# Patient Record
Sex: Female | Born: 1995
Health system: Southern US, Community
[De-identification: ages and names within clinical notes are randomized; demographics above are authoritative.]

## PROBLEM LIST (undated history)

## (undated) ENCOUNTER — Inpatient Hospital Stay: Payer: Self-pay

## (undated) DIAGNOSIS — F1910 Other psychoactive substance abuse, uncomplicated: Secondary | ICD-10-CM

## (undated) DIAGNOSIS — R51 Headache: Secondary | ICD-10-CM

## (undated) DIAGNOSIS — O039 Complete or unspecified spontaneous abortion without complication: Secondary | ICD-10-CM

## (undated) DIAGNOSIS — R519 Headache, unspecified: Secondary | ICD-10-CM

## (undated) HISTORY — DX: Complete or unspecified spontaneous abortion without complication: O03.9

## (undated) HISTORY — DX: Headache: R51

## (undated) HISTORY — DX: Headache, unspecified: R51.9

## (undated) NOTE — *Deleted (*Deleted)
Physician Discharge Summary Note  Patient:  Jill Shepherd is an 62 y.o., female MRN:  [id number] DOB:  06-05-1995 Patient phone:  [phone] (home)  Patient address:   [address] [place] [address],  Total Time spent with patient: {Time; 15 min - 8 hours:17441}  Date of Admission:  12/07/2019 Date of Discharge: 12/23/2019  Reason for Admission:  Mr. Laureano is a  24y.o. female who was admitted to the [facility] unit for psychosis, agitation, suicidal ideations.  Principal Problem: Bipolar 1 disorder, depressed (HCC) Discharge Diagnoses: Principal Problem:   Bipolar 1 disorder, depressed (HCC) Active Problems:   Anxiety   Opiate abuse, continuous (HCC)   Methamphetamine abuse (HCC)   Amphetamine and psychostimulant-induced psychosis with hallucinations (HCC)   Past Psychiatric History: Past history of substance abuse and  bipolar disorder previously treated with seroquel and hydroxyzine. No previous suicide attempts. Multiple rehab treatments.   Past Medical History:  Past Medical History:  Diagnosis Date  . Drug abuse (HCC)   . Headache   . SAB (spontaneous abortion) 01/19/2014   stillborn at 20.4 wks    Past Surgical History:  Procedure Laterality Date  . INCISION AND DRAINAGE ABSCESS Left 05/07/2019   Procedure: INCISION AND DRAINAGE ABSCESS;  Surgeon: Leafy Ro, MD;  Location: [facility] ORS;  Service: General;  Laterality: Left;   Family History:  Family History  Problem Relation Age of Onset  . Hypertension Father   . Hypertension Paternal Grandfather    Family Psychiatric  History: : Mother with alcohol and substance abuse issues. Older brother with alcohol use disorder. Younger brother with polysubstance abuse with several prescription pills  Social History:  Social History   Substance and Sexual Activity  Alcohol Use No  . Alcohol/week: 0.0 standard drinks     Social History   Substance and Sexual Activity  Drug Use Yes  . Types: Other-see  comments, Amphetamines, Fentanyl, Heroin   Comment: subutex tx, snorted heroin last on 5/8    Social History   Socioeconomic History  . Marital status: Legally Separated    Spouse name: Not on file  . Number of children: 1  . Years of education: Not on file  . Highest education level: Not on file  Occupational History    Employer: [employer]  Tobacco Use  . Smoking status: Never Smoker  . Smokeless tobacco: Never Used  Vaping Use  . Vaping Use: Never used  Substance and Sexual Activity  . Alcohol use: No    Alcohol/week: 0.0 standard drinks  . Drug use: Yes    Types: Other-see comments, Amphetamines, Fentanyl, Heroin    Comment: subutex tx, snorted heroin last on 5/8  . Sexual activity: Yes    Partners: Male    Birth control/protection: None  Other Topics Concern  . Not on file  Social History Narrative  . Not on file   Social Determinants of Health   Financial Resource Strain:   . Difficulty of Paying Living Expenses: Not on file  Food Insecurity:   . Worried About Programme researcher, broadcasting/film/video in the Last Year: Not on file  . Ran Out of Food in the Last Year: Not on file  Transportation Needs:   . Lack of Transportation (Medical): Not on file  . Lack of Transportation (Non-Medical): Not on file  Physical Activity:   . Days of Exercise per Week: Not on file  . Minutes of Exercise per Session: Not on file  Stress:   .  Feeling of Stress : Not on file  Social Connections:   . Frequency of Communication with Friends and Family: Not on file  . Frequency of Social Gatherings with Friends and Family: Not on file  . Attends Religious Services: Not on file  . Active Member of Clubs or Organizations: Not on file  . Attends Banker Meetings: Not on file  . Marital Status: Not on file    Hospital Course:  19 year old female who presented to hospital under IVC for acute psychosis, agitation, and verbalization of suicidal ideation to mother. While in the ED, patient was  able to sober and consent for voluntary admission to our unit. On admission she was very labile, tearful, and unable to sleep. She was restarted on Seroquel and this was titrated to 200 mg QHS to assist with mood and insomnia. She also was noted to have restless leg syndrome in the hospital. Iron studies completed and within normal limits. She was started on Requip 0.25 mg nightly which resolved her symptoms. She was also noted to have hepatitis C on admission, and will need to follow-up with infectious disease on an outpatient basis for treatment. At time of discharge she denied suicidal ideations, homicidal ideations, visual hallucinations, and auditory hallucinations. She was accepted to [facility] for a 13-month substance abuse treatment program. Patient, patient's mother, and treatment team felt she was safe to discharge with outpatient follow-up.   Physical Findings: AIMS: Facial and Oral Movements Muscles of Facial Expression: None, normal Lips and Perioral Area: None, normal Jaw: None, normal Tongue: None, normal,Extremity Movements Upper (arms, wrists, hands, fingers): None, normal Lower (legs, knees, ankles, toes): None, normal, Trunk Movements Neck, shoulders, hips: None, normal, Overall Severity Severity of abnormal movements (highest score from questions above): None, normal Incapacitation due to abnormal movements: None, normal Patient's awareness of abnormal movements (rate only patient's report): No Awareness, Dental Status Current problems with teeth and/or dentures?: No Does patient usually wear dentures?: No  CIWA:    COWS:     Musculoskeletal: Strength & Muscle Tone: {desc; muscle tone:32375} Gait & Station: {PE GAIT ED VHQI:69629} Patient leans: {Patient Leans:21022755}  Psychiatric Specialty Exam: Physical Exam  Review of Systems  Blood pressure 98/82, pulse 70, temperature 97.8 F (36.6 C), temperature source Oral, resp. rate 18, height 5\' 1"  (1.549 m), weight 105 kg,  last menstrual period 12/01/2019, SpO2 100 %, unknown if currently breastfeeding.Body mass index is 43.74 kg/m.  General Appearance: {Appearance:22683}  Eye Contact:  {BHH EYE CONTACT:22684}  Speech:  {Speech:22685}  Volume:  {Volume (PAA):22686}  Mood:  {BHH MOOD:22306}  Affect:  {Affect (PAA):22687}  Thought Process:  {Thought Process (PAA):22688}  Orientation:  {BHH ORIENTATION (PAA):22689}  Thought Content:  {Thought Content:22690}  Suicidal Thoughts:  {ST/HT (PAA):22692}  Homicidal Thoughts:  {ST/HT (PAA):22692}  Memory:  {BHH MEMORY:22881}  Judgement:  {Judgement (PAA):22694}  Insight:  {Insight (PAA):22695}  Psychomotor Activity:  {Psychomotor (PAA):22696}  Concentration:  {Concentration:21399}  Recall:  {BHH GOOD/FAIR/POOR:22877}  Fund of Knowledge:  {BHH GOOD/FAIR/POOR:22877}  Language:  {BHH GOOD/FAIR/POOR:22877}  Akathisia:  {BHH YES OR NO:22294}  Handed:  {Handed:22697}  AIMS (if indicated):     Assets:  {Assets (PAA):22698}  ADL's:  {BHH BMW'U:13244}  Cognition:  {chl bhh cognition:304700322}  Sleep:  Number of Hours: 6.25     Have you used any form of tobacco in the last 30 days? (Cigarettes, Smokeless Tobacco, Cigars, and/or Pipes): No  Has this patient used any form of tobacco in the last 30 days? (Cigarettes,  Smokeless Tobacco, Cigars, and/or Pipes) No  Blood Alcohol level:  Lab Results  Component Value Date   ETH <10 12/06/2019   ETH <10 12/01/2019    Metabolic Disorder Labs:  No results found for: HGBA1C, MPG No results found for: PROLACTIN No results found for: CHOL, TRIG, HDL, CHOLHDL, VLDL, LDLCALC  See Psychiatric Specialty Exam and Suicide Risk Assessment completed by Attending Physician prior to discharge.  Discharge destination:  Other:  [facility]  Is patient on multiple antipsychotic therapies at discharge:  No   Has Patient had three or more failed trials of antipsychotic monotherapy by history:  No  Recommended Plan for Multiple  Antipsychotic Therapies: NA   Allergies as of 12/21/2019   No Known Allergies   Med Rec must be completed prior to using this [facility]***       Follow-up Information    [facility] Follow up.   Specialty: Addiction Medicine Contact information: [address] [place] [address] [phone]        [facility] Follow up.   Why: Pt has been reviewed, awaiting bed date. No estimated beddate (12/19/19). Contact information: [address] [place] [address] [phone]        [facility] Follow up.   Contact information: [address] [place], [place] [address] [phone]       [facility] Follow up.   Why: Application sent 12/19/2019 Contact information: [address] [place] [address] [phone]               Follow-up recommendations:  Activity:  as tolerated Diet:  regular diet  Comments: 30 day supply of medications provided at discharge to patient.  Please contact infectious disease at [phone] to schedule appointment for treatment of Hepatitis C infection.   Signed: Jesse Sans, MD 12/21/2019, 1:30 PM

---
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
## 2016-03-30 ENCOUNTER — Emergency Department
Admission: EM | Admit: 2016-03-30 | Discharge: 2016-03-30 | Disposition: A | Discharge status: Home / Self Care | Payer: Medicaid Other | Attending: Emergency Medicine | Admitting: Emergency Medicine

## 2016-03-30 ENCOUNTER — Encounter: Payer: Self-pay | Admitting: Emergency Medicine

## 2016-03-30 DIAGNOSIS — L249 Irritant contact dermatitis, unspecified cause: Secondary | ICD-10-CM

## 2016-03-30 DIAGNOSIS — L243 Irritant contact dermatitis due to cosmetics: Secondary | ICD-10-CM | POA: Insufficient documentation

## 2016-03-30 MED ORDER — TRIAMCINOLONE ACETONIDE 0.025 % EX OINT: 1.0000 "application " | TOPICAL_OINTMENT | Freq: Two times a day (BID) | CUTANEOUS | 0 refills | Status: AC

## 2016-03-30 NOTE — ED Provider Notes (Signed)
[facility] Emergency Department Provider Note  ____________________________________________  Time seen: Approximately 10:42 PM  I have reviewed the triage vital signs and the nursing notes.   HISTORY  Chief Complaint Rash   HPI Jill Shepherd is a 21 y.o. female presenting to the emergency department with a fine macular rash of the skin overlying her chest, abdomen and back. Patient states that she has been tanning in a tanning bed almost daily for the past week. Patient states that she has used a new tanning lotion. Patient denies facial swelling, shortness of breath, chest pain, abdominal pain, nausea and vomiting. She has been afebrile. No recent illness. No alleviating measures have been attempted.   Past Medical History:  Diagnosis Date  . Headache   . SAB (spontaneous abortion) 01/19/2014   stillborn at 20.4 wks    Patient Active Problem List   Diagnosis Date Noted  . Preterm premature rupture of membranes 09/25/2015  . Premature cervical dilation in third trimester 09/07/2015  . Labor and delivery, indication for care 09/07/2015  . Supervision of high risk pregnancy in third trimester 06/06/2015  . History of pregnancy loss in prior pregnancy, currently pregnant in second trimester 05/14/2015  . History of drug use 05/14/2015  . Underweight 05/14/2015    History reviewed. No pertinent surgical history.  Prior to Admission medications   Medication Sig Start Date End Date Taking? Authorizing Provider  norelgestromin-ethinyl estradiol (ORTHO EVRA) 150-35 MCG/24HR transdermal patch Place 1 patch onto the skin once a week. 11/08/15   Hildred LaserAnika Cherry, MD  triamcinolone (KENALOG) 0.025 % ointment Apply 1 application topically 2 (two) times daily. 03/30/16 04/04/16  Orvil FeilJaclyn M Akbar Sacra, PA-C    Allergies Patient has no known allergies.  Family History  Problem Relation Age of Onset  . Hypertension Father   . Hypertension Paternal Grandfather     Social  History Social History  Substance Use Topics  . Smoking status: Never Smoker  . Smokeless tobacco: Never Used  . Alcohol use No    Review of Systems  Constitutional: No fever/chills Eyes: No visual changes. No discharge ENT: No upper respiratory complaints. Cardiovascular: no chest pain. Respiratory: no cough. No SOB. Gastrointestinal: No abdominal pain.  No nausea, no vomiting.  No diarrhea.  No constipation. Musculoskeletal: Negative for musculoskeletal pain. Skin: Patient has rash. Neurological: Negative for headaches, focal weakness or numbness. ____________________________________________   PHYSICAL EXAM:  VITAL SIGNS: ED Triage Vitals  Enc Vitals Group     BP 03/30/16 2054 111/68     Pulse Rate 03/30/16 2054 65     Resp 03/30/16 2054 18     Temp 03/30/16 2054 98.7 F (37.1 C)     Temp Source 03/30/16 2054 Oral     SpO2 03/30/16 2054 100 %     Weight 03/30/16 2054 93 lb (42.2 kg)     Height 03/30/16 2054 5\' 2"  (1.575 m)     Head Circumference --      Peak Flow --      Pain Score 03/30/16 2055 0     Pain Loc --      Pain Edu? --      Excl. in GC? --     Constitutional: Alert and oriented. Well appearing and in no acute distress. Eyes: Conjunctivae are normal. PERRL. EOMI. Head: Atraumatic. ENT:      Mouth/Throat: Mucous membranes are moist. No angioedema or facial swelling. Cardiovascular: Normal rate, regular rhythm. Normal S1 and S2.  Good peripheral circulation.  Respiratory: Normal respiratory effort without tachypnea or retractions. Lungs CTAB. Good air entry to the bases with no decreased or absent breath sounds. Gastrointestinal: Bowel sounds 4 quadrants. Soft and nontender to palpation. No guarding or rigidity. No palpable masses. No distention. No CVA tenderness. Musculoskeletal: Full range of motion to all extremities. No gross deformities appreciated. Neurologic:  Normal speech and language. No gross focal neurologic deficits are appreciated.  Skin:  Patient has a fine, macular rash of the skin overlying the chest, abdomen and back. Psychiatric: Mood and affect are normal. Speech and behavior are normal. Patient exhibits appropriate insight and judgement. ___________________________________________   LABS (all labs ordered are listed, but only abnormal results are displayed)  Labs Reviewed - No data to display ____________________________________________  EKG   ____________________________________________  RADIOLOGY Geraldo Pitter, personally viewed and evaluated these images (plain radiographs) as part of my medical decision making, as well as reviewing the written report by the radiologist.  No results found.  ____________________________________________    PROCEDURES  Procedure(s) performed:    Procedures    Medications - No data to display   ____________________________________________   INITIAL IMPRESSION / ASSESSMENT AND PLAN / ED COURSE  Pertinent labs & imaging results that were available during my care of the patient were reviewed by me and considered in my medical decision making (see chart for details).  Review of the [place] CSRS was performed in accordance of the [facility] prior to dispensing any controlled drugs.     Contact dermatitis: Assessment and Plan:  Patient presents to the emergency department with a fine, macular rash of the skin overlying the chest, abdomen and back. I suspect contact dermatitis from tanning bed exposure/tanning lotion. Patient was discharged with triamcinolone cream. Vital signs are reassuring at this time. All patient questions were answered. ____________________________________________  FINAL CLINICAL IMPRESSION(S) / ED DIAGNOSES  Final diagnoses:  Irritant contact dermatitis, unspecified trigger      NEW MEDICATIONS STARTED DURING THIS VISIT:  New Prescriptions   TRIAMCINOLONE (KENALOG) 0.025 % OINTMENT    Apply 1 application topically 2 (two) times daily.         This chart was dictated using voice recognition software/Dragon. Despite best efforts to proofread, errors can occur which can change the meaning. Any change was purely unintentional.    Orvil Feil, PA-C 03/30/16 2252    Phineas Semen, MD 03/30/16 2257

## 2016-03-30 NOTE — ED Notes (Signed)
See triage note. Pt c/o itchy rash following tanning salon visit to chest, abdomen, and back. Pt thinks it may be from a new tanning oil. No resp distress.

## 2016-03-30 NOTE — ED Notes (Signed)

## 2016-03-30 NOTE — ED Triage Notes (Signed)
Pt ambulatory to triage with no difficulty. Pt reports she has noticed a worsening rash over the last few weeks since she started using a new coconut oil at the tanning bed. Pt talking in full and complete sentences with no difficulty.

## 2016-05-22 ENCOUNTER — Encounter: Payer: Self-pay | Admitting: *Deleted

## 2016-05-22 ENCOUNTER — Emergency Department
Admission: EM | Admit: 2016-05-22 | Discharge: 2016-05-22 | Disposition: A | Discharge status: Home / Self Care | Payer: Self-pay | Attending: Student in an Organized Health Care Education/Training Program | Admitting: Student in an Organized Health Care Education/Training Program

## 2016-05-22 ENCOUNTER — Emergency Department: Payer: Self-pay

## 2016-05-22 DIAGNOSIS — Z3A01 Less than 8 weeks gestation of pregnancy: Secondary | ICD-10-CM | POA: Insufficient documentation

## 2016-05-22 DIAGNOSIS — O200 Threatened abortion: Secondary | ICD-10-CM | POA: Insufficient documentation

## 2016-05-22 DIAGNOSIS — N939 Abnormal uterine and vaginal bleeding, unspecified: Secondary | ICD-10-CM

## 2016-05-22 LAB — URINALYSIS, COMPLETE (UACMP) WITH MICROSCOPIC
BACTERIA UA: NONE SEEN
Bilirubin Urine: NEGATIVE
Glucose, UA: NEGATIVE mg/dL
Ketones, ur: NEGATIVE mg/dL
Leukocytes, UA: NEGATIVE
Nitrite: NEGATIVE
PH: 5 (ref 5.0–8.0)
PROTEIN: NEGATIVE mg/dL
Specific Gravity, Urine: 1.017 (ref 1.005–1.030)

## 2016-05-22 LAB — BASIC METABOLIC PANEL
Anion gap: 8 (ref 5–15)
BUN: 15 mg/dL (ref 6–20)
CALCIUM: 9.7 mg/dL (ref 8.9–10.3)
CO2: 24 mmol/L (ref 22–32)
CREATININE: 0.69 mg/dL (ref 0.44–1.00)
Chloride: 106 mmol/L (ref 101–111)
GFR calc Af Amer: >60 mL/min (ref >60)
GLUCOSE: 130 mg/dL — AB (ref 65–99)
POTASSIUM: 4.2 mmol/L (ref 3.5–5.1)
SODIUM: 138 mmol/L (ref 135–145)

## 2016-05-22 LAB — CBC WITH DIFFERENTIAL/PLATELET
Basophils Absolute: 0 10*3/uL (ref 0–0.1)
Basophils Relative: 0 %
EOS ABS: 0.1 10*3/uL (ref 0–0.7)
EOS PCT: 1 %
HCT: 37.8 % (ref 35.0–47.0)
Hemoglobin: 13.2 g/dL (ref 12.0–16.0)
LYMPHS ABS: 1.6 10*3/uL (ref 1.0–3.6)
Lymphocytes Relative: 23 %
MCH: 29.7 pg (ref 26.0–34.0)
MCHC: 34.9 g/dL (ref 32.0–36.0)
MCV: 85 fL (ref 80.0–100.0)
MONO ABS: 0.4 10*3/uL (ref 0.2–0.9)
Monocytes Relative: 6 %
Neutro Abs: 4.7 10*3/uL (ref 1.4–6.5)
Neutrophils Relative %: 70 %
PLATELETS: 180 10*3/uL (ref 150–440)
RBC: 4.45 MIL/uL (ref 3.80–5.20)
RDW: 13.2 % (ref 11.5–14.5)
WBC: 6.7 10*3/uL (ref 3.6–11.0)

## 2016-05-22 LAB — POCT PREGNANCY, URINE: Preg Test, Ur: POSITIVE — AB

## 2016-05-22 LAB — HCG, QUANTITATIVE, PREGNANCY: HCG, BETA CHAIN, QUANT, S: 317 m[IU]/mL — AB (ref <5)

## 2016-05-22 MED ORDER — ACETAMINOPHEN 500 MG PO TABS
1000.0000 mg | ORAL_TABLET | Freq: Once | ORAL | Status: AC
  Administered 2016-05-22: 1000 mg via ORAL
  Filled 2016-05-22: qty 2

## 2016-05-22 NOTE — ED Notes (Signed)
Pt states she is 3-[redacted] weeks pregnant and began spotting a week ago, states she is "high risk" for pregnancy due to 2 pre-term deliveries in the past, awake and alert in no acute distress

## 2016-05-22 NOTE — ED Notes (Signed)
Pt discharged to home.  Family member driving.  Discharge instructions reviewed.  Verbalized understanding.  No questions or concerns at this time.  Teach back verified.  Pt in NAD.  No items left in ED.   

## 2016-05-22 NOTE — ED Provider Notes (Signed)
[facility] Emergency Department Provider Note    First MD Initiated Contact with Patient 05/22/16 1620     (approximate)  I have reviewed the triage vital signs and the nursing notes.   HISTORY  Chief Complaint Vaginal Bleeding    HPI Jill Shepherd is a 21 y.o. female G2P0201 presents with vaginal spotting for roughly 5-6 days with increase in bleeding and passing clots today. She states that she's roughly 3-[redacted] weeks pregnant by last menstrual period. Denies any severe pain.  Has not felt lightheaded or that she was about to pass out. Has not noted any products of conception. It was told she is a high risk for pregnancy because she's had 2 preterm deliveries.      Past Medical History:  Diagnosis Date  . Headache   . SAB (spontaneous abortion) 01/19/2014   stillborn at 20.4 wks   Family History  Problem Relation Age of Onset  . Hypertension Father   . Hypertension Paternal Grandfather    No past surgical history on file. Patient Active Problem List   Diagnosis Date Noted  . Preterm premature rupture of membranes 09/25/2015  . Premature cervical dilation in third trimester 09/07/2015  . Labor and delivery, indication for care 09/07/2015  . Supervision of high risk pregnancy in third trimester 06/06/2015  . History of pregnancy loss in prior pregnancy, currently pregnant in second trimester 05/14/2015  . History of drug use 05/14/2015  . Underweight 05/14/2015      Prior to Admission medications   Medication Sig Start Date End Date Taking? Authorizing Provider  norelgestromin-ethinyl estradiol (ORTHO EVRA) 150-35 MCG/24HR transdermal patch Place 1 patch onto the skin once a week. 11/08/15   Hildred Laser, MD    Allergies Patient has no known allergies.    Social History Social History  Substance Use Topics  . Smoking status: Never Smoker  . Smokeless tobacco: Never Used  . Alcohol use No    Review of Systems Patient denies  headaches, rhinorrhea, blurry vision, numbness, shortness of breath, chest pain, edema, cough, abdominal pain, nausea, vomiting, diarrhea, dysuria, fevers, rashes or hallucinations unless otherwise stated above in HPI. ____________________________________________   PHYSICAL EXAM:  VITAL SIGNS: Vitals:   05/22/16 1514  BP: 126/85  Pulse: 81  Resp: 18  Temp: 98.4 F (36.9 C)    Constitutional: Alert and oriented.tearful but in no acute distress. Eyes: Conjunctivae are normal. PERRL. EOMI. Head: Atraumatic. Nose: No congestion/rhinnorhea. Mouth/Throat: Mucous membranes are moist.  Oropharynx non-erythematous. Neck: No stridor. Painless ROM. No cervical spine tenderness to palpation Hematological/Lymphatic/Immunilogical: No cervical lymphadenopathy. Cardiovascular: Normal rate, regular rhythm. Grossly normal heart sounds.  Good peripheral circulation. Respiratory: Normal respiratory effort.  No retractions. Lungs CTAB. Gastrointestinal: Soft and nontender. No distention. No abdominal bruits. No CVA tenderness. Genitourinary: open cervical os with no POC visualized.  Small volume blood in the vaginal vault. Musculoskeletal: No lower extremity tenderness nor edema.  No joint effusions. Neurologic:  Normal speech and language. No gross focal neurologic deficits are appreciated. No gait instability. Skin:  Skin is warm, dry and intact. No rash noted. Psychiatric: Mood and affect are normal. Speech and behavior are normal.  ____________________________________________   LABS (all labs ordered are listed, but only abnormal results are displayed)  Results for orders placed or performed during the hospital encounter of 05/22/16 (from the past 24 hour(s))  CBC with Differential     Status: None   Collection Time: 05/22/16  3:15 PM  Result Value  Ref Range   WBC 6.7 3.6 - 11.0 K/uL   RBC 4.45 3.80 - 5.20 MIL/uL   Hemoglobin 13.2 12.0 - 16.0 g/dL   HCT 16.1 09.6 - 04.5 %   MCV 85.0 80.0  - 100.0 fL   MCH 29.7 26.0 - 34.0 pg   MCHC 34.9 32.0 - 36.0 g/dL   RDW 40.9 81.1 - 91.4 %   Platelets 180 150 - 440 K/uL   Neutrophils Relative % 70 %   Neutro Abs 4.7 1.4 - 6.5 K/uL   Lymphocytes Relative 23 %   Lymphs Abs 1.6 1.0 - 3.6 K/uL   Monocytes Relative 6 %   Monocytes Absolute 0.4 0.2 - 0.9 K/uL   Eosinophils Relative 1 %   Eosinophils Absolute 0.1 0 - 0.7 K/uL   Basophils Relative 0 %   Basophils Absolute 0.0 0 - 0.1 K/uL  Basic metabolic panel     Status: Abnormal   Collection Time: 05/22/16  3:15 PM  Result Value Ref Range   Sodium 138 135 - 145 mmol/L   Potassium 4.2 3.5 - 5.1 mmol/L   Chloride 106 101 - 111 mmol/L   CO2 24 22 - 32 mmol/L   Glucose, Bld 130 (H) 65 - 99 mg/dL   BUN 15 6 - 20 mg/dL   Creatinine, Ser 7.82 0.44 - 1.00 mg/dL   Calcium 9.7 8.9 - 95.6 mg/dL   GFR calc non Af Amer >60 >60 mL/min   GFR calc Af Amer >60 >60 mL/min   Anion gap 8 5 - 15  Urinalysis, Complete w Microscopic     Status: Abnormal   Collection Time: 05/22/16  3:22 PM  Result Value Ref Range   Color, Urine YELLOW (A) YELLOW   APPearance CLEAR (A) CLEAR   Specific Gravity, Urine 1.017 1.005 - 1.030   pH 5.0 5.0 - 8.0   Glucose, UA NEGATIVE NEGATIVE mg/dL   Hgb urine dipstick SMALL (A) NEGATIVE   Bilirubin Urine NEGATIVE NEGATIVE   Ketones, ur NEGATIVE NEGATIVE mg/dL   Protein, ur NEGATIVE NEGATIVE mg/dL   Nitrite NEGATIVE NEGATIVE   Leukocytes, UA NEGATIVE NEGATIVE   RBC / HPF 0-5 0 - 5 RBC/hpf   WBC, UA 0-5 0 - 5 WBC/hpf   Bacteria, UA NONE SEEN NONE SEEN   Squamous Epithelial / LPF 0-5 (A) NONE SEEN  hCG, quantitative, pregnancy     Status: Abnormal   Collection Time: 05/22/16  3:22 PM  Result Value Ref Range   hCG, Beta Chain, Quant, S 317 (H) <5 mIU/mL  Pregnancy, urine POC     Status: Abnormal   Collection Time: 05/22/16  3:25 PM  Result Value Ref Range   Preg Test, Ur POSITIVE (A) NEGATIVE    ____________________________________________  EKG ____________________________________________  RADIOLOGY  I personally reviewed all radiographic images ordered to evaluate for the above acute complaints and reviewed radiology reports and findings.  These findings were personally discussed with the patient.  Please see medical record for radiology report.  ____________________________________________   PROCEDURES  Procedure(s) performed:  Procedures    Critical Care performed: no ____________________________________________   INITIAL IMPRESSION / ASSESSMENT AND PLAN / ED COURSE  Pertinent labs & imaging results that were available during my care of the patient were reviewed by me and considered in my medical decision making (see chart for details).  DDX: ectopic, miscarriage, subchorionic hemorrhage  Gudrun Axe Tyree is a 21 y.o. who presents to the ED with vaginal bleeding and pregnancy. Patient  is AFVSS in ED. Exam as above. Given current presentation have considered the above differential.  Ultrasound ordered to evaluate for evidence of ectopic shows no identifiable intrauterine pregnancy. No evidence of ectopic. Will perform pelvic exam to evaluate for evidence of open cervical os or products of conception.  Clinical Course as of May 23 1910  Thu May 22, 2016  1908 Pelvic exam with open cervical os. There is no proximal of conception in the vault or the os suggesting a miscarriage. Discussed need for follow-up for repeat hCG and follow-up with OB/GYN.  Have discussed with the patient and available family all diagnostics and treatments performed thus far and all questions were answered to the best of my ability. The patient demonstrates understanding and agreement with plan.   [PR]    Clinical Course User Index [PR] Willy Eddy, MD     ____________________________________________   FINAL CLINICAL IMPRESSION(S) / ED DIAGNOSES  Final diagnoses:  Vaginal  bleeding  Threatened miscarriage      NEW MEDICATIONS STARTED DURING THIS VISIT:  New Prescriptions   No medications on file     Note:  This document was prepared using Dragon voice recognition software and may include unintentional dictation errors.    Willy Eddy, MD 05/22/16 [phone]

## 2016-05-22 NOTE — ED Triage Notes (Signed)
Pt reports vaginal spotting several days and now heavy vaginal bleeding.  Pt had 5 positive home pregnancy test.  Pt has not seen OB.  No urinary sx.   Pt alert

## 2016-05-22 NOTE — Discharge Instructions (Signed)
Please follow up with OB/Gyn.  Return for worsening pain or heavy bleeding where you are going through more than 4 tampons or pads per hour for more than 4 hours.

## 2016-06-24 ENCOUNTER — Ambulatory Visit (INDEPENDENT_AMBULATORY_CARE_PROVIDER_SITE_OTHER): Payer: Self-pay | Admitting: Obstetrics and Gynecology

## 2016-06-24 ENCOUNTER — Encounter: Payer: Self-pay | Admitting: Obstetrics and Gynecology

## 2016-06-24 VITALS — BP 92/51 | HR 62 | Ht 62.0 in | Wt 91.2 lb

## 2016-06-24 DIAGNOSIS — Z3202 Encounter for pregnancy test, result negative: Secondary | ICD-10-CM

## 2016-06-24 DIAGNOSIS — N926 Irregular menstruation, unspecified: Secondary | ICD-10-CM

## 2016-06-24 DIAGNOSIS — Z8759 Personal history of other complications of pregnancy, childbirth and the puerperium: Secondary | ICD-10-CM

## 2016-06-24 NOTE — Progress Notes (Signed)
    GYNECOLOGY CLINIC PROGRESS NOTE  Subjective:    Jill Shepherd is a 21 y.o. 603P0210 female who presents for evaluation of amenorrhea. She believes she could be pregnant. Pregnancy is desired. Sexual Activity: single partner, contraception: none. Current symptoms also include: pelvic cramping, breast tenderness, fatigue, nausea and positive home pregnancy test (patient reports 5 positive tests, with most recent test being 1-2 days ago) . Last period was abnormal.  Patient notes that last cycle was sometime during the 1st week of March.  Had bleeding towards the end of March into early April but patient notes she thinks she had a miscarriage around this time (was approximately 3-[redacted] weeks pregnant).  Did not have a cycle in April, or May.    No LMP recorded (lmp unknown). Sometime in 1st week of March.  Contraception: Previously on Ortho Evra patch (however has not been on these for several months)  The following portions of the patient's history were reviewed and updated as appropriate: allergies, current medications, past family history, past medical history, past social history, past surgical history and problem list.  Review of Systems Pertinent items noted in HPI and remainder of comprehensive ROS otherwise negative.      Objective:    BP (!) 92/51   Pulse 62   Ht 5\' 2"  (1.575 m)   Wt 91 lb 3 oz (41.4 kg)   LMP  (LMP Unknown)   BMI 16.68 kg/m  General: alert, no distress and no acute distress    Lab Review Urine HCG: negative    Assessment:    Absence of menstruation.     Plan:   Pregnancy Test: Negative.  Patient notes recent positive home pregnancy test.  Will get serum BHCG.  Discussed that this could be a new early pregnancy vs remaining HCG levels from previous miscarriage.  Will repeat HCG in 2 days to follow trend.  Advised on Tylenol prn and warm compress for cramping.    Hildred Laserherry, Odalys Win, MD [facility]

## 2016-06-24 NOTE — Progress Notes (Signed)
Pt states she had 5 home positive pregnancy test. Had a miscarriage about a month ago. UPT neg

## 2016-06-25 ENCOUNTER — Telehealth: Payer: Self-pay | Admitting: Obstetrics and Gynecology

## 2016-06-25 LAB — POCT URINE PREGNANCY: Preg Test, Ur: NEGATIVE

## 2016-06-25 LAB — BETA HCG QUANT (REF LAB): hCG Quant: 2 m[IU]/mL

## 2016-06-25 NOTE — Telephone Encounter (Signed)
Patient called requesting beta results from yesterday. Thanks °

## 2016-06-25 NOTE — Telephone Encounter (Signed)
Called pt informed her of negative beta results. Pt gave verbal understanding

## 2016-06-26 ENCOUNTER — Other Ambulatory Visit: Payer: Self-pay

## 2016-11-09 ENCOUNTER — Emergency Department
Admission: EM | Admit: 2016-11-09 | Discharge: 2016-11-09 | Disposition: A | Discharge status: Home / Self Care | Payer: Self-pay | Attending: Emergency Medicine | Admitting: Emergency Medicine

## 2016-11-09 ENCOUNTER — Emergency Department: Payer: Self-pay

## 2016-11-09 ENCOUNTER — Encounter: Payer: Self-pay | Admitting: *Deleted

## 2016-11-09 DIAGNOSIS — Z8759 Personal history of other complications of pregnancy, childbirth and the puerperium: Secondary | ICD-10-CM | POA: Insufficient documentation

## 2016-11-09 DIAGNOSIS — Z79899 Other long term (current) drug therapy: Secondary | ICD-10-CM | POA: Insufficient documentation

## 2016-11-09 DIAGNOSIS — R1030 Lower abdominal pain, unspecified: Secondary | ICD-10-CM | POA: Insufficient documentation

## 2016-11-09 LAB — COMPREHENSIVE METABOLIC PANEL
ALT: 15 U/L (ref 14–54)
AST: 27 U/L (ref 15–41)
Albumin: 4.9 g/dL (ref 3.5–5.0)
Alkaline Phosphatase: 64 U/L (ref 38–126)
Anion gap: 9 (ref 5–15)
BUN: 17 mg/dL (ref 6–20)
CHLORIDE: 103 mmol/L (ref 101–111)
CO2: 27 mmol/L (ref 22–32)
CREATININE: 0.82 mg/dL (ref 0.44–1.00)
Calcium: 9.7 mg/dL (ref 8.9–10.3)
GFR calc non Af Amer: >60 mL/min (ref >60)
Glucose, Bld: 70 mg/dL (ref 65–99)
Potassium: 3.6 mmol/L (ref 3.5–5.1)
SODIUM: 139 mmol/L (ref 135–145)
Total Bilirubin: 1 mg/dL (ref 0.3–1.2)
Total Protein: 8.1 g/dL (ref 6.5–8.1)

## 2016-11-09 LAB — URINALYSIS, COMPLETE (UACMP) WITH MICROSCOPIC
BILIRUBIN URINE: NEGATIVE
Bacteria, UA: NONE SEEN
GLUCOSE, UA: NEGATIVE mg/dL
HGB URINE DIPSTICK: NEGATIVE
KETONES UR: NEGATIVE mg/dL
Leukocytes, UA: NEGATIVE
Nitrite: NEGATIVE
PH: 5 (ref 5.0–8.0)
Protein, ur: NEGATIVE mg/dL
Specific Gravity, Urine: 1.026 (ref 1.005–1.030)

## 2016-11-09 LAB — CBC
HCT: 40.1 % (ref 35.0–47.0)
Hemoglobin: 13.7 g/dL (ref 12.0–16.0)
MCH: 29.3 pg (ref 26.0–34.0)
MCHC: 34.2 g/dL (ref 32.0–36.0)
MCV: 85.6 fL (ref 80.0–100.0)
PLATELETS: 196 10*3/uL (ref 150–440)
RBC: 4.69 MIL/uL (ref 3.80–5.20)
RDW: 12.8 % (ref 11.5–14.5)
WBC: 7.1 10*3/uL (ref 3.6–11.0)

## 2016-11-09 LAB — PREGNANCY, URINE: Preg Test, Ur: NEGATIVE

## 2016-11-09 LAB — LIPASE, BLOOD: LIPASE: 28 U/L (ref 11–51)

## 2016-11-09 NOTE — ED Provider Notes (Signed)
[facility] Emergency Department Provider Note  ____________________________________________   First MD Initiated Contact with Patient 11/09/16 1744     (approximate)  I have reviewed the triage vital signs and the nursing notes.   HISTORY  Chief Complaint Abdominal Pain   HPI Jill Shepherd is a 21 y.o. female with a history of a miscarriage who is presenting to the emergency department for abdominal pain over the past 1-2 days. She says that she feels "knots" in her abdomen when she is having severe episodes of the pain. She says that did not appear to be moving to different parts of her abdomen and she is concerned about hernia. She says that she has been nauseous but without any vomiting. Denies any diarrhea. Denies any vaginal discharge or bleeding. Reports pain is only minimal at this time and says the pain can come and go.does not report any radiation of the pain.   Past Medical History:  Diagnosis Date  . Headache   . SAB (spontaneous abortion) 01/19/2014   stillborn at 20.4 wks    Patient Active Problem List   Diagnosis Date Noted  . Preterm premature rupture of membranes 09/25/2015  . Premature cervical dilation in third trimester 09/07/2015  . Labor and delivery, indication for care 09/07/2015  . Supervision of high risk pregnancy in third trimester 06/06/2015  . History of pregnancy loss in prior pregnancy, currently pregnant in second trimester 05/14/2015  . History of drug use 05/14/2015  . Underweight 05/14/2015    History reviewed. No pertinent surgical history.  Prior to Admission medications   Medication Sig Start Date End Date Taking? Authorizing Provider  norelgestromin-ethinyl estradiol (ORTHO EVRA) 150-35 MCG/24HR transdermal patch Place 1 patch onto the skin once a week. Patient not taking: Reported on 06/24/2016 11/08/15   Hildred Laser, MD    Allergies Patient has no known allergies.  Family History  Problem Relation  Age of Onset  . Hypertension Father   . Hypertension Paternal Grandfather     Social History Social History  Substance Use Topics  . Smoking status: Never Smoker  . Smokeless tobacco: Never Used  . Alcohol use No    Review of Systems  Constitutional: No fever/chills Eyes: No visual changes. ENT: No sore throat. Cardiovascular: Denies chest pain. Respiratory: Denies shortness of breath. Gastrointestinal:  no vomiting.  No diarrhea.  No constipation. Genitourinary: Negative for dysuria. Musculoskeletal: Negative for back pain. Skin: Negative for rash. Neurological: Negative for headaches, focal weakness or numbness.   ____________________________________________   PHYSICAL EXAM:  VITAL SIGNS: ED Triage Vitals  Enc Vitals Group     BP 11/09/16 1603 107/72     Pulse Rate 11/09/16 1603 76     Resp 11/09/16 1603 16     Temp 11/09/16 1603 98.3 F (36.8 C)     Temp Source 11/09/16 1603 Oral     SpO2 11/09/16 1603 100 %     Weight 11/09/16 1604 97 lb (44 kg)     Height 11/09/16 1604  (1.575 m)     Head Circumference --      Peak Flow --      Pain Score 11/09/16 1603 10     Pain Loc --      Pain Edu? --      Excl. in GC? --     Constitutional: Alert and oriented. Well appearing and in no acute distress. Eyes: Conjunctivae are normal.  Head: Atraumatic. Nose: No congestion/rhinnorhea. Mouth/Throat: Mucous membranes  are moist.  Neck: No stridor.   Cardiovascular: Normal rate, regular rhythm. Grossly normal heart sounds.   Respiratory: Normal respiratory effort.  No retractions. Lungs CTAB. Gastrointestinal: Soft with minimal tenderness to palpation across the lower abdomen. No distention. No CVA tenderness. Musculoskeletal: No lower extremity tenderness nor edema.  No joint effusions. Neurologic:  Normal speech and language. No gross focal neurologic deficits are appreciated. Skin:  Skin is warm, dry and intact. No rash noted. Psychiatric: Mood and affect are  normal. Speech and behavior are normal.  ____________________________________________   LABS (all labs ordered are listed, but only abnormal results are displayed)  Labs Reviewed  URINALYSIS, COMPLETE (UACMP) WITH MICROSCOPIC - Abnormal; Notable for the following:       Result Value   Color, Urine YELLOW (*)    APPearance CLEAR (*)    Squamous Epithelial / LPF 0-5 (*)    All other components within normal limits  LIPASE, BLOOD  COMPREHENSIVE METABOLIC PANEL  CBC  PREGNANCY, URINE   ____________________________________________  EKG   ____________________________________________  RADIOLOGY  no acute findings on theultrasoundof the pelvis. ____________________________________________   PROCEDURES  Procedure(s) performed:   Procedures  Critical Care performed:   ____________________________________________   INITIAL IMPRESSION / ASSESSMENT AND PLAN / ED COURSE  Pertinent labs & imaging results that were available during my care of the patient were reviewed by me and considered in my medical decision making (see chart for details).  Differential diagnosis includes, but is not limited to, ovarian cyst, ovarian torsion, acute appendicitis, diverticulitis, urinary tract infection/pyelonephritis, endometriosis, bowel obstruction, colitis, renal colic, gastroenteritis, hernia, pregnancy related pain including ectopic pregnancy, etc.  As part of my medical decision making, I reviewed the following data within the electronic MEDICAL RECORD NUMBER Notes from prior ED visits   ----------------------------------------- 7:32 PM on 11/09/2016 -----------------------------------------  Patient without any distress at this time. Very reassuring workup. I did discuss with her pelvic exam the patient would not like one at this time. She has a low suspicion for STDs or other vaginal infections. Continues to deny any discharge. Also the patient stand up and did not observe any hernia  sacs. She says that she is able to follow up with her OB/GYN in the office. The patient be discharged at this time. She is understanding the plan one to comply. We also discussed return precautions. We especially discussed worsening pain or progression to nausea and vomiting. Also we discussed returning for pain the right lower quadrant. I reviewed her abdomen and she continues to be mild to moderately tender across the lower abdomen without any focal tenderness to palpation. Normal blood cell count. No focal tenderness to palpation. Less likely to be acute appendicitis. However, we did discuss the need for imaging if the pain were to become localized to the right lower quadrant.       ____________________________________________   FINAL CLINICAL IMPRESSION(S) / ED DIAGNOSES  Final diagnoses:  Lower abdominal pain  Lower abdominal pain      NEW MEDICATIONS STARTED DURING THIS VISIT:  New Prescriptions   No medications on file     Note:  This document was prepared using Dragon voice recognition software and may include unintentional dictation errors.     Myrna Blazer, MD 11/09/16 [phone]

## 2016-11-09 NOTE — ED Triage Notes (Signed)
Pt to ED reporting generalized abd pain and verbalized, "I feel like there are lumps moving around in there." Pt has a hx o f miscarriages and reports they have been attempting to get pregnant. Pt took a preg test yesterday that was negative but pt has been nauseous for the past week and reports pain is now at a  8/10 pain. No changes in BM or urination reported. No fevers reported. No vaginal bleeding

## 2016-11-14 ENCOUNTER — Telehealth: Payer: Self-pay | Admitting: Obstetrics and Gynecology

## 2016-11-14 NOTE — Telephone Encounter (Signed)
Pt is aware that Dr. Valentino Saxonherry is not in the office and will be on vacation next week, will need to follow up with pt to see about her coming in to see if another physician is willing to write letter. Pt states she is due for a follow up appt any ways due to her most recent hospital visit

## 2016-11-14 NOTE — Telephone Encounter (Signed)
Patient called wanting to speak with Dr. Valentino Saxonherry wanting her to write a letter stating that her dog is an emotional support dog in order for her pet dog to stay in her apartment complex. No other information was disclosed other than wanting to speak with Dr. Valentino Saxonherry. Please advise.

## 2016-11-17 NOTE — Telephone Encounter (Signed)
Left message on machine to see if pt is ok coming for an appt with Dr. Algis Downs for E.R follow up and discuss letter for pet

## 2016-11-25 NOTE — Telephone Encounter (Signed)
Left message on machine to call back. Per Dr. Valentino Saxonherry, she can not write letter, she states she would need to evaulated and we can place a referral for her to see a physiatrist if she would like

## 2016-11-28 ENCOUNTER — Encounter: Payer: Self-pay | Admitting: Obstetrics and Gynecology

## 2016-11-28 ENCOUNTER — Ambulatory Visit (INDEPENDENT_AMBULATORY_CARE_PROVIDER_SITE_OTHER): Payer: Self-pay | Admitting: Obstetrics and Gynecology

## 2016-11-28 VITALS — BP 108/54 | HR 102 | Ht 62.0 in | Wt 96.9 lb

## 2016-11-28 DIAGNOSIS — O09291 Supervision of pregnancy with other poor reproductive or obstetric history, first trimester: Secondary | ICD-10-CM

## 2016-11-28 DIAGNOSIS — Z8751 Personal history of pre-term labor: Secondary | ICD-10-CM

## 2016-11-28 DIAGNOSIS — O99321 Drug use complicating pregnancy, first trimester: Secondary | ICD-10-CM

## 2016-11-28 DIAGNOSIS — O99320 Drug use complicating pregnancy, unspecified trimester: Secondary | ICD-10-CM

## 2016-11-28 DIAGNOSIS — Z3200 Encounter for pregnancy test, result unknown: Secondary | ICD-10-CM | POA: Insufficient documentation

## 2016-11-28 DIAGNOSIS — R636 Underweight: Secondary | ICD-10-CM

## 2016-11-28 DIAGNOSIS — O09299 Supervision of pregnancy with other poor reproductive or obstetric history, unspecified trimester: Secondary | ICD-10-CM

## 2016-11-28 DIAGNOSIS — F1120 Opioid dependence, uncomplicated: Secondary | ICD-10-CM

## 2016-11-28 DIAGNOSIS — Z8759 Personal history of other complications of pregnancy, childbirth and the puerperium: Secondary | ICD-10-CM

## 2016-11-28 DIAGNOSIS — Z3201 Encounter for pregnancy test, result positive: Secondary | ICD-10-CM

## 2016-11-28 DIAGNOSIS — Z8742 Personal history of other diseases of the female genital tract: Secondary | ICD-10-CM

## 2016-11-28 LAB — POCT URINE PREGNANCY: PREG TEST UR: POSITIVE — AB

## 2016-11-28 NOTE — Progress Notes (Signed)
    GYNECOLOGY CLINIC PROGRESS NOTE  Subjective:    Jill Shepherd is a 21 y.o. [phone] female who presents for evaluation of amenorrhea. She believes she could be pregnant. Pregnancy is desired. Sexual Activity: single partner, contraception: none. Current symptoms also include: nausea (mild) and positive home pregnancy test. Last period was normal.  Patient's last menstrual period was 10/19/2016 (exact date).  The following portions of the patient's history were reviewed and updated as appropriate: allergies, current medications, past family history, past medical history, past social history, past surgical history and problem list.  Review of Systems Pertinent items noted in HPI and remainder of comprehensive ROS otherwise negative.     Objective:    BP (!) 108/54   Pulse (!) 102   Ht 5\' 2"  (1.575 m)   Wt 96 lb 14.4 oz (44 kg)   LMP 10/19/2016 (Exact Date)   BMI 17.72 kg/m  General: alert, no distress and no acute distress    Lab Review Urine HCG: positive    Assessment:   Absence of menstruation.   Encounter for pregnancy test, result positive History of cervical incompetence History of preterm premature rupture of membranes (PPROM) History of preterm delivery History of pregnancy loss in prior pregnancy, currently pregnant in first trimester  History of miscarriage, currently pregnant  Underweight Suboxone maintenance treatment complicating pregnancy, antepartum, first trimester  Plan:   - Pregnancy Test: Positive: EDC: 07/26/2016, with EGA 5.5 weeks. Briefly discussed pre-natal care options. Pregnancy, Childbirth and the Newborn book given. Encouraged well-balanced diet, plenty of rest when needed, pre-natal vitamins daily and walking for exercise. Discussed self-help for nausea, avoiding OTC medications until consulting provider or pharmacist, other than Tylenolas needed, minimal caffeine (1-2 cups daily) and avoiding alcohol. She will schedule her initial OB visit in  the next month, and will have a NOB intake in 2 weeks.  - History of cervical incompetency - patient with painless cervical dilation beginning at [redacted] weeks gestation.  Was 4 cm by [redacted] weeks gestation.  Discussed need for cervical length at 14-16 weeks, with placement of cerclage if incompetency noted.   - History of PPROM at 32 weeks, with subsequent onset of labor and delivery.  Discussed option of initiating 17-OHP for h/o preterm delivery.  Discussed risks vs benefits.  Patient notes she would like to use, as she was on this for her prior pregnancy as well due to h/o second trimester loss. Will order when gestational age appropriate.  - History of pregnancy loss with first pregnancy at [redacted] weeks gestation (preterm labor).  Patient understands she is a high risk pregnancy.  Notes desire for 17-OHP use during current pregnancy.  Also with h/o miscarriage in 1st trimester last pregnancy.  Will order ultrasound for dating/viability.  - Suboxone use, currently pregnant.  Currently taking 2 mg daily. Discussed plans for use of medication during pregnancy.  It is advisable that patient weans off medication during the pregnancy, however if she does not, discussed risks of neonatal abstinence syndrome and need for extended inpatient monitoring after birth.  -  Underweight, will need to monitor weight gain this pregnancy, supplement as needed.    A total of 15 minutes were spent face-to-face with the patient during this encounter and over half of that time involved counseling and coordination of care.   Hildred Laserherry, Dex Blakely, MD [facility]

## 2016-12-01 NOTE — Telephone Encounter (Signed)
Looks like pt came in for an appt on Nov. 2. Will close this message.

## 2016-12-12 ENCOUNTER — Ambulatory Visit: Payer: Self-pay | Admitting: Obstetrics and Gynecology

## 2016-12-12 ENCOUNTER — Encounter: Payer: Self-pay | Admitting: Obstetrics and Gynecology

## 2016-12-12 ENCOUNTER — Ambulatory Visit (INDEPENDENT_AMBULATORY_CARE_PROVIDER_SITE_OTHER): Payer: Self-pay

## 2016-12-12 DIAGNOSIS — O99321 Drug use complicating pregnancy, first trimester: Secondary | ICD-10-CM

## 2016-12-12 DIAGNOSIS — O09291 Supervision of pregnancy with other poor reproductive or obstetric history, first trimester: Secondary | ICD-10-CM

## 2016-12-12 DIAGNOSIS — N926 Irregular menstruation, unspecified: Secondary | ICD-10-CM

## 2016-12-12 DIAGNOSIS — Z3201 Encounter for pregnancy test, result positive: Secondary | ICD-10-CM

## 2016-12-12 DIAGNOSIS — Z8751 Personal history of pre-term labor: Secondary | ICD-10-CM

## 2016-12-12 DIAGNOSIS — F1120 Opioid dependence, uncomplicated: Secondary | ICD-10-CM

## 2016-12-13 LAB — RPR: RPR: NONREACTIVE

## 2016-12-13 LAB — CBC WITH DIFFERENTIAL/PLATELET
BASOS: 0 %
Basophils Absolute: 0 10*3/uL (ref 0.0–0.2)
EOS (ABSOLUTE): 0.1 10*3/uL (ref 0.0–0.4)
EOS: 3 %
HEMATOCRIT: 37 % (ref 34.0–46.6)
HEMOGLOBIN: 12.3 g/dL (ref 11.1–15.9)
IMMATURE GRANS (ABS): 0 10*3/uL (ref 0.0–0.1)
Immature Granulocytes: 0 %
LYMPHS: 37 %
Lymphocytes Absolute: 1.4 10*3/uL (ref 0.7–3.1)
MCH: 29.5 pg (ref 26.6–33.0)
MCHC: 33.2 g/dL (ref 31.5–35.7)
MCV: 89 fL (ref 79–97)
MONOCYTES: 11 %
Monocytes Absolute: 0.4 10*3/uL (ref 0.1–0.9)
NEUTROS ABS: 1.9 10*3/uL (ref 1.4–7.0)
Neutrophils: 49 %
Platelets: 195 10*3/uL (ref 150–379)
RBC: 4.17 x10E6/uL (ref 3.77–5.28)
RDW: 13.4 % (ref 12.3–15.4)
WBC: 3.9 10*3/uL (ref 3.4–10.8)

## 2016-12-13 LAB — HIV ANTIBODY (ROUTINE TESTING W REFLEX): HIV SCREEN 4TH GENERATION: NONREACTIVE

## 2016-12-13 LAB — ABO AND RH: RH TYPE: POSITIVE

## 2016-12-13 LAB — URINALYSIS, ROUTINE W REFLEX MICROSCOPIC
Bilirubin, UA: NEGATIVE
Glucose, UA: NEGATIVE
Ketones, UA: NEGATIVE
Leukocytes, UA: NEGATIVE
Nitrite, UA: NEGATIVE
Protein, UA: NEGATIVE
RBC, UA: NEGATIVE
Specific Gravity, UA: >=1.03 — AB (ref 1.005–1.030)
Urobilinogen, Ur: 1 mg/dL (ref 0.2–1.0)
pH, UA: 6 (ref 5.0–7.5)

## 2016-12-13 LAB — URINE CULTURE: ORGANISM ID, BACTERIA: NO GROWTH

## 2016-12-13 LAB — GC/CHLAMYDIA PROBE AMP
Chlamydia trachomatis, NAA: NEGATIVE
Neisseria gonorrhoeae by PCR: NEGATIVE

## 2016-12-13 LAB — RUBELLA SCREEN: Rubella Antibodies, IGG: 2.02 index (ref >0.99)

## 2016-12-13 LAB — VARICELLA ZOSTER ANTIBODY, IGG: Varicella zoster IgG: <135 index — ABNORMAL LOW (ref >165)

## 2016-12-13 LAB — ANTIBODY SCREEN: Antibody Screen: NEGATIVE

## 2016-12-13 LAB — HEPATITIS B SURFACE ANTIGEN: HEP B S AG: NEGATIVE

## 2016-12-17 LAB — MONITOR DRUG PROFILE 14(MW)
AMPHETAMINE SCREEN URINE: NEGATIVE ng/mL
BARBITURATE SCREEN URINE: NEGATIVE ng/mL
BENZODIAZEPINE SCREEN, URINE: NEGATIVE ng/mL
CANNABINOIDS UR QL SCN: NEGATIVE ng/mL
Cocaine (Metab) Scrn, Ur: NEGATIVE ng/mL
Creatinine(Crt), U: 251 mg/dL (ref 20.0–300.0)
Fentanyl, Urine: NEGATIVE pg/mL
MEPERIDINE SCREEN, URINE: NEGATIVE ng/mL
Methadone Screen, Urine: NEGATIVE ng/mL
OPIATE SCREEN URINE: NEGATIVE ng/mL
OXYCODONE+OXYMORPHONE UR QL SCN: NEGATIVE ng/mL
Ph of Urine: 6 (ref 4.5–8.9)
Phencyclidine Qn, Ur: NEGATIVE ng/mL
Propoxyphene Scrn, Ur: NEGATIVE ng/mL
SPECIFIC GRAVITY: 1.033
TRAMADOL SCREEN, URINE: NEGATIVE ng/mL

## 2016-12-17 LAB — BUPRENORPHINE CONFIRM, URINE
BUPRENORPHINE CONFIRM: 58 ng/mL
BUPRENORPHINE: POSITIVE — AB
Buprenorphine: POSITIVE — AB
NORBUPRENORPHINE CONFIRM: 208 ng/mL
Norbuprenorphine: POSITIVE — AB

## 2017-01-13 ENCOUNTER — Ambulatory Visit (INDEPENDENT_AMBULATORY_CARE_PROVIDER_SITE_OTHER): Payer: Medicaid Other | Admitting: Obstetrics and Gynecology

## 2017-01-13 ENCOUNTER — Encounter: Payer: Self-pay | Admitting: Obstetrics and Gynecology

## 2017-01-13 VITALS — BP 96/57 | HR 67 | Wt 97.1 lb

## 2017-01-13 DIAGNOSIS — Z8751 Personal history of pre-term labor: Secondary | ICD-10-CM

## 2017-01-13 DIAGNOSIS — R636 Underweight: Secondary | ICD-10-CM

## 2017-01-13 DIAGNOSIS — F1120 Opioid dependence, uncomplicated: Secondary | ICD-10-CM

## 2017-01-13 DIAGNOSIS — O99321 Drug use complicating pregnancy, first trimester: Secondary | ICD-10-CM

## 2017-01-13 DIAGNOSIS — O09291 Supervision of pregnancy with other poor reproductive or obstetric history, first trimester: Secondary | ICD-10-CM

## 2017-01-13 NOTE — Addendum Note (Signed)
Addended by: Brooke DareSICK, Charnetta Wulff L on: 01/13/2017 01:36 PM   Modules accepted: Orders

## 2017-01-13 NOTE — Progress Notes (Signed)
NOB:   HPI:      [name] is a 21 y.o. [phone] who LMP was Patient's last menstrual period was 10/24/2016.  Subjective:   She presents today for her new OB physical.  She reports daily nausea and some vomiting but is keeping liquids down and some food. She has weaned herself off of all narcotics and is currently weaning herself off of Suboxone.  She reports that she is down to 2 mg/day. Her history is significant for 2 previous preterm deliveries and she desires 3817 P with this pregnancy.    Hx: The following portions of the patient's history were reviewed and updated as appropriate:             She  has a past medical history of Headache and SAB (spontaneous abortion) (01/19/2014). She does not have any pertinent problems on file. She  has no past surgical history on file. Her family history includes Hypertension in her father and paternal grandfather. She  reports that  has never smoked. she has never used smokeless tobacco. She reports that she does not drink alcohol or use drugs. She has No Known Allergies.       Review of Systems:  Review of Systems  Constitutional: Denied constitutional symptoms, night sweats, recent illness, fatigue, fever, insomnia and weight loss.  Eyes: Denied eye symptoms, eye pain, photophobia, vision change and visual disturbance.  Ears/Nose/Throat/Neck: Denied ear, nose, throat or neck symptoms, hearing loss, nasal discharge, sinus congestion and sore throat.  Cardiovascular: Denied cardiovascular symptoms, arrhythmia, chest pain/pressure, edema, exercise intolerance, orthopnea and palpitations.  Respiratory: Denied pulmonary symptoms, asthma, pleuritic pain, productive sputum, cough, dyspnea and wheezing.  Gastrointestinal: Denied, gastro-esophageal reflux, melena, nausea and vomiting.  Genitourinary: Denied genitourinary symptoms including symptomatic vaginal discharge, pelvic relaxation issues, and urinary complaints.  Musculoskeletal: Denied  musculoskeletal symptoms, stiffness, swelling, muscle weakness and myalgia.  Dermatologic: Denied dermatology symptoms, rash and scar.  Neurologic: Denied neurology symptoms, dizziness, headache, neck pain and syncope.  Psychiatric: Denied psychiatric symptoms, anxiety and depression.  Endocrine: Denied endocrine symptoms including hot flashes and night sweats.   Meds:   Current Outpatient Medications on File Prior to Visit  Medication Sig Dispense Refill  . Buprenorphine HCl-Naloxone HCl (SUBOXONE) 8-2 MG FILM Place under the tongue.    . Prenatal Vit-Fe Fumarate-FA (PRENATAL MULTIVITAMIN) TABS tablet Take 1 tablet by mouth daily at 12 noon.     No current facility-administered medications on file prior to visit.     Objective:     Vitals:   01/13/17 1120  BP: (!) 96/57  Pulse: 67              Physical examination General NAD, Conversant  HEENT Atraumatic; Op clear with mmm.  Normo-cephalic. Pupils reactive. Anicteric sclerae  Thyroid/Neck Smooth without nodularity or enlargement. Normal ROM.  Neck Supple.  Skin No rashes, lesions or ulceration. Normal palpated skin turgor. No nodularity.  Breasts: No masses or discharge.  Symmetric.  No axillary adenopathy.  Lungs: Clear to auscultation.No rales or wheezes. Normal Respiratory effort, no retractions.  Heart: NSR.  No murmurs or rubs appreciated. No periferal edema  Abdomen: Soft.  Non-tender.  No masses.  No HSM. No hernia  Extremities: Moves all appropriately.  Normal ROM for age. No lymphadenopathy.  Neuro: Oriented to PPT.  Normal mood. Normal affect.     Pelvic:   Vulva: Normal appearance.  No lesions.  Vagina: No lesions or abnormalities noted.  Support: Normal pelvic support.  Urethra No masses tenderness or scarring.  Meatus Normal size without lesions or prolapse.  Cervix: Normal appearance.  No lesions.  Anus: Normal exam.  No lesions.  Perineum: Normal exam.  No lesions.        Bimanual   Adnexae: No masses.   Non-tender to palpation.  Uterus: Enlarged. 10wks  Non-tender.  Mobile.  AV.  Adnexae: No masses.  Non-tender to palpation.  Cul-de-sac: Negative for abnormality.  Adnexae: No masses.  Non-tender to palpation.         Pelvimetry   Diagonal: Reached.  Spines: Average.  Sacrum: Concave.  Pubic Arch: Normal.      Assessment:    [id number] Patient Active Problem List   Diagnosis Date Noted  . History of preterm delivery 11/28/2016  . Suboxone maintenance treatment complicating pregnancy, antepartum, first trimester (HCC) 11/28/2016  . History of preterm premature rupture of membranes (PPROM) 09/25/2015  . Premature cervical dilation in third trimester 09/07/2015  . History of pregnancy loss in prior pregnancy, currently pregnant in first trimester 05/14/2015  . History of drug use 05/14/2015  . Underweight 05/14/2015     1. History of pregnancy loss in prior pregnancy, currently pregnant in first trimester   2. History of preterm delivery   3. Underweight   4. Suboxone maintenance treatment complicating pregnancy, antepartum, first trimester (HCC)        Plan:            1.  Patient to meet with Jola BabinskiMarilyn today about obtaining Suboxone.  2.  We discussed nausea and vomiting of pregnancy and if her symptoms get worse she will let us know.  Would consider medication at that point.  3.  Begin 17 P at 16 weeks.  4.  GC/CT performed today. Orders No orders of the defined types were placed in this encounter.   No orders of the defined types were placed in this encounter.     F/U  No Follow-up on file.  Elonda Huskyavid J. Ranson Belluomini, M.D. 01/13/2017 12:21 PM

## 2017-01-15 ENCOUNTER — Encounter (INDEPENDENT_AMBULATORY_CARE_PROVIDER_SITE_OTHER): Payer: Self-pay

## 2017-01-15 ENCOUNTER — Telehealth: Payer: Self-pay

## 2017-01-15 NOTE — Telephone Encounter (Signed)
Message left on pts voicemail to please let us know when Medicaid is approved. She also needs to come to the office to sign the 17 P authorization form.

## 2017-01-17 LAB — GC/CHLAMYDIA PROBE AMP
CHLAMYDIA, DNA PROBE: NEGATIVE
Neisseria gonorrhoeae by PCR: NEGATIVE

## 2017-01-27 NOTE — L&D Delivery Note (Signed)
Delivery Summary for Jill Shepherd  Labor Events:   Preterm labor:   Rupture date: 07/17/2017  Rupture time: 8:15 AM  Rupture type: Artificial Bulging bag of water  Fluid Color:   Induction:   Augmentation:   Complications:   Cervical ripening:          Delivery:   Episiotomy:   Lacerations:   Repair suture:   Repair # of packets:   Blood loss (ml): 200   Information for the patient's newborn:  Jill Shepherd, Boy Jill [[id number]][[id number]]    Delivery 07/17/2017 11:54 AM by  Vaginal, Spontaneous Sex:  female Gestational Age: 3915w3d Delivery Clinician:   Living?:         APGARS  One minute Five minutes Ten minutes  Skin color:        Heart rate:        Grimace:        Muscle tone:        Breathing:        Totals: 8  9      Presentation/position:      Resuscitation:   Cord information:    Disposition of cord blood:     Blood gases sent?  Complications:   Placenta: Delivered:       appearance Newborn Measurements: Weight: 6 lb 7 oz (2920 g)  Height: 19.49"  Head circumference:    Chest circumference:    Other providers:    Additional  information: Forceps:   Vacuum:   Breech:   Observed anomalies        Delivery Note At 11:54 AM a viable and healthy female was delivered via Vaginal, Spontaneous (Presentation: vertex; cephalic).  APGAR: 8, 9; weight 6 lb 7 oz (2920 g).   Placenta status: spontaneously removed, intact.  Cord: 3-vessel with the following complications: none.  Cord pH: not obtained.  Delayed cord clamping observed.   Anesthesia: Epidural Episiotomy: None Lacerations:  None Suture Repair: None Est. Blood Loss (mL):    Mom to postpartum.  Baby to Couplet care / Skin to Skin.  Jill Shepherd 07/17/2017, 3:41 PM

## 2017-02-05 ENCOUNTER — Telehealth: Payer: Self-pay

## 2017-02-05 NOTE — Telephone Encounter (Signed)
[facility] reminded pt she needed to contact us when her Medicaid was approved so 17P could be ordered. Pt states she forgot. States cards were mailed to wrong address so she is waiting for them to arrive. She gave a number which was put on order form and [facility]ed to [facility]. Confirmation received.

## 2017-02-10 ENCOUNTER — Telehealth: Payer: Self-pay | Admitting: *Deleted

## 2017-02-10 ENCOUNTER — Ambulatory Visit (INDEPENDENT_AMBULATORY_CARE_PROVIDER_SITE_OTHER): Payer: Medicaid Other | Admitting: Obstetrics and Gynecology

## 2017-02-10 ENCOUNTER — Encounter: Payer: Self-pay | Admitting: Obstetrics and Gynecology

## 2017-02-10 VITALS — BP 98/60 | HR 109 | Wt 97.9 lb

## 2017-02-10 DIAGNOSIS — Z1379 Encounter for other screening for genetic and chromosomal anomalies: Secondary | ICD-10-CM

## 2017-02-10 DIAGNOSIS — Z8751 Personal history of pre-term labor: Secondary | ICD-10-CM

## 2017-02-10 DIAGNOSIS — Z8759 Personal history of other complications of pregnancy, childbirth and the puerperium: Secondary | ICD-10-CM

## 2017-02-10 DIAGNOSIS — Z3402 Encounter for supervision of normal first pregnancy, second trimester: Secondary | ICD-10-CM

## 2017-02-10 DIAGNOSIS — F1120 Opioid dependence, uncomplicated: Secondary | ICD-10-CM

## 2017-02-10 DIAGNOSIS — O09292 Supervision of pregnancy with other poor reproductive or obstetric history, second trimester: Secondary | ICD-10-CM

## 2017-02-10 DIAGNOSIS — O99320 Drug use complicating pregnancy, unspecified trimester: Secondary | ICD-10-CM

## 2017-02-10 LAB — POCT URINALYSIS DIPSTICK
BILIRUBIN UA: NEGATIVE
Blood, UA: NEGATIVE
GLUCOSE UA: NEGATIVE
KETONES UA: NEGATIVE
Leukocytes, UA: NEGATIVE
Nitrite, UA: NEGATIVE
PH UA: 7.5 (ref 5.0–8.0)
Protein, UA: NEGATIVE
Spec Grav, UA: 1.015 (ref 1.010–1.025)
UROBILINOGEN UA: 0.2 U/dL

## 2017-02-10 NOTE — Telephone Encounter (Signed)
Pateicnt was in office today and got her genetic testing done. Patient DO NOT want to know the gender of the baby. Patient states her sister in law Werner Lean( Michelle Gulley)  will pick up the results. Thank you

## 2017-02-10 NOTE — Progress Notes (Signed)
ROB: Doing well, no complaints.  Patient notes she has decided on performing genetic testing. Will perform MaterniT21 today. For AFP next visit. Planning on starting 17-OHP injections soon (awaiting shipment of medication).  Discussed NAS and screening of newborn at delivery if patient is still on Subutex by delivery. RTC in 4 weeks. For anatomy scan at that time. Will postpone flu vaccine until next visit.

## 2017-02-10 NOTE — Telephone Encounter (Signed)
Noted on pts pink sticky.

## 2017-02-10 NOTE — Progress Notes (Signed)
ROB- Pt states she is doing well, no complaints  

## 2017-02-15 LAB — MATERNIT21  PLUS CORE+ESS+SCA, BLOOD
Chromosome 13: NEGATIVE
Chromosome 18: NEGATIVE
Chromosome 21: NEGATIVE
Y Chromosome: DETECTED

## 2017-03-02 ENCOUNTER — Ambulatory Visit (INDEPENDENT_AMBULATORY_CARE_PROVIDER_SITE_OTHER): Payer: Medicaid Other | Admitting: Obstetrics and Gynecology

## 2017-03-02 VITALS — BP 97/57 | HR 100 | Wt 99.3 lb

## 2017-03-02 DIAGNOSIS — O09212 Supervision of pregnancy with history of pre-term labor, second trimester: Secondary | ICD-10-CM | POA: Diagnosis not present

## 2017-03-02 DIAGNOSIS — Z3402 Encounter for supervision of normal first pregnancy, second trimester: Secondary | ICD-10-CM

## 2017-03-02 MED ORDER — HYDROXYPROGESTERONE CAPROATE 250 MG/ML IM OIL
250.0000 mg | TOPICAL_OIL | Freq: Once | INTRAMUSCULAR | Status: AC
  Administered 2017-03-02: 250 mg via INTRAMUSCULAR

## 2017-03-02 NOTE — Progress Notes (Signed)
Pt presents for #  1   inj 17P. Tolerated well. No contractions or bleeding. Next inj due 1 week.

## 2017-03-10 ENCOUNTER — Ambulatory Visit (INDEPENDENT_AMBULATORY_CARE_PROVIDER_SITE_OTHER): Payer: Medicaid Other

## 2017-03-10 ENCOUNTER — Ambulatory Visit (INDEPENDENT_AMBULATORY_CARE_PROVIDER_SITE_OTHER): Payer: Medicaid Other | Admitting: Obstetrics and Gynecology

## 2017-03-10 VITALS — BP 93/61 | HR 93 | Wt 97.5 lb

## 2017-03-10 DIAGNOSIS — Z23 Encounter for immunization: Secondary | ICD-10-CM | POA: Diagnosis not present

## 2017-03-10 DIAGNOSIS — O09292 Supervision of pregnancy with other poor reproductive or obstetric history, second trimester: Secondary | ICD-10-CM

## 2017-03-10 DIAGNOSIS — Z8751 Personal history of pre-term labor: Secondary | ICD-10-CM

## 2017-03-10 DIAGNOSIS — Z3402 Encounter for supervision of normal first pregnancy, second trimester: Secondary | ICD-10-CM | POA: Diagnosis not present

## 2017-03-10 DIAGNOSIS — F1120 Opioid dependence, uncomplicated: Secondary | ICD-10-CM

## 2017-03-10 DIAGNOSIS — O99320 Drug use complicating pregnancy, unspecified trimester: Secondary | ICD-10-CM

## 2017-03-10 DIAGNOSIS — O0992 Supervision of high risk pregnancy, unspecified, second trimester: Secondary | ICD-10-CM

## 2017-03-10 LAB — POCT URINALYSIS DIPSTICK
Bilirubin, UA: NEGATIVE
Glucose, UA: NEGATIVE
Ketones, UA: NEGATIVE
Leukocytes, UA: NEGATIVE
NITRITE UA: NEGATIVE
PH UA: 6.5 (ref 5.0–8.0)
PROTEIN UA: NEGATIVE
RBC UA: NEGATIVE
Spec Grav, UA: 1.02 (ref 1.010–1.025)
UROBILINOGEN UA: 0.2 U/dL

## 2017-03-10 MED ORDER — HYDROXYPROGESTERONE CAPROATE 250 MG/ML IM OIL
250.0000 mg | TOPICAL_OIL | Freq: Once | INTRAMUSCULAR | Status: AC
  Administered 2017-03-10: 250 mg via INTRAMUSCULAR

## 2017-03-10 NOTE — Progress Notes (Signed)
Pt is doing well. Wondering if she needs to get her 17P shot today.

## 2017-03-10 NOTE — Progress Notes (Signed)
ROB: Patient doing well, no complaints.  For flu vaccine and 17-OHP injection today. Normal anatomy scan. RTC in 4 weeks.

## 2017-03-17 ENCOUNTER — Ambulatory Visit (INDEPENDENT_AMBULATORY_CARE_PROVIDER_SITE_OTHER): Payer: Medicaid Other | Admitting: Obstetrics and Gynecology

## 2017-03-17 VITALS — BP 80/56 | HR 88 | Wt 102.5 lb

## 2017-03-17 DIAGNOSIS — Z8751 Personal history of pre-term labor: Secondary | ICD-10-CM | POA: Diagnosis not present

## 2017-03-17 MED ORDER — HYDROXYPROGESTERONE CAPROATE 250 MG/ML IM OIL: 250.0000 mg | TOPICAL_OIL | Freq: Once | INTRAMUSCULAR | Status: DC

## 2017-03-17 NOTE — Progress Notes (Signed)
Pt is here for her 17 p injection, denies any complaints

## 2017-03-24 ENCOUNTER — Ambulatory Visit (INDEPENDENT_AMBULATORY_CARE_PROVIDER_SITE_OTHER): Payer: Medicaid Other | Admitting: Obstetrics and Gynecology

## 2017-03-24 VITALS — BP 101/56 | HR 77 | Wt 104.1 lb

## 2017-03-24 DIAGNOSIS — O09212 Supervision of pregnancy with history of pre-term labor, second trimester: Secondary | ICD-10-CM

## 2017-03-24 MED ORDER — HYDROXYPROGESTERONE CAPROATE 250 MG/ML IM OIL
250.0000 mg | TOPICAL_OIL | Freq: Once | INTRAMUSCULAR | Status: AC
  Administered 2017-03-24: 250 mg via INTRAMUSCULAR

## 2017-03-24 NOTE — Progress Notes (Signed)
Pt is here for 17p injection, she is doing well 

## 2017-03-25 ENCOUNTER — Encounter: Payer: Self-pay | Admitting: Obstetrics and Gynecology

## 2017-03-31 ENCOUNTER — Ambulatory Visit (INDEPENDENT_AMBULATORY_CARE_PROVIDER_SITE_OTHER): Payer: Medicaid Other | Admitting: Obstetrics and Gynecology

## 2017-03-31 ENCOUNTER — Encounter: Payer: Self-pay | Admitting: Obstetrics and Gynecology

## 2017-03-31 VITALS — BP 86/51 | HR 89 | Ht 61.0 in | Wt 103.8 lb

## 2017-03-31 DIAGNOSIS — O09213 Supervision of pregnancy with history of pre-term labor, third trimester: Secondary | ICD-10-CM | POA: Diagnosis not present

## 2017-03-31 DIAGNOSIS — O09893 Supervision of other high risk pregnancies, third trimester: Secondary | ICD-10-CM

## 2017-03-31 MED ORDER — HYDROXYPROGESTERONE CAPROATE 250 MG/ML IM OIL
250.0000 mg | TOPICAL_OIL | Freq: Once | INTRAMUSCULAR | Status: AC
  Administered 2017-03-31: 250 mg via INTRAMUSCULAR

## 2017-03-31 NOTE — Progress Notes (Signed)
Pt is here for 17P.

## 2017-03-31 NOTE — Addendum Note (Signed)
Addended by: Marchelle FolksMILLER, Aloura Matsuoka G on: 03/31/2017 03:55 PM   Modules accepted: Orders

## 2017-04-07 ENCOUNTER — Encounter: Payer: Self-pay | Admitting: Obstetrics and Gynecology

## 2017-04-07 ENCOUNTER — Ambulatory Visit (INDEPENDENT_AMBULATORY_CARE_PROVIDER_SITE_OTHER): Payer: Medicaid Other | Admitting: Obstetrics and Gynecology

## 2017-04-07 VITALS — BP 105/66 | HR 98 | Wt 106.0 lb

## 2017-04-07 DIAGNOSIS — O09212 Supervision of pregnancy with history of pre-term labor, second trimester: Secondary | ICD-10-CM

## 2017-04-07 LAB — POCT URINALYSIS DIPSTICK
BILIRUBIN UA: NEGATIVE
Blood, UA: NEGATIVE
GLUCOSE UA: NEGATIVE
Ketones, UA: NEGATIVE
Leukocytes, UA: NEGATIVE
Nitrite, UA: NEGATIVE
Odor: NEGATIVE
PH UA: 6.5 (ref 5.0–8.0)
Protein, UA: NEGATIVE
SPEC GRAV UA: 1.025 (ref 1.010–1.025)
UROBILINOGEN UA: 0.2 U/dL

## 2017-04-07 MED ORDER — HYDROXYPROGESTERONE CAPROATE 250 MG/ML IM OIL
250.0000 mg | TOPICAL_OIL | Freq: Once | INTRAMUSCULAR | Status: AC
  Administered 2017-04-07: 250 mg via INTRAMUSCULAR

## 2017-04-07 NOTE — Progress Notes (Signed)
ROB: Occasional nausea.  Patient says wrist bands work most of the time.  Using Subutex-has not switched to pill form approximately 8 mg/day.  Patient feels that this is a stable dose for her.  17 P.

## 2017-04-07 NOTE — Progress Notes (Signed)
ROB, 17P inj- no complaints.

## 2017-04-09 ENCOUNTER — Encounter: Payer: Self-pay | Admitting: Obstetrics and Gynecology

## 2017-04-13 ENCOUNTER — Other Ambulatory Visit: Payer: Self-pay

## 2017-04-13 ENCOUNTER — Encounter: Payer: Self-pay | Admitting: Obstetrics and Gynecology

## 2017-04-13 MED ORDER — RANITIDINE HCL 150 MG PO TABS: 150.0000 mg | ORAL_TABLET | Freq: Two times a day (BID) | ORAL | 4 refills | Status: DC

## 2017-04-13 MED ORDER — ONDANSETRON HCL 4 MG PO TABS: 4.0000 mg | ORAL_TABLET | Freq: Three times a day (TID) | ORAL | 2 refills | Status: DC | PRN

## 2017-04-13 NOTE — Telephone Encounter (Signed)
Please send in a prescription for Zantac 150 mg BID (#60, 4 refills) and Zofran 4 mg (#30, refill 2)

## 2017-04-14 ENCOUNTER — Ambulatory Visit (INDEPENDENT_AMBULATORY_CARE_PROVIDER_SITE_OTHER): Payer: Medicaid Other | Admitting: Obstetrics and Gynecology

## 2017-04-14 ENCOUNTER — Encounter: Payer: Self-pay | Admitting: Obstetrics and Gynecology

## 2017-04-14 VITALS — BP 89/52 | HR 77 | Ht 62.0 in | Wt 105.7 lb

## 2017-04-14 DIAGNOSIS — Z8751 Personal history of pre-term labor: Secondary | ICD-10-CM | POA: Diagnosis not present

## 2017-04-14 DIAGNOSIS — O09212 Supervision of pregnancy with history of pre-term labor, second trimester: Secondary | ICD-10-CM | POA: Diagnosis not present

## 2017-04-14 MED ORDER — HYDROXYPROGESTERONE CAPROATE 250 MG/ML IM OIL
250.0000 mg | TOPICAL_OIL | Freq: Once | INTRAMUSCULAR | Status: AC
  Administered 2017-04-14: 250 mg via INTRAMUSCULAR

## 2017-04-15 NOTE — Progress Notes (Signed)
Pt present for 17P injection. No leaking fluid. No contractions. Pt to follow up in 1 week for next 17p inection.   BP (!) 89/52   Pulse 77   Ht 5\' 2"  (1.575 m)   Wt 105 lb 11.2 oz (47.9 kg)   LMP 10/24/2016   BMI 19.33 kg/m

## 2017-04-15 NOTE — Progress Notes (Signed)
I have reviewed the record and concur with patient management and plan.  Idan Prime, MD [facility]     

## 2017-04-21 ENCOUNTER — Ambulatory Visit (INDEPENDENT_AMBULATORY_CARE_PROVIDER_SITE_OTHER): Payer: Medicaid Other | Admitting: Obstetrics and Gynecology

## 2017-04-21 ENCOUNTER — Encounter: Payer: Self-pay | Admitting: Obstetrics and Gynecology

## 2017-04-21 VITALS — BP 86/48 | HR 67 | Ht 62.0 in | Wt 105.6 lb

## 2017-04-21 DIAGNOSIS — O09292 Supervision of pregnancy with other poor reproductive or obstetric history, second trimester: Secondary | ICD-10-CM | POA: Diagnosis not present

## 2017-04-21 DIAGNOSIS — O09212 Supervision of pregnancy with history of pre-term labor, second trimester: Secondary | ICD-10-CM

## 2017-04-21 MED ORDER — HYDROXYPROGESTERONE CAPROATE 250 MG/ML IM OIL
250.0000 mg | TOPICAL_OIL | Freq: Once | INTRAMUSCULAR | Status: AC
  Administered 2017-04-21: 250 mg via INTRAMUSCULAR

## 2017-04-21 NOTE — Progress Notes (Signed)
Pt presents for 17p inj. No contractions, lof, vaginal bleeding, or pelvic pain/pressure. FH reordered 17p on 04/20/2017. F/u in 1 week for 17p inj.  BP (!) 86/48   Pulse 67   Ht 5\' 2"  (1.575 m)   Wt 105 lb 9.6 oz (47.9 kg)   LMP 10/24/2016   BMI 19.31 kg/m

## 2017-04-28 ENCOUNTER — Ambulatory Visit (INDEPENDENT_AMBULATORY_CARE_PROVIDER_SITE_OTHER): Payer: Medicaid Other | Admitting: Obstetrics and Gynecology

## 2017-04-28 VITALS — BP 98/59 | HR 85 | Wt 108.1 lb

## 2017-04-28 DIAGNOSIS — O09292 Supervision of pregnancy with other poor reproductive or obstetric history, second trimester: Secondary | ICD-10-CM

## 2017-04-28 DIAGNOSIS — O09212 Supervision of pregnancy with history of pre-term labor, second trimester: Secondary | ICD-10-CM

## 2017-04-28 DIAGNOSIS — O09892 Supervision of other high risk pregnancies, second trimester: Secondary | ICD-10-CM

## 2017-04-28 MED ORDER — HYDROXYPROGESTERONE CAPROATE 250 MG/ML IM OIL
250.0000 mg | TOPICAL_OIL | Freq: Once | INTRAMUSCULAR | Status: AC
  Administered 2017-04-28: 250 mg via INTRAMUSCULAR

## 2017-04-28 NOTE — Progress Notes (Signed)
Pt presents for 17P. No contractions or bleeding. Next inj due 1 week. 

## 2017-05-01 ENCOUNTER — Encounter: Payer: Self-pay | Admitting: Obstetrics and Gynecology

## 2017-05-01 NOTE — Progress Notes (Signed)
I have reviewed the record and concur with patient management and plan.  Braidon Chermak, MD [facility]     

## 2017-05-05 ENCOUNTER — Ambulatory Visit (INDEPENDENT_AMBULATORY_CARE_PROVIDER_SITE_OTHER): Payer: Medicaid Other | Admitting: Obstetrics and Gynecology

## 2017-05-05 ENCOUNTER — Other Ambulatory Visit: Payer: Medicaid Other

## 2017-05-05 VITALS — BP 92/51 | HR 90 | Wt 109.1 lb

## 2017-05-05 DIAGNOSIS — O09212 Supervision of pregnancy with history of pre-term labor, second trimester: Secondary | ICD-10-CM | POA: Diagnosis not present

## 2017-05-05 DIAGNOSIS — O0992 Supervision of high risk pregnancy, unspecified, second trimester: Secondary | ICD-10-CM | POA: Diagnosis not present

## 2017-05-05 DIAGNOSIS — O09892 Supervision of other high risk pregnancies, second trimester: Secondary | ICD-10-CM

## 2017-05-05 DIAGNOSIS — O09292 Supervision of pregnancy with other poor reproductive or obstetric history, second trimester: Secondary | ICD-10-CM

## 2017-05-05 DIAGNOSIS — F1120 Opioid dependence, uncomplicated: Secondary | ICD-10-CM

## 2017-05-05 DIAGNOSIS — O99320 Drug use complicating pregnancy, unspecified trimester: Secondary | ICD-10-CM

## 2017-05-05 LAB — POCT URINALYSIS DIPSTICK
BILIRUBIN UA: NEGATIVE
GLUCOSE UA: NEGATIVE
KETONES UA: NEGATIVE
LEUKOCYTES UA: NEGATIVE
Nitrite, UA: NEGATIVE
RBC UA: NEGATIVE
Urobilinogen, UA: 0.2 E.U./dL
pH, UA: 6 (ref 5.0–8.0)

## 2017-05-05 MED ORDER — TETANUS-DIPHTH-ACELL PERTUSSIS 5-2.5-18.5 LF-MCG/0.5 IM SUSP
0.5000 mL | Freq: Once | INTRAMUSCULAR | Status: AC
  Administered 2017-05-05: 0.5 mL via INTRAMUSCULAR

## 2017-05-05 MED ORDER — HYDROXYPROGESTERONE CAPROATE 250 MG/ML IM OIL
250.0000 mg | TOPICAL_OIL | Freq: Once | INTRAMUSCULAR | Status: AC
  Administered 2017-05-05: 250 mg via INTRAMUSCULAR

## 2017-05-05 NOTE — Progress Notes (Signed)
ROB: Doing well.  Denies major issues.  Does note occasional pelvic pressure which resolves after 5 minutes. For 28 week labs today.  Will bottle feed. Desires unsure method for contraception (considering Depo but also counseled on IUD). 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 Discussed patient's concerns with vaccinations due to her last child passing from SIDS at 4 months.  After discussion, patient ok to get Tdap today. For 17-P injection today.   Discussed patient's Subutex use, still taking ~ 8 mg/day (although notes she is sometimes able to cut it in half or down to a fourth, but depends on the day). Reiterated NAS screening at the hospital once infant is born. For growth scan next week for h/o subutex use in pregnancy. RTC in 2 weeks.

## 2017-05-06 LAB — CBC
Hematocrit: 31.4 % — ABNORMAL LOW (ref 34.0–46.6)
Hemoglobin: 10.7 g/dL — ABNORMAL LOW (ref 11.1–15.9)
MCH: 30.1 pg (ref 26.6–33.0)
MCHC: 34.1 g/dL (ref 31.5–35.7)
MCV: 88 fL (ref 79–97)
Platelets: 177 10*3/uL (ref 150–379)
RBC: 3.56 x10E6/uL — ABNORMAL LOW (ref 3.77–5.28)
RDW: 13.5 % (ref 12.3–15.4)
WBC: 7.2 10*3/uL (ref 3.4–10.8)

## 2017-05-06 LAB — GLUCOSE, 1 HOUR GESTATIONAL: Gestational Diabetes Screen: 88 mg/dL (ref 65–139)

## 2017-05-06 LAB — RPR: RPR Ser Ql: NONREACTIVE

## 2017-05-12 ENCOUNTER — Ambulatory Visit (INDEPENDENT_AMBULATORY_CARE_PROVIDER_SITE_OTHER): Payer: Medicaid Other

## 2017-05-12 ENCOUNTER — Ambulatory Visit (INDEPENDENT_AMBULATORY_CARE_PROVIDER_SITE_OTHER): Payer: Medicaid Other | Admitting: Obstetrics and Gynecology

## 2017-05-12 VITALS — BP 89/51 | HR 80 | Wt 110.4 lb

## 2017-05-12 DIAGNOSIS — F1120 Opioid dependence, uncomplicated: Secondary | ICD-10-CM | POA: Diagnosis not present

## 2017-05-12 DIAGNOSIS — O09292 Supervision of pregnancy with other poor reproductive or obstetric history, second trimester: Secondary | ICD-10-CM

## 2017-05-12 DIAGNOSIS — O99320 Drug use complicating pregnancy, unspecified trimester: Secondary | ICD-10-CM

## 2017-05-12 DIAGNOSIS — O0992 Supervision of high risk pregnancy, unspecified, second trimester: Secondary | ICD-10-CM

## 2017-05-12 DIAGNOSIS — Z8751 Personal history of pre-term labor: Secondary | ICD-10-CM

## 2017-05-12 MED ORDER — HYDROXYPROGESTERONE CAPROATE 250 MG/ML IM OIL
250.0000 mg | TOPICAL_OIL | Freq: Once | INTRAMUSCULAR | Status: AC
  Administered 2017-05-12: 250 mg via INTRAMUSCULAR

## 2017-05-18 NOTE — Progress Notes (Signed)
Pt is here for 17p injection.  

## 2017-05-19 ENCOUNTER — Ambulatory Visit (INDEPENDENT_AMBULATORY_CARE_PROVIDER_SITE_OTHER): Payer: Medicaid Other | Admitting: Obstetrics and Gynecology

## 2017-05-19 ENCOUNTER — Other Ambulatory Visit: Payer: Self-pay

## 2017-05-19 ENCOUNTER — Encounter: Payer: Self-pay | Admitting: Obstetrics and Gynecology

## 2017-05-19 VITALS — BP 102/65 | HR 103 | Wt 109.5 lb

## 2017-05-19 DIAGNOSIS — O09212 Supervision of pregnancy with history of pre-term labor, second trimester: Secondary | ICD-10-CM

## 2017-05-19 DIAGNOSIS — F1120 Opioid dependence, uncomplicated: Secondary | ICD-10-CM

## 2017-05-19 DIAGNOSIS — O99320 Drug use complicating pregnancy, unspecified trimester: Secondary | ICD-10-CM

## 2017-05-19 DIAGNOSIS — O0992 Supervision of high risk pregnancy, unspecified, second trimester: Secondary | ICD-10-CM

## 2017-05-19 DIAGNOSIS — O09892 Supervision of other high risk pregnancies, second trimester: Secondary | ICD-10-CM

## 2017-05-19 LAB — POCT URINALYSIS DIPSTICK
BILIRUBIN UA: NEGATIVE
GLUCOSE UA: NEGATIVE
KETONES UA: NEGATIVE
Leukocytes, UA: NEGATIVE
Nitrite, UA: NEGATIVE
Protein, UA: NEGATIVE
RBC UA: NEGATIVE
SPEC GRAV UA: 1.02 (ref 1.010–1.025)
Urobilinogen, UA: 0.2 E.U./dL
pH, UA: 7 (ref 5.0–8.0)

## 2017-05-19 MED ORDER — ONDANSETRON HCL 4 MG PO TABS: 4.0000 mg | ORAL_TABLET | Freq: Three times a day (TID) | ORAL | 2 refills | Status: DC | PRN

## 2017-05-19 MED ORDER — HYDROXYPROGESTERONE CAPROATE 250 MG/ML IM OIL
250.0000 mg | TOPICAL_OIL | Freq: Once | INTRAMUSCULAR | Status: AC
  Administered 2017-05-19: 250 mg via INTRAMUSCULAR

## 2017-05-19 NOTE — Progress Notes (Signed)
ROB: Without complaint.  Subutex 8mg /day.  Ultrasound equals 48th percentile growth.  Begin NSTs at 36 weeks.  Repeat growth scan in 4 weeks from last scan.

## 2017-05-19 NOTE — Progress Notes (Signed)
ROB pt is doing well no concerns.  

## 2017-05-26 ENCOUNTER — Ambulatory Visit (INDEPENDENT_AMBULATORY_CARE_PROVIDER_SITE_OTHER): Payer: Medicaid Other | Admitting: Obstetrics and Gynecology

## 2017-05-26 VITALS — BP 105/63 | HR 96 | Wt 109.7 lb

## 2017-05-26 DIAGNOSIS — O09293 Supervision of pregnancy with other poor reproductive or obstetric history, third trimester: Secondary | ICD-10-CM

## 2017-05-26 DIAGNOSIS — Z8751 Personal history of pre-term labor: Secondary | ICD-10-CM

## 2017-05-26 MED ORDER — HYDROXYPROGESTERONE CAPROATE 250 MG/ML IM OIL
250.0000 mg | TOPICAL_OIL | Freq: Once | INTRAMUSCULAR | Status: AC
  Administered 2017-05-26: 250 mg via INTRAMUSCULAR

## 2017-05-26 NOTE — Progress Notes (Signed)
Pt is here for her 17p injection, she is doing well

## 2017-06-02 ENCOUNTER — Ambulatory Visit (INDEPENDENT_AMBULATORY_CARE_PROVIDER_SITE_OTHER): Payer: Medicaid Other | Admitting: Obstetrics and Gynecology

## 2017-06-02 VITALS — BP 95/58 | HR 107 | Wt 112.4 lb

## 2017-06-02 DIAGNOSIS — O99320 Drug use complicating pregnancy, unspecified trimester: Secondary | ICD-10-CM | POA: Diagnosis not present

## 2017-06-02 DIAGNOSIS — O09293 Supervision of pregnancy with other poor reproductive or obstetric history, third trimester: Secondary | ICD-10-CM | POA: Diagnosis not present

## 2017-06-02 DIAGNOSIS — O09213 Supervision of pregnancy with history of pre-term labor, third trimester: Secondary | ICD-10-CM

## 2017-06-02 DIAGNOSIS — Z8751 Personal history of pre-term labor: Secondary | ICD-10-CM | POA: Diagnosis not present

## 2017-06-02 DIAGNOSIS — O0993 Supervision of high risk pregnancy, unspecified, third trimester: Secondary | ICD-10-CM | POA: Diagnosis not present

## 2017-06-02 DIAGNOSIS — F1120 Opioid dependence, uncomplicated: Secondary | ICD-10-CM

## 2017-06-02 DIAGNOSIS — O99323 Drug use complicating pregnancy, third trimester: Secondary | ICD-10-CM

## 2017-06-02 MED ORDER — HYDROXYPROGESTERONE CAPROATE 250 MG/ML IM OIL
250.0000 mg | TOPICAL_OIL | Freq: Once | INTRAMUSCULAR | Status: AC
  Administered 2017-06-02: 250 mg via INTRAMUSCULAR

## 2017-06-02 NOTE — Progress Notes (Signed)
ROB: Doing well, no complaints.  Discussion had on eat/sleep/console project in conjunction with NAS screening.  For 17-P today.  Continue weekly. For growth scan next week for h/o Subutex therapy.  RTC in 1 week for ultrasound and 2 weeks for OB visit.

## 2017-06-02 NOTE — Progress Notes (Signed)
ROB- pt is doing well 

## 2017-06-09 ENCOUNTER — Ambulatory Visit (INDEPENDENT_AMBULATORY_CARE_PROVIDER_SITE_OTHER): Payer: Medicaid Other | Admitting: Obstetrics and Gynecology

## 2017-06-09 ENCOUNTER — Encounter: Payer: Self-pay | Admitting: Obstetrics and Gynecology

## 2017-06-09 ENCOUNTER — Ambulatory Visit (INDEPENDENT_AMBULATORY_CARE_PROVIDER_SITE_OTHER): Payer: Medicaid Other

## 2017-06-09 DIAGNOSIS — Z8751 Personal history of pre-term labor: Secondary | ICD-10-CM

## 2017-06-09 DIAGNOSIS — O99320 Drug use complicating pregnancy, unspecified trimester: Secondary | ICD-10-CM

## 2017-06-09 DIAGNOSIS — F1120 Opioid dependence, uncomplicated: Secondary | ICD-10-CM

## 2017-06-09 DIAGNOSIS — O0993 Supervision of high risk pregnancy, unspecified, third trimester: Secondary | ICD-10-CM | POA: Diagnosis not present

## 2017-06-09 MED ORDER — HYDROXYPROGESTERONE CAPROATE 250 MG/ML IM OIL
250.0000 mg | TOPICAL_OIL | Freq: Once | INTRAMUSCULAR | Status: AC
  Administered 2017-06-09: 250 mg via INTRAMUSCULAR

## 2017-06-09 NOTE — Progress Notes (Signed)
Pt presents for 17P injection. NO ctx, vb, or pp. Growth scan today for h/o subutex. 17P reordered. Pt to f/u 1 week for next 17P inj and rob with DJE.     BP 100/63   Pulse 79   Ht  (1.6 m)   Wt 113 lb 1.6 oz (51.3 kg)   LMP 10/24/2016   BMI 20.03 kg/m

## 2017-06-10 ENCOUNTER — Encounter: Payer: Self-pay | Admitting: Obstetrics and Gynecology

## 2017-06-16 ENCOUNTER — Ambulatory Visit (INDEPENDENT_AMBULATORY_CARE_PROVIDER_SITE_OTHER): Payer: Medicaid Other | Admitting: Obstetrics and Gynecology

## 2017-06-16 ENCOUNTER — Encounter: Payer: Self-pay | Admitting: Obstetrics and Gynecology

## 2017-06-16 VITALS — BP 91/58 | HR 121 | Wt 112.0 lb

## 2017-06-16 DIAGNOSIS — O0993 Supervision of high risk pregnancy, unspecified, third trimester: Secondary | ICD-10-CM | POA: Diagnosis not present

## 2017-06-16 DIAGNOSIS — F1120 Opioid dependence, uncomplicated: Secondary | ICD-10-CM

## 2017-06-16 DIAGNOSIS — Z8751 Personal history of pre-term labor: Secondary | ICD-10-CM

## 2017-06-16 DIAGNOSIS — O99320 Drug use complicating pregnancy, unspecified trimester: Secondary | ICD-10-CM

## 2017-06-16 DIAGNOSIS — O09213 Supervision of pregnancy with history of pre-term labor, third trimester: Secondary | ICD-10-CM

## 2017-06-16 DIAGNOSIS — O99323 Drug use complicating pregnancy, third trimester: Secondary | ICD-10-CM | POA: Diagnosis not present

## 2017-06-16 LAB — POCT URINALYSIS DIPSTICK
Bilirubin, UA: NEGATIVE
Blood, UA: NEGATIVE
Glucose, UA: NEGATIVE
Ketones, UA: NEGATIVE
Leukocytes, UA: NEGATIVE
NITRITE UA: NEGATIVE
PH UA: 7.5 (ref 5.0–8.0)
PROTEIN UA: POSITIVE — AB
Spec Grav, UA: 1.01 (ref 1.010–1.025)
Urobilinogen, UA: 0.2 E.U./dL

## 2017-06-16 MED ORDER — HYDROXYPROGESTERONE CAPROATE 250 MG/ML IM OIL
250.0000 mg | TOPICAL_OIL | Freq: Once | INTRAMUSCULAR | Status: AC
  Administered 2017-06-16: 250 mg via INTRAMUSCULAR

## 2017-06-16 NOTE — Progress Notes (Signed)
ROB-pt stated that she is having some pressure and pain off and on. Pt also having cramping that are off and on. Both for a couple of days.

## 2017-06-16 NOTE — Progress Notes (Signed)
ROB: Patient doing well.  Reports occasional pelvic pressure and some contractions but says this is not like her previous episode with preterm labor.  Declined pelvic exam today.  Signs and symptoms of labor discussed and warnings given.  17 P today.  Ultrasound from last week reviewed showing 46 percentile fetal growth.  Begin NSTs at 36 weeks.  Patient continues on Subutex.

## 2017-06-23 ENCOUNTER — Ambulatory Visit (INDEPENDENT_AMBULATORY_CARE_PROVIDER_SITE_OTHER): Payer: Medicaid Other | Admitting: Obstetrics and Gynecology

## 2017-06-23 ENCOUNTER — Observation Stay
Admission: EM | Admit: 2017-06-23 | Discharge: 2017-06-23 | Disposition: A | Discharge status: Home / Self Care | Payer: Medicaid Other | Attending: Obstetrics and Gynecology | Admitting: Obstetrics and Gynecology

## 2017-06-23 ENCOUNTER — Other Ambulatory Visit: Payer: Self-pay

## 2017-06-23 VITALS — BP 97/59 | HR 103 | Wt 113.4 lb

## 2017-06-23 DIAGNOSIS — O99320 Drug use complicating pregnancy, unspecified trimester: Secondary | ICD-10-CM

## 2017-06-23 DIAGNOSIS — Z8751 Personal history of pre-term labor: Secondary | ICD-10-CM | POA: Diagnosis not present

## 2017-06-23 DIAGNOSIS — O26893 Other specified pregnancy related conditions, third trimester: Secondary | ICD-10-CM | POA: Diagnosis not present

## 2017-06-23 DIAGNOSIS — Z3A34 34 weeks gestation of pregnancy: Secondary | ICD-10-CM

## 2017-06-23 DIAGNOSIS — F1120 Opioid dependence, uncomplicated: Secondary | ICD-10-CM

## 2017-06-23 DIAGNOSIS — R102 Pelvic and perineal pain: Secondary | ICD-10-CM | POA: Diagnosis not present

## 2017-06-23 DIAGNOSIS — N898 Other specified noninflammatory disorders of vagina: Secondary | ICD-10-CM | POA: Insufficient documentation

## 2017-06-23 LAB — URINALYSIS, ROUTINE W REFLEX MICROSCOPIC
Bacteria, UA: NONE SEEN
Bilirubin Urine: NEGATIVE
GLUCOSE, UA: NEGATIVE mg/dL
HGB URINE DIPSTICK: NEGATIVE
KETONES UR: 5 mg/dL — AB
Leukocytes, UA: NEGATIVE
NITRITE: NEGATIVE
PH: 6 (ref 5.0–8.0)
PROTEIN: 30 mg/dL — AB
Specific Gravity, Urine: 1.033 — ABNORMAL HIGH (ref 1.005–1.030)

## 2017-06-23 MED ORDER — HYDROXYPROGESTERONE CAPROATE 250 MG/ML IM OIL
250.0000 mg | TOPICAL_OIL | Freq: Once | INTRAMUSCULAR | Status: AC
  Administered 2017-06-23: 250 mg via INTRAMUSCULAR

## 2017-06-23 NOTE — OB Triage Note (Signed)
Pt arrived to unit with c/o vaginal pressure and increased vaginal discharge. Denied vaginal bleeding, contractions or any other concerns. +FM. Monitors applied. Will continue to monitor and assess.

## 2017-06-23 NOTE — Discharge Instructions (Signed)

## 2017-06-23 NOTE — Progress Notes (Signed)
I have reviewed the record and concur with patient management and plan.  Kirsti Mcalpine, MD [facility]     

## 2017-06-23 NOTE — Progress Notes (Signed)
Pt is here for 17p injection, she is doing well 

## 2017-06-24 NOTE — Discharge Summary (Signed)
    L&D OB Triage Note  SUBJECTIVE Jill Shepherd is a 22 y.o. [phone] female at [redacted]w[redacted]d, EDD Estimated Date of Delivery: 08/04/17 who presented to triage with complaints of vaginal discharge and pelvic pressure.   OB History  Gravida Para Term Preterm AB Living  4 2 0 2 1 0  SAB TAB Ectopic Multiple Live Births  1 0 0 0 1    # Outcome Date GA Lbr Len/2nd Weight Sex Delivery Anes PTL Lv  4 Current           3 SAB 2018          2 Preterm 09/26/15 [redacted]w[redacted]d  4 lb 10 oz (2.098 kg) M  EPI Y DEC  1 Preterm 12/2013 [redacted]w[redacted]d      Y FD    Obstetric Comments  2015 - spontaneous preterm labor, fetus passed several minutes after delivery.   2017 - Infant demise at 4 months, suspected SIDS    No medications prior to admission.     OBJECTIVE  Nursing Evaluation:   BP 111/71   Pulse (!) 104   Temp 98.2 F (36.8 C) (Oral)   Resp 18   Ht  (1.6 m)   Wt 113 lb (51.3 kg)   LMP 10/24/2016   BMI 20.02 kg/m    Findings:   NTZ negative     U/A negative  NST was performed and has been reviewed by me.  NST INTERPRETATION: Category I  Mode: External Baseline Rate (A): 130 bpm Variability: Moderate Accelerations: 15 x 15 Decelerations: None     Contraction Frequency (min): UI  ASSESSMENT Impression:  1.  Pregnancy:  [id number] at [redacted]w[redacted]d , EDD Estimated Date of Delivery: 08/04/17 2.  NST:  Category I  PLAN 1. Reassurance given 2. Discharge home with standard labor precautions given to return to L&D or call the office for problems. 3. Continue routine prenatal care.

## 2017-06-30 ENCOUNTER — Ambulatory Visit (INDEPENDENT_AMBULATORY_CARE_PROVIDER_SITE_OTHER): Payer: Medicaid Other | Admitting: Obstetrics and Gynecology

## 2017-06-30 ENCOUNTER — Encounter: Payer: Self-pay | Admitting: Obstetrics and Gynecology

## 2017-06-30 VITALS — BP 101/63 | HR 105 | Wt 113.8 lb

## 2017-06-30 DIAGNOSIS — O0993 Supervision of high risk pregnancy, unspecified, third trimester: Secondary | ICD-10-CM

## 2017-06-30 DIAGNOSIS — Z8751 Personal history of pre-term labor: Secondary | ICD-10-CM

## 2017-06-30 DIAGNOSIS — O09213 Supervision of pregnancy with history of pre-term labor, third trimester: Secondary | ICD-10-CM | POA: Diagnosis not present

## 2017-06-30 LAB — POCT URINALYSIS DIPSTICK
Bilirubin, UA: NEGATIVE
Blood, UA: NEGATIVE
Glucose, UA: NEGATIVE
KETONES UA: NEGATIVE
LEUKOCYTES UA: NEGATIVE
NITRITE UA: NEGATIVE
PH UA: 8 (ref 5.0–8.0)
PROTEIN UA: POSITIVE — AB
Spec Grav, UA: 1.01 (ref 1.010–1.025)
Urobilinogen, UA: 0.2 E.U./dL

## 2017-06-30 MED ORDER — HYDROXYPROGESTERONE CAPROATE 250 MG/ML IM OIL
250.0000 mg | TOPICAL_OIL | Freq: Once | INTRAMUSCULAR | Status: AC
  Administered 2017-06-30: 250 mg via INTRAMUSCULAR

## 2017-06-30 NOTE — Progress Notes (Signed)
Jill Shepherd-pt stated that she has been having some contraction thinking they are braxton hicks. No other concerns.

## 2017-06-30 NOTE — Progress Notes (Signed)
ROB: Patient notes that she is having occasional contractions, may be CSX CorporationBraxton Hicks. Exam noted above. Discussed PTL precautions as patient also with h/o PTL. 3rd trimester labs done. 17-P given today. RTC in 1 week. To begin NSTs for subutex use. Given letter regarding NAS screen and [facility].

## 2017-07-02 LAB — GC/CHLAMYDIA PROBE AMP
CHLAMYDIA, DNA PROBE: NEGATIVE
NEISSERIA GONORRHOEAE BY PCR: NEGATIVE

## 2017-07-02 LAB — STREP GP B NAA: Strep Gp B NAA: NEGATIVE

## 2017-07-06 ENCOUNTER — Telehealth: Payer: Self-pay | Admitting: Obstetrics and Gynecology

## 2017-07-06 NOTE — Telephone Encounter (Signed)
The patient called and stated that she lost her mucus plug and that it was bloody. The patient would like to speak with a nurse as soon as possible to make sure everything is okay. Please advise.

## 2017-07-06 NOTE — Telephone Encounter (Signed)
OB 35 6/7  Pt states last nite after IC she saw several spots of blood. Largest the size of a quarter. She also had ctx for 2-3 hours after sex that would come and go. NO vb today. Pos fm. Pt states she has a a lot of d/c that is clear and thin. No change. Advised to monitor for now. If ctx, gush of fluid, or decreased FM to head to ED. Pt to f/u at visit tomorrow.

## 2017-07-07 ENCOUNTER — Ambulatory Visit (INDEPENDENT_AMBULATORY_CARE_PROVIDER_SITE_OTHER): Payer: Medicaid Other | Admitting: Obstetrics and Gynecology

## 2017-07-07 ENCOUNTER — Encounter: Payer: Self-pay | Admitting: Obstetrics and Gynecology

## 2017-07-07 ENCOUNTER — Other Ambulatory Visit: Payer: Medicaid Other

## 2017-07-07 ENCOUNTER — Observation Stay
Admission: EM | Admit: 2017-07-07 | Discharge: 2017-07-07 | Disposition: A | Discharge status: Home / Self Care | Payer: Medicaid Other | Attending: Obstetrics and Gynecology | Admitting: Obstetrics and Gynecology

## 2017-07-07 VITALS — BP 110/67 | HR 110 | Wt 113.0 lb

## 2017-07-07 DIAGNOSIS — O368330 Maternal care for abnormalities of the fetal heart rate or rhythm, third trimester, not applicable or unspecified: Secondary | ICD-10-CM | POA: Diagnosis not present

## 2017-07-07 DIAGNOSIS — Z3A36 36 weeks gestation of pregnancy: Secondary | ICD-10-CM | POA: Diagnosis not present

## 2017-07-07 DIAGNOSIS — Z8751 Personal history of pre-term labor: Secondary | ICD-10-CM

## 2017-07-07 DIAGNOSIS — O0993 Supervision of high risk pregnancy, unspecified, third trimester: Secondary | ICD-10-CM

## 2017-07-07 DIAGNOSIS — F1120 Opioid dependence, uncomplicated: Secondary | ICD-10-CM

## 2017-07-07 DIAGNOSIS — O99320 Drug use complicating pregnancy, unspecified trimester: Secondary | ICD-10-CM

## 2017-07-07 DIAGNOSIS — Z3490 Encounter for supervision of normal pregnancy, unspecified, unspecified trimester: Secondary | ICD-10-CM

## 2017-07-07 MED ORDER — HYDROXYPROGESTERONE CAPROATE 250 MG/ML IM OIL
250.0000 mg | TOPICAL_OIL | Freq: Once | INTRAMUSCULAR | Status: AC
  Administered 2017-07-07: 250 mg via INTRAMUSCULAR

## 2017-07-07 NOTE — OB Triage Note (Signed)
G4P0 pt presents to [facility] from [facility] for extended monitoring d/t non-reactive NST at office. Monitors applied and assessing.

## 2017-07-07 NOTE — Progress Notes (Signed)
ROB: NST currently not reactive.  Patient has no complaints.  Denies contractions.  Reported some spotting earlier this week after intercourse but this has since resolved.  Last 17 P shot today. Patient sent to labor and delivery for extended monitoring.  Consider BPP if NST remains nonreactive.  I have discussed this in detail with the patient. NONSTRESS TEST INTERPRETATION  INDICATIONS: Subutex use  RESULTS:  nonreactive COMMENTS: Extended monitoring here in the office reveals no decelerations but no accelerations.  Minimal long-term variability  PLAN: 1. Continue fetal kick counts as directed. 2. Continue antepartum testing as scheduled. 3.  Patient to labor delivery for extended monitoring.  Elonda Huskyavid J. Evans, M.D. 07/07/2017 4:30 PM

## 2017-07-07 NOTE — Progress Notes (Signed)
Zerita BoersKelly Yates, RN spoke to MD at 1950. MD "unavailable to put in orders" d/t "meeting from 6pm-8pm." MD okay with discharge if pt has two accelerations.

## 2017-07-07 NOTE — Progress Notes (Signed)
ROB, NST and 17P inj.  NONSTRESS TEST INTERPRETATION  INDICATIONS:- subutex use  FHR baseline:  RESULTS:Reactive COMMENTS:    PLAN: 1. Continue fetal kick counts twice a day. 2. Continue antepartum testing as scheduled-Biweekly 3.  Darol Destinerystal Madaleine Simmon, CMA

## 2017-07-07 NOTE — Discharge Instructions (Signed)
Eat Sleep Console: http://www.gallegos-johnson.biz/https://www.nichq.org/insight/mother-centered-approach-treating-neonatal-abstinence-syndrome

## 2017-07-08 NOTE — Discharge Summary (Signed)
    L&D OB Triage Note  SUBJECTIVE Jill Shepherd is a 22 y.o. [phone] female at 6772w1d, EDD Estimated Date of Delivery: 08/04/17 who presented to triage with complaints of non- reactive NST in office.   OB History  Gravida Para Term Preterm AB Living  4 2 0 2 1 0  SAB TAB Ectopic Multiple Live Births  1 0 0 0 1    # Outcome Date GA Lbr Len/2nd Weight Sex Delivery Anes PTL Lv  4 Current           3 SAB 2018          2 Preterm 09/26/15 4261w3d  4 lb 10 oz (2.098 kg) M  EPI Y DEC  1 Preterm 12/2013 452w4d      Y FD    Obstetric Comments  2015 - spontaneous preterm labor, fetus passed several minutes after delivery.   2017 - Infant demise at 4 months, suspected SIDS    No medications prior to admission.     OBJECTIVE  Nursing Evaluation:   BP 110/66 (BP Location: Right Arm)   Pulse 96   Temp 98.3 F (36.8 C) (Oral)   Resp 18   LMP 10/24/2016    Findings:     NST was performed and has been reviewed by me.  NST INTERPRETATION: Category I  Mode: External Baseline Rate (A): 150 bpm Variability: Moderate Accelerations: 15 x 15 Decelerations: None     Contraction Frequency (min): none/UI  ASSESSMENT Impression:  1.  Pregnancy:  [name] at 1672w1d , EDD Estimated Date of Delivery: 08/04/17 2.  NST:  Category I  PLAN 1. Reassurance given 2. Discharge home with standard labor precautions given to return to L&D or call the office for problems. 3. Continue routine prenatal care.

## 2017-07-14 ENCOUNTER — Ambulatory Visit (INDEPENDENT_AMBULATORY_CARE_PROVIDER_SITE_OTHER): Payer: Medicaid Other | Admitting: Obstetrics and Gynecology

## 2017-07-14 ENCOUNTER — Other Ambulatory Visit (INDEPENDENT_AMBULATORY_CARE_PROVIDER_SITE_OTHER): Payer: Medicaid Other

## 2017-07-14 ENCOUNTER — Other Ambulatory Visit: Payer: Self-pay | Admitting: Obstetrics and Gynecology

## 2017-07-14 ENCOUNTER — Other Ambulatory Visit: Payer: Medicaid Other

## 2017-07-14 VITALS — BP 96/61 | HR 101 | Wt 115.1 lb

## 2017-07-14 DIAGNOSIS — O99320 Drug use complicating pregnancy, unspecified trimester: Secondary | ICD-10-CM | POA: Diagnosis not present

## 2017-07-14 DIAGNOSIS — O0993 Supervision of high risk pregnancy, unspecified, third trimester: Secondary | ICD-10-CM | POA: Diagnosis not present

## 2017-07-14 DIAGNOSIS — F1120 Opioid dependence, uncomplicated: Secondary | ICD-10-CM

## 2017-07-14 DIAGNOSIS — Z8751 Personal history of pre-term labor: Secondary | ICD-10-CM

## 2017-07-14 DIAGNOSIS — O2613 Low weight gain in pregnancy, third trimester: Secondary | ICD-10-CM

## 2017-07-14 DIAGNOSIS — O09293 Supervision of pregnancy with other poor reproductive or obstetric history, third trimester: Secondary | ICD-10-CM

## 2017-07-14 LAB — POCT URINALYSIS DIPSTICK
Bilirubin, UA: NEGATIVE
Glucose, UA: NEGATIVE
KETONES UA: NEGATIVE
Leukocytes, UA: NEGATIVE
NITRITE UA: NEGATIVE
PH UA: 6.5 (ref 5.0–8.0)
Protein, UA: POSITIVE — AB
RBC UA: NEGATIVE
SPEC GRAV UA: 1.025 (ref 1.010–1.025)
UROBILINOGEN UA: 0.2 U/dL

## 2017-07-14 NOTE — Progress Notes (Signed)
ROB-pt stated that she is having braxton hicks contractions but nothing regular. No other concerns.

## 2017-07-14 NOTE — Progress Notes (Signed)
ROB: Doing well, no major complaints. Has some [employer] CorporationBraxton Hicks. Still taking approximately 8 mg of subutex daily (but notes some days she takes less). Growth scan after today's visit. NST performed today was reviewed and was found to be reactive.  Continue recommended antenatal testing and prenatal care. RTC in 1 week.    NONSTRESS TEST INTERPRETATION  INDICATIONS: Subutex use in pregnancy  FHR baseline: 150 bpm RESULTS:Reactive COMMENTS:  Irritability with occasional contraction noted.    PLAN: 1. Continue fetal kick counts twice a day. 2. Continue antepartum testing as scheduled-weekly

## 2017-07-17 ENCOUNTER — Inpatient Hospital Stay: Payer: Medicaid Other | Admitting: Anesthesiology

## 2017-07-17 ENCOUNTER — Other Ambulatory Visit: Payer: Self-pay

## 2017-07-17 ENCOUNTER — Inpatient Hospital Stay
Admission: EM | Admit: 2017-07-17 | Discharge: 2017-07-19 | DRG: 807 | Disposition: A | Discharge status: Home / Self Care | Payer: Medicaid Other | Attending: Obstetrics and Gynecology | Admitting: Obstetrics and Gynecology

## 2017-07-17 DIAGNOSIS — D649 Anemia, unspecified: Secondary | ICD-10-CM | POA: Diagnosis present

## 2017-07-17 DIAGNOSIS — O9902 Anemia complicating childbirth: Principal | ICD-10-CM | POA: Diagnosis present

## 2017-07-17 DIAGNOSIS — O09293 Supervision of pregnancy with other poor reproductive or obstetric history, third trimester: Secondary | ICD-10-CM

## 2017-07-17 DIAGNOSIS — Z3483 Encounter for supervision of other normal pregnancy, third trimester: Secondary | ICD-10-CM | POA: Diagnosis present

## 2017-07-17 DIAGNOSIS — F1120 Opioid dependence, uncomplicated: Secondary | ICD-10-CM

## 2017-07-17 DIAGNOSIS — Z3A37 37 weeks gestation of pregnancy: Secondary | ICD-10-CM

## 2017-07-17 DIAGNOSIS — O09299 Supervision of pregnancy with other poor reproductive or obstetric history, unspecified trimester: Secondary | ICD-10-CM

## 2017-07-17 DIAGNOSIS — Z8751 Personal history of pre-term labor: Secondary | ICD-10-CM

## 2017-07-17 DIAGNOSIS — O99320 Drug use complicating pregnancy, unspecified trimester: Secondary | ICD-10-CM

## 2017-07-17 LAB — URINE DRUG SCREEN, QUALITATIVE (ARMC ONLY)
AMPHETAMINES, UR SCREEN: NOT DETECTED
BENZODIAZEPINE, UR SCRN: NOT DETECTED
Cannabinoid 50 Ng, Ur ~~LOC~~: NOT DETECTED
Cocaine Metabolite,Ur ~~LOC~~: NOT DETECTED
MDMA (Ecstasy)Ur Screen: NOT DETECTED
METHADONE SCREEN, URINE: NOT DETECTED
OPIATE, UR SCREEN: NOT DETECTED
PHENCYCLIDINE (PCP) UR S: NOT DETECTED
Tricyclic, Ur Screen: NOT DETECTED

## 2017-07-17 LAB — CBC
HEMATOCRIT: 30.3 % — AB (ref 35.0–47.0)
HEMOGLOBIN: 10.3 g/dL — AB (ref 12.0–16.0)
MCH: 27.3 pg (ref 26.0–34.0)
MCHC: 34.1 g/dL (ref 32.0–36.0)
MCV: 80 fL (ref 80.0–100.0)
Platelets: 167 10*3/uL (ref 150–440)
RBC: 3.78 MIL/uL — ABNORMAL LOW (ref 3.80–5.20)
RDW: 13.7 % (ref 11.5–14.5)
WBC: 14.8 10*3/uL — ABNORMAL HIGH (ref 3.6–11.0)

## 2017-07-17 LAB — TYPE AND SCREEN
ABO/RH(D): A POS
Antibody Screen: NEGATIVE

## 2017-07-17 MED ORDER — ONDANSETRON HCL 4 MG PO TABS: 4.0000 mg | ORAL_TABLET | ORAL | Status: DC | PRN

## 2017-07-17 MED ORDER — OXYTOCIN 10 UNIT/ML IJ SOLN
INTRAMUSCULAR | Status: AC
  Filled 2017-07-17: qty 2

## 2017-07-17 MED ORDER — LACTATED RINGERS IV SOLN
INTRAVENOUS | Status: DC
  Administered 2017-07-17: 07:00:00 via INTRAVENOUS

## 2017-07-17 MED ORDER — PHENYLEPHRINE 40 MCG/ML (10ML) SYRINGE FOR IV PUSH (FOR BLOOD PRESSURE SUPPORT): 80.0000 ug | PREFILLED_SYRINGE | INTRAVENOUS | Status: DC | PRN

## 2017-07-17 MED ORDER — FENTANYL 2.5 MCG/ML W/ROPIVACAINE 0.15% IN NS 100 ML EPIDURAL (ARMC): 12.0000 mL/h | EPIDURAL | Status: DC

## 2017-07-17 MED ORDER — OXYTOCIN 40 UNITS IN LACTATED RINGERS INFUSION - SIMPLE MED
INTRAVENOUS | Status: AC
  Administered 2017-07-17: 500 mL via INTRAVENOUS
  Filled 2017-07-17: qty 1000

## 2017-07-17 MED ORDER — BUPRENORPHINE HCL 2 MG SL SUBL
2.0000 mg | SUBLINGUAL_TABLET | Freq: Three times a day (TID) | SUBLINGUAL | Status: DC
  Administered 2017-07-17 – 2017-07-19 (×9): 2 mg via SUBLINGUAL
  Filled 2017-07-17 (×9): qty 1

## 2017-07-17 MED ORDER — WITCH HAZEL-GLYCERIN EX PADS: 1.0000 "application " | MEDICATED_PAD | CUTANEOUS | Status: DC | PRN

## 2017-07-17 MED ORDER — NON FORMULARY: 2.0000 mg | Freq: Every day | Status: DC

## 2017-07-17 MED ORDER — AMMONIA AROMATIC IN INHA
RESPIRATORY_TRACT | Status: AC
  Filled 2017-07-17: qty 10

## 2017-07-17 MED ORDER — OXYCODONE-ACETAMINOPHEN 5-325 MG PO TABS: 2.0000 | ORAL_TABLET | ORAL | Status: DC | PRN

## 2017-07-17 MED ORDER — LACTATED RINGERS IV SOLN: 500.0000 mL | INTRAVENOUS | Status: DC | PRN

## 2017-07-17 MED ORDER — LACTATED RINGERS IV SOLN: 500.0000 mL | Freq: Once | INTRAVENOUS | Status: DC

## 2017-07-17 MED ORDER — BUPRENORPHINE HCL 2 MG SL SUBL
2.0000 mg | SUBLINGUAL_TABLET | Freq: Every day | SUBLINGUAL | Status: DC
  Filled 2017-07-17: qty 1

## 2017-07-17 MED ORDER — PRENATAL MULTIVITAMIN CH
1.0000 | ORAL_TABLET | Freq: Every day | ORAL | Status: DC
  Administered 2017-07-17 – 2017-07-19 (×3): 1 via ORAL
  Filled 2017-07-17 (×3): qty 1

## 2017-07-17 MED ORDER — SOD CITRATE-CITRIC ACID 500-334 MG/5ML PO SOLN
30.0000 mL | ORAL | Status: DC | PRN
  Administered 2017-07-17: 30 mL via ORAL
  Filled 2017-07-17: qty 15

## 2017-07-17 MED ORDER — LIDOCAINE HCL (PF) 1 % IJ SOLN
30.0000 mL | INTRAMUSCULAR | Status: AC | PRN
  Administered 2017-07-17: 3 mL via SUBCUTANEOUS

## 2017-07-17 MED ORDER — ACETAMINOPHEN 325 MG PO TABS
650.0000 mg | ORAL_TABLET | ORAL | Status: DC | PRN
  Administered 2017-07-17 – 2017-07-18 (×4): 650 mg via ORAL
  Filled 2017-07-17 (×4): qty 2

## 2017-07-17 MED ORDER — IBUPROFEN 600 MG PO TABS
600.0000 mg | ORAL_TABLET | Freq: Four times a day (QID) | ORAL | Status: DC
  Administered 2017-07-17: 600 mg via ORAL
  Filled 2017-07-17: qty 1

## 2017-07-17 MED ORDER — FLEET ENEMA 7-19 GM/118ML RE ENEM: 1.0000 | ENEMA | RECTAL | Status: DC | PRN

## 2017-07-17 MED ORDER — EPHEDRINE 5 MG/ML INJ: 10.0000 mg | INTRAVENOUS | Status: DC | PRN

## 2017-07-17 MED ORDER — OXYTOCIN 40 UNITS IN LACTATED RINGERS INFUSION - SIMPLE MED
2.5000 [IU]/h | INTRAVENOUS | Status: DC
  Administered 2017-07-17: 2.5 [IU]/h via INTRAVENOUS

## 2017-07-17 MED ORDER — COCONUT OIL OIL: 1.0000 "application " | TOPICAL_OIL | Status: DC | PRN

## 2017-07-17 MED ORDER — ONDANSETRON HCL 4 MG/2ML IJ SOLN
4.0000 mg | Freq: Four times a day (QID) | INTRAMUSCULAR | Status: DC | PRN
  Administered 2017-07-17: 4 mg via INTRAVENOUS
  Filled 2017-07-17: qty 2

## 2017-07-17 MED ORDER — ZOLPIDEM TARTRATE 5 MG PO TABS: 5.0000 mg | ORAL_TABLET | Freq: Every evening | ORAL | Status: DC | PRN

## 2017-07-17 MED ORDER — SIMETHICONE 80 MG PO CHEW
80.0000 mg | CHEWABLE_TABLET | ORAL | Status: DC | PRN
  Filled 2017-07-17: qty 1

## 2017-07-17 MED ORDER — BUPIVACAINE HCL (PF) 0.25 % IJ SOLN
INTRAMUSCULAR | Status: DC | PRN
  Administered 2017-07-17 (×2): 4 mL via EPIDURAL

## 2017-07-17 MED ORDER — LIDOCAINE-EPINEPHRINE (PF) 1.5 %-1:200000 IJ SOLN
INTRAMUSCULAR | Status: DC | PRN
  Administered 2017-07-17: 3 mL via EPIDURAL

## 2017-07-17 MED ORDER — DIBUCAINE 1 % RE OINT: 1.0000 "application " | TOPICAL_OINTMENT | RECTAL | Status: DC | PRN

## 2017-07-17 MED ORDER — MISOPROSTOL 200 MCG PO TABS
ORAL_TABLET | ORAL | Status: AC
  Filled 2017-07-17: qty 4

## 2017-07-17 MED ORDER — DIPHENHYDRAMINE HCL 50 MG/ML IJ SOLN: 12.5000 mg | INTRAMUSCULAR | Status: DC | PRN

## 2017-07-17 MED ORDER — ONDANSETRON HCL 4 MG/2ML IJ SOLN: 4.0000 mg | INTRAMUSCULAR | Status: DC | PRN

## 2017-07-17 MED ORDER — ACETAMINOPHEN 325 MG PO TABS: 650.0000 mg | ORAL_TABLET | ORAL | Status: DC | PRN

## 2017-07-17 MED ORDER — LIDOCAINE HCL (PF) 1 % IJ SOLN
INTRAMUSCULAR | Status: AC
  Filled 2017-07-17: qty 30

## 2017-07-17 MED ORDER — IBUPROFEN 600 MG PO TABS
600.0000 mg | ORAL_TABLET | Freq: Four times a day (QID) | ORAL | Status: DC
  Administered 2017-07-17 – 2017-07-19 (×9): 600 mg via ORAL
  Filled 2017-07-17 (×9): qty 1

## 2017-07-17 MED ORDER — FENTANYL 2.5 MCG/ML W/ROPIVACAINE 0.15% IN NS 100 ML EPIDURAL (ARMC)
EPIDURAL | Status: DC | PRN
  Administered 2017-07-17: 12 mL/h via EPIDURAL

## 2017-07-17 MED ORDER — OXYCODONE-ACETAMINOPHEN 5-325 MG PO TABS: 1.0000 | ORAL_TABLET | ORAL | Status: DC | PRN

## 2017-07-17 MED ORDER — DIPHENHYDRAMINE HCL 25 MG PO CAPS: 25.0000 mg | ORAL_CAPSULE | Freq: Four times a day (QID) | ORAL | Status: DC | PRN

## 2017-07-17 MED ORDER — SENNOSIDES-DOCUSATE SODIUM 8.6-50 MG PO TABS
2.0000 | ORAL_TABLET | ORAL | Status: DC
  Administered 2017-07-18 – 2017-07-19 (×2): 2 via ORAL
  Filled 2017-07-17 (×3): qty 2

## 2017-07-17 MED ORDER — BENZOCAINE-MENTHOL 20-0.5 % EX AERO
1.0000 "application " | INHALATION_SPRAY | CUTANEOUS | Status: DC | PRN
  Administered 2017-07-17 – 2017-07-18 (×2): 1 via TOPICAL
  Filled 2017-07-17: qty 56

## 2017-07-17 MED ORDER — OXYTOCIN BOLUS FROM INFUSION
500.0000 mL | Freq: Once | INTRAVENOUS | Status: AC
  Administered 2017-07-17: 500 mL via INTRAVENOUS

## 2017-07-17 MED ORDER — FENTANYL 2.5 MCG/ML W/ROPIVACAINE 0.15% IN NS 100 ML EPIDURAL (ARMC)
EPIDURAL | Status: AC
  Filled 2017-07-17: qty 100

## 2017-07-17 NOTE — Anesthesia Procedure Notes (Signed)
Epidural Patient location during procedure: OB  Staffing Performed: anesthesiologist   Preanesthetic Checklist Completed: patient identified, site marked, surgical consent, pre-op evaluation, timeout performed, IV checked, risks and benefits discussed and monitors and equipment checked  Epidural Patient position: sitting Prep: Betadine Patient monitoring: heart rate, continuous pulse ox and blood pressure Approach: midline Location: L4-L5 Injection technique: LOR saline  Needle:  Needle type: Tuohy  Needle gauge: 17 G Needle length: 9 cm and 9 Needle insertion depth: 4 (3) and 3 cm Catheter type: closed end flexible Catheter size: 19 Gauge Test dose: negative and 1.5% lidocaine with Epi 1:200 K  Assessment Sensory level: T10 Events: blood not aspirated, injection not painful, no injection resistance, negative IV test and no paresthesia  Additional Notes  Pt with significant kyphoscoliosis. Pt notified of this and unaware.  Risks and benefits of epidural discussed with pt.  Accepted.   Patient tolerated the insertion well without complications.-SATD -IVTD. No paresthesia. [facility] for VS and dosingReason for block:procedure for pain

## 2017-07-17 NOTE — Progress Notes (Signed)
During shift change bedside report both RNs at bedside; pt did say she has her own subutex in her purse; both RNs discussed keeping subutex at the nurses' station locked in her drawer in the med cart; pt ok with that and pt handed bottle of subutex to RNs; two RNs counted pills in med room; there are 23 whole pills, 2 1/2 pills and 2 pills with tiny parts bitten off; RNs labeled bottle with chart label and locked in med drawer; Gean QuintB. Ivon Roedel, RN and Harless LittenKendra Pierce, RN counted pills; pills are 8mg  each

## 2017-07-17 NOTE — Progress Notes (Signed)
Pt requests epidural.  [id number] Dr Pernell DupreAdams notified 0617 [facility] Test dose by MD  Maternal HR 112 0632 Bolus 1  0634 Bolus 2 0635Drip started

## 2017-07-17 NOTE — H&P (Signed)
Obstetric History and Physical  Jill Shepherd is a 22 y.o. [phone] with IUP at 7146w3d presenting for complaints of contractions. Patient states she has been having regular contractions, every 3-4 minutes, none vaginal bleeding, possible rupture of membranes (clear fluid), with active fetal movement.    Prenatal Course Source of Care: [facility] with onset of care at 7 weeks Pregnancy complications or risks: Patient Active Problem List   Diagnosis Date Noted  . Labor and delivery indication for care or intervention 07/17/2017  . Pregnancy 07/07/2017  . Labor and delivery, indication for care 06/23/2017  . History of preterm delivery 11/28/2016  . Pregnancy complicated by subutex maintenance, antepartum (HCC) 11/28/2016  . History of preterm premature rupture of membranes (PPROM) 09/25/2015  . History of pregnancy loss in prior pregnancy, currently pregnant 05/14/2015  . History of drug use 05/14/2015  . Underweight 05/14/2015   She plans to breast and bottle feeds.  She desires unsure method for postpartum contraception.   Prenatal labs and studies: ABO, Rh: --/--/A POS (06/21 0516) Antibody: NEG (06/21 0516) Rubella: 2.02 (11/16 0936) RPR: Non Reactive (04/09 1327)  HBsAg: Negative (11/16 0936)  HIV: Non Reactive (11/16 0936)  AVW:UJWJXBJYGBS:Negative (06/04 1650) 1 hr Glucola  normal Genetic screening normal Anatomy US normal   Past Medical History:  Diagnosis Date  . Headache   . SAB (spontaneous abortion) 01/19/2014   stillborn at 20.4 wks    History reviewed. No pertinent surgical history.    OB History  Gravida Para Term Preterm AB Living  4 2   2 1  0  SAB TAB Ectopic Multiple Live Births  1       1    # Outcome Date GA Lbr Len/2nd Weight Sex Delivery Anes PTL Lv  4 Current           3 SAB 2018          2 Preterm 09/26/15 4735w3d  4 lb 10 oz (2.098 kg) M  EPI Y DEC  1 Preterm 12/2013 7075w4d      Y FD    Obstetric Comments  2015 - spontaneous preterm labor,  fetus passed several minutes after delivery.   2017 - Infant demise at 4 months, suspected SIDS    Social History   Socioeconomic History  . Marital status: Single    Spouse name: Not on file  . Number of children: Not on file  . Years of education: Not on file  . Highest education level: Not on file  Occupational History    Employer: [employer]  Social Needs  . Financial resource strain: Not on file  . Food insecurity:    Worry: Not on file    Inability: Not on file  . Transportation needs:    Medical: Not on file    Non-medical: Not on file  Tobacco Use  . Smoking status: Never Smoker  . Smokeless tobacco: Never Used  Substance and Sexual Activity  . Alcohol use: No    Alcohol/week: 0.0 oz  . Drug use: Yes    Types: Other-see comments    Comment: subutex tx  . Sexual activity: Yes    Partners: Male    Birth control/protection: None  Lifestyle  . Physical activity:    Days per week: Not on file    Minutes per session: Not on file  . Stress: Not on file  Relationships  . Social connections:    Talks on phone: Not on file  Gets together: Not on file    Attends religious service: Not on file    Active member of club or organization: Not on file    Attends meetings of clubs or organizations: Not on file    Relationship status: Not on file  Other Topics Concern  . Not on file  Social History Narrative  . Not on file    Family History  Problem Relation Age of Onset  . Hypertension Father   . Hypertension Paternal Grandfather     Facility-Administered Medications Prior to Admission  Medication Dose Route Frequency Provider Last Rate Last Dose  . hydroxyprogesterone caproate (MAKENA) 250 mg/mL injection 250 mg  250 mg Intramuscular Once Hildred Laser, MD       Medications Prior to Admission  Medication Sig Dispense Refill Last Dose  . buprenorphine (SUBUTEX) 8 MG SUBL SL tablet Place 8 mg under the tongue daily.    07/16/2017 at Unknown time  .  hydroxyprogesterone caproate (MAKENA) 250 mg/mL OIL injection Inject 250 mg into the muscle once.   Past Week at Unknown time  . ondansetron (ZOFRAN) 4 MG tablet Take 1 tablet (4 mg total) by mouth every 8 (eight) hours as needed for nausea or vomiting. 30 tablet 2 Past Month at Unknown time  . Prenatal Vit-Fe Fumarate-FA (PRENATAL MULTIVITAMIN) TABS tablet Take 1 tablet by mouth daily at 12 noon.   Past Month at Unknown time  . ranitidine (ZANTAC) 150 MG tablet Take 1 tablet (150 mg total) by mouth 2 (two) times daily. 60 tablet 4 Past Month at Unknown time  . vitamin B-6 (PYRIDOXINE) 25 MG tablet Take 25 mg by mouth daily.   Past Month at Unknown time    No Known Allergies  Review of Systems: Negative except for what is mentioned in HPI.  Physical Exam: BP 115/76   Pulse (!) 152   Temp 98.7 F (37.1 C) (Oral)   Resp 16   Ht 5\' 2"  (1.575 m)   Wt 115 lb (52.2 kg)   LMP 10/24/2016   BMI 21.03 kg/m  CONSTITUTIONAL: Well-developed, well-nourished female in no acute distress.  HENT:  Normocephalic, atraumatic, External right and left ear normal. Oropharynx is clear and moist EYES: Conjunctivae and EOM are normal. Pupils are equal, round, and reactive to light. No scleral icterus.  NECK: Normal range of motion, supple, no masses SKIN: Skin is warm and dry. No rash noted. Not diaphoretic. No erythema. No pallor. NEUROLOGIC: Alert and oriented to person, place, and time. Normal reflexes, muscle tone coordination. No cranial nerve deficit noted. PSYCHIATRIC: Normal mood and affect. Normal behavior. Normal judgment and thought content. CARDIOVASCULAR: Normal heart rate noted, regular rhythm RESPIRATORY: Effort and breath sounds normal, no problems with respiration noted ABDOMEN: Soft, nontender, nondistended, gravid. MUSCULOSKELETAL: Normal range of motion. No edema and no tenderness. 2+ distal pulses.  Cervical Exam: Dilatation 8 cm   Effacement 100%   Station -1   Presentation:  cephalic FHT:  Baseline rate 140 bpm.   Variability moderate  Accelerations present   Decelerations none Contractions: Every 2-3 mins   Pertinent Labs/Studies:   Results for orders placed or performed during the hospital encounter of 07/17/17 (from the past 24 hour(s))  CBC     Status: Abnormal   Collection Time: 07/17/17  5:16 AM  Result Value Ref Range   WBC 14.8 (H) 3.6 - 11.0 K/uL   RBC 3.78 (L) 3.80 - 5.20 MIL/uL   Hemoglobin 10.3 (L) 12.0 - 16.0 g/dL  HCT 30.3 (L) 35.0 - 47.0 %   MCV 80.0 80.0 - 100.0 fL   MCH 27.3 26.0 - 34.0 pg   MCHC 34.1 32.0 - 36.0 g/dL   RDW 16.1 09.6 - 04.5 %   Platelets 167 150 - 440 K/uL  Type and screen [facility]     Status: None   Collection Time: 07/17/17  5:16 AM  Result Value Ref Range   ABO/RH(D) A POS    Antibody Screen NEG    Sample Expiration      07/20/2017 Performed at [facility], [address], [place], [place] [address]   Urine Drug Screen, Qualitative ([facility] only)     Status: Abnormal   Collection Time: 07/17/17  5:18 AM  Result Value Ref Range   Tricyclic, Ur Screen NONE DETECTED NONE DETECTED   Amphetamines, Ur Screen NONE DETECTED NONE DETECTED   MDMA (Ecstasy)Ur Screen NONE DETECTED NONE DETECTED   Cocaine Metabolite,Ur [place] NONE DETECTED NONE DETECTED   Opiate, Ur Screen NONE DETECTED NONE DETECTED   Phencyclidine (PCP) Ur S NONE DETECTED NONE DETECTED   Cannabinoid 50 Ng, Ur  NONE DETECTED NONE DETECTED   Barbiturates, Ur Screen (A) NONE DETECTED    Result not available. Reagent lot number recalled by manufacturer.   Benzodiazepine, Ur Scrn NONE DETECTED NONE DETECTED   Methadone Scn, Ur NONE DETECTED NONE DETECTED    Assessment : WHITTLEY CARANDANG is a 22 y.o. [phone] at [redacted]w[redacted]d being admitted for labor.  Pregnancy complicated by h/o preterm delivery, subutex use.   Plan: Labor: AROM with clear fluid.  Continue expectant management. Patient with epidural in place, comfortable.  FWB:  Reassuring fetal heart tracing.  GBS negative Delivery plan: Hopeful for vaginal delivery soon.   Hildred Laser, MD [facility]

## 2017-07-17 NOTE — Anesthesia Preprocedure Evaluation (Signed)
Anesthesia Evaluation  Patient identified by MRN, date of birth, ID band Patient awake    Reviewed: Allergy & Precautions, H&P , NPO status , Patient's Chart, lab work & pertinent test results, reviewed documented beta blocker date and time   Airway Mallampati: II  TM Distance: >3 FB Neck ROM: full    Dental no notable dental hx. (+) Teeth Intact   Pulmonary neg pulmonary ROS, Current Smoker,    Pulmonary exam normal breath sounds clear to auscultation       Cardiovascular Exercise Tolerance: Good negative cardio ROS   Rhythm:regular Rate:Normal     Neuro/Psych negative neurological ROS  negative psych ROS   GI/Hepatic negative GI ROS, Neg liver ROS,   Endo/Other  negative endocrine ROSdiabetes  Renal/GU      Musculoskeletal   Abdominal   Peds  Hematology negative hematology ROS (+)   Anesthesia Other Findings   Reproductive/Obstetrics (+) Pregnancy                             Anesthesia Physical Anesthesia Plan  ASA: II  Anesthesia Plan: Epidural   Post-op Pain Management:    Induction:   PONV Risk Score and Plan:   Airway Management Planned:   Additional Equipment:   Intra-op Plan:   Post-operative Plan:   Informed Consent: I have reviewed the patients History and Physical, chart, labs and discussed the procedure including the risks, benefits and alternatives for the proposed anesthesia with the patient or authorized representative who has indicated his/her understanding and acceptance.       Plan Discussed with:   Anesthesia Plan Comments:         Anesthesia Quick Evaluation  

## 2017-07-17 NOTE — OB Triage Note (Signed)
Pt presents to L&D with c/o contractions since 1 am q 5 min or less. Reports good fetal movement, denies LOF and reports vaginal bleeding (small amounts of pink discharge) after sex tonight. EFM applied and explained. Plan to monitor fetal and maternal well being and assess for labor.

## 2017-07-18 LAB — CBC
HCT: 31.9 % — ABNORMAL LOW (ref 35.0–47.0)
Hemoglobin: 10.7 g/dL — ABNORMAL LOW (ref 12.0–16.0)
MCH: 26.7 pg (ref 26.0–34.0)
MCHC: 33.6 g/dL (ref 32.0–36.0)
MCV: 79.6 fL — AB (ref 80.0–100.0)
Platelets: 187 10*3/uL (ref 150–440)
RBC: 4 MIL/uL (ref 3.80–5.20)
RDW: 13.8 % (ref 11.5–14.5)
WBC: 16.7 10*3/uL — AB (ref 3.6–11.0)

## 2017-07-18 LAB — RPR: RPR: NONREACTIVE

## 2017-07-18 NOTE — Lactation Note (Signed)
This note was copied from a baby's chart. Lactation Consultation Note  Patient Name: Jill Shepherd Reason for consult: Follow-up assessment;Early term 6937-38.6wks Mom wanting to pump and bottlefeed right now.  Mom pumped 10 ml and given via bottle.  Mom praised for supplying breast milk since mom getting subutex.  Reviewed supply and demand and need to stimulate breasts to bring in mature milk and ensure a plentiful milk supply.  Explained normal course of lactation and routine newborn feeding pattern.  Lactation name and number written on white board and encouraged to call for questions, concerns or assistance.  Maternal Data Formula Feeding for Exclusion: No Has patient been taught Hand Expression?: Yes  Feeding Feeding Type: Breast Milk Nipple Type: Slow - flow Length of feed: 5 min  LATCH Score                   Interventions Interventions: Breast feeding basics reviewed;Support pillows;Hand express;Expressed milk;DEBP  Lactation Tools Discussed/Used WIC Program: Yes Pump Review: Setup, frequency, and cleaning;Milk Storage;Other (comment)   Consult Status Consult Status: PRN Date: 07/18/17 Follow-up type: Call as needed    Louis MeckelWilliams, Somaya Grassi Kay Shepherd, 3:29 PM

## 2017-07-18 NOTE — Plan of Care (Signed)
Vs stable; up ad lib; talking PO tylenol and PO motrin for pain control; taking subutex; breast feeding baby well; also pumping and getting colostrum; pt has some pumped milk in the breast milk fridge

## 2017-07-18 NOTE — Progress Notes (Signed)
Post Partum Day # 1, s/p SVD  Subjective: no complaints, up ad lib, voiding and tolerating PO  Objective: Temp:  [97.7 F (36.5 C)-99.2 F (37.3 C)] 97.7 F (36.5 C) (06/22 0849) Pulse Rate:  [61-107] 64 (06/22 0849) Resp:  [14-20] 18 (06/22 0849) BP: (113-129)/(56-83) 113/70 (06/22 0849) SpO2:  [98 %-100 %] 98 % (06/22 0849)  Physical Exam:  General: alert and no distress  Lungs: clear to auscultation bilaterally Breasts: normal appearance, no masses or tenderness Heart: regular rate and rhythm, S1, S2 normal, no murmur, click, rub or gallop Abdomen: soft, non-tender; bowel sounds normal; no masses,  no organomegaly Pelvis: Lochia: appropriate, Uterine Fundus: firm Extremities: DVT Evaluation: No evidence of DVT seen on physical exam. Negative Homan's sign. No cords or calf tenderness. No significant calf/ankle edema.  Recent Labs    07/17/17 0516 07/18/17 0552  HGB 10.3* 10.7*  HCT 30.3* 31.9*    Assessment/Plan: Doing well postpartum. Breastfeeding, s/p Lactation consult  Contraception OCPs Continue Subutex 2 mg TID with meals and bedtime. Baby being monitored under NAS precautions.  Mild anemia of pregnancy. Asymptomatic.  Continue iron in PNV.    LOS: 1 day   Hildred Laserherry, Damieon Armendariz, MD [facility] 07/18/2017 12:31 PM

## 2017-07-18 NOTE — Anesthesia Postprocedure Evaluation (Signed)
Anesthesia Post Note  Patient: Jill Shepherd  Procedure(s) Performed: AN AD HOC LABOR EPIDURAL  Patient location during evaluation: Mother Baby Anesthesia Type: Epidural Level of consciousness: awake and alert Pain management: pain level controlled Vital Signs Assessment: post-procedure vital signs reviewed and stable Respiratory status: spontaneous breathing, nonlabored ventilation and respiratory function stable Cardiovascular status: stable Postop Assessment: no headache, no backache, epidural receding, patient able to bend at knees and able to ambulate Anesthetic complications: no     Last Vitals:  Vitals:   07/18/17 0350 07/18/17 0849  BP: 115/72 113/70  Pulse: 67 64  Resp: 20 18  Temp: 36.6 C 36.5 C  SpO2: 98% 98%    Last Pain:  Vitals:   07/18/17 0849  TempSrc: Oral  PainSc:                  Cleda MccreedyJoseph K Coletta Lockner

## 2017-07-19 MED ORDER — IBUPROFEN 800 MG PO TABS: 800.0000 mg | ORAL_TABLET | Freq: Three times a day (TID) | ORAL | 1 refills | Status: DC | PRN

## 2017-07-19 MED ORDER — MEDROXYPROGESTERONE ACETATE 150 MG/ML IM SUSP
150.0000 mg | Freq: Once | INTRAMUSCULAR | Status: DC
  Filled 2017-07-19: qty 1

## 2017-07-19 NOTE — Progress Notes (Signed)
Pt discharged with infant.  Discharge instructions, prescriptions and follow up appointment given to and reviewed with pt. Pt verbalized understanding. Escorted out by auxillary. 

## 2017-07-19 NOTE — Plan of Care (Signed)
Pt. Has been alert and oriented with pleasant affect. Color good, skin w&d. BBS clear. Up ambulating with steady gait. Fundus firm at U/-1 Scant to light Lochia. Pain control with scheduled ibuprofen. Demonstrating aprop. Care of self.

## 2017-07-19 NOTE — Progress Notes (Signed)
Post Partum Day # 2, s/p SVD  Subjective: no complaints, up ad lib, voiding and tolerating PO  Objective: Vitals:   07/18/17 0849 07/18/17 1729 07/19/17 0044 07/19/17 0737  BP: 113/70 111/77 (!) 98/51 112/72  Pulse: 64 64 (!) 57 60  Resp: 18 20 16 18   Temp: 97.7 F (36.5 C) 98.2 F (36.8 C) 98.3 F (36.8 C) 98 F (36.7 C)  TempSrc: Oral Oral Oral Oral  SpO2: 98%  97% 99%  Weight:      Height:        Physical Exam:  General: alert and no distress  Lungs: clear to auscultation bilaterally Breasts: normal appearance, no masses or tenderness Heart: regular rate and rhythm, S1, S2 normal, no murmur, click, rub or gallop Abdomen: soft, non-tender; bowel sounds normal; no masses,  no organomegaly Pelvis: Lochia: appropriate, Uterine Fundus: firm Extremities: DVT Evaluation: No evidence of DVT seen on physical exam. Negative Homan's sign. No cords or calf tenderness. No significant calf/ankle edema.  Recent Labs    07/17/17 0516 07/18/17 0552  HGB 10.3* 10.7*  HCT 30.3* 31.9*    Assessment/Plan: Doing well postpartum. Breastfeeding/pumping, s/p Lactation consult  Contraception now desires Depo Provera. Will administer 1 dose prior to discharge.  Continue Subutex 2 mg TID with meals and bedtime. Baby being monitored under NAS precautions.  Mild anemia of pregnancy. Asymptomatic.  Continue iron in PNV.  Patient to be discharged, infant to remain inpatient for NAS screen for 1-3 more days. Rooming in.    LOS: 2 days   Hildred Laserherry, Rece Zechman, MD [facility] 07/19/2017 9:49 AM

## 2017-07-19 NOTE — Progress Notes (Addendum)
Afeb. VSS. Assessment WNL. Pt. Denies c/o. V/O of D/C Instructions by K. Cipriano BunkerPierce RN and F/U appointment. Pt. Is staying in room 346 with Infant who is having ESC Assessments. Significant other is at the bedside and is supportive.

## 2017-07-19 NOTE — Discharge Summary (Signed)
OB Discharge Summary     Patient Name: Jill Shepherd DOB: 02/13/1995 MRN: [id number]  Date of admission: 07/17/2017 Delivering MD: Hildred LaserHERRY, Oluwaseun Cremer   Date of discharge: 07/19/2017  Admitting diagnosis: Contractions Intrauterine pregnancy: 7264w3d     Secondary diagnosis:  Active Problems:   History of pregnancy loss in prior pregnancy, currently pregnant   History of preterm delivery   Pregnancy complicated by subutex maintenance, antepartum (HCC)   Labor and delivery indication for care or intervention   SVD (spontaneous vaginal delivery)  Additional problems: Anemia of pregnancy     Discharge diagnosis: Term Pregnancy Delivered and Anemia                                                                                                Post partum procedures:None  Augmentation: AROM  Complications: None  Hospital course:  Onset of Labor With Vaginal Delivery     22 y.o. yo [phone] at 4064w3d was admitted in Active Labor on 07/17/2017. Patient had an uncomplicated labor course as follows:  Membrane Rupture Time/Date: 8:15 AM ,07/17/2017   Intrapartum Procedures: Episiotomy: None [1]                                         Lacerations:    None Patient had a delivery of a Viable infant. 07/17/2017  Information for the patient's newborn:  Rudi CocoDixon, Boy Avionna [[id number]][[id number]]  Delivery Method: Vag-Spont    Pateint had an uncomplicated postpartum course.  She is ambulating, tolerating a regular diet, passing flatus, and urinating well. Patient is discharged home in stable condition on 07/19/17.   Physical exam  Vitals:   07/18/17 0849 07/18/17 1729 07/19/17 0044 07/19/17 0737  BP: 113/70 111/77 (!) 98/51 112/72  Pulse: 64 64 (!) 57 60  Resp: 18 20 16 18   Temp: 97.7 F (36.5 C) 98.2 F (36.8 C) 98.3 F (36.8 C) 98 F (36.7 C)  TempSrc: Oral Oral Oral Oral  SpO2: 98%  97% 99%  Weight:      Height:       General: alert and no distress Lochia: appropriate Uterine Fundus:  firm Incision: N/A DVT Evaluation: No evidence of DVT seen on physical exam. Negative Homan's sign. No cords or calf tenderness. No significant calf/ankle edema. Labs: Lab Results  Component Value Date   WBC 16.7 (H) 07/18/2017   HGB 10.7 (L) 07/18/2017   HCT 31.9 (L) 07/18/2017   MCV 79.6 (L) 07/18/2017   PLT 187 07/18/2017   CMP Latest Ref Rng & Units 11/09/2016  Glucose 65 - 99 mg/dL 70  BUN 6 - 20 mg/dL 17  Creatinine 6.960.44 - 2.951.00 mg/dL 2.840.82  Sodium 132135 - 440145 mmol/L 139  Potassium 3.5 - 5.1 mmol/L 3.6  Chloride 101 - 111 mmol/L 103  CO2 22 - 32 mmol/L 27  Calcium 8.9 - 10.3 mg/dL 9.7  Total Protein 6.5 - 8.1 g/dL 8.1  Total Bilirubin 0.3 - 1.2 mg/dL 1.0  Alkaline Phos 38 - 126 U/L  64  AST 15 - 41 U/L 27  ALT 14 - 54 U/L 15    Discharge instruction: per After Visit Summary and "Baby and Me Booklet".  After visit meds:  Allergies as of 07/19/2017   No Known Allergies     Medication List    STOP taking these medications   hydroxyprogesterone caproate 250 mg/mL Oil injection Commonly known as:  MAKENA   ondansetron 4 MG tablet Commonly known as:  ZOFRAN   ranitidine 150 MG tablet Commonly known as:  ZANTAC   vitamin B-6 25 MG tablet Commonly known as:  pyridOXINE     TAKE these medications   buprenorphine 8 MG Subl SL tablet Commonly known as:  SUBUTEX Place 8 mg under the tongue daily.   ibuprofen 800 MG tablet Commonly known as:  ADVIL,MOTRIN Take 1 tablet (800 mg total) by mouth every 8 (eight) hours as needed.   prenatal multivitamin Tabs tablet Take 1 tablet by mouth daily at 12 noon.       Diet: routine diet  Activity: Advance as tolerated. Pelvic rest for 6 weeks.   Outpatient follow up:2 weeks Follow up Appt: Future Appointments  Date Time Provider Department Center  07/21/2017 10:00 AM [facility] ROOM [facility] None  07/21/2017 10:30 AM Linzie Collin, MD [facility] None   Follow up Visit:No follow-ups on file.  Postpartum  contraception: Depo Provera  Newborn Data: Live born female  Birth Weight: 6 lb 7 oz (2920 g) APGAR: 8, 9  Newborn Delivery   Birth date/time:  07/17/2017 11:54:00 Delivery type:  Vaginal, Spontaneous     Baby Feeding: Breast Disposition:rooming in   07/19/2017 Hildred Laser, MD  [facility]

## 2017-07-20 ENCOUNTER — Ambulatory Visit: Payer: Self-pay

## 2017-07-20 NOTE — Lactation Note (Signed)
This note was copied from a baby's chart. Lactation Consultation Note  Patient Name: Jill Shepherd [name] ZOXWR'UToday's Date: 07/20/2017   [facility] available for mom of baby, will need to come by and pick pump up before 4pm, mom of baby informed of this and that she would need to tell [facility] that she is there for the pump and not to recertify today, mom and father of the baby both informed of this, Medela breastmilk storage magnet given to mom and breastmilk storage and thawing of breastmilk discussed and questions answered.    Maternal Data    Feeding    LATCH Score                   Interventions    Lactation Tools Discussed/Used     Consult Status      Dyann KiefMarsha D Takoda Janowiak 07/20/2017, 12:43 PM

## 2017-07-21 ENCOUNTER — Encounter: Payer: Medicaid Other | Admitting: Obstetrics and Gynecology

## 2017-07-21 ENCOUNTER — Other Ambulatory Visit: Payer: Medicaid Other

## 2017-08-04 ENCOUNTER — Encounter (INDEPENDENT_AMBULATORY_CARE_PROVIDER_SITE_OTHER): Payer: Self-pay

## 2017-08-04 ENCOUNTER — Ambulatory Visit (INDEPENDENT_AMBULATORY_CARE_PROVIDER_SITE_OTHER): Payer: Medicaid Other | Admitting: Obstetrics and Gynecology

## 2017-08-04 ENCOUNTER — Encounter: Payer: Self-pay | Admitting: Obstetrics and Gynecology

## 2017-08-04 VITALS — BP 109/72 | HR 84 | Ht 62.0 in | Wt 98.0 lb

## 2017-08-04 DIAGNOSIS — F419 Anxiety disorder, unspecified: Secondary | ICD-10-CM | POA: Insufficient documentation

## 2017-08-04 DIAGNOSIS — O99345 Other mental disorders complicating the puerperium: Secondary | ICD-10-CM

## 2017-08-04 DIAGNOSIS — F53 Postpartum depression: Secondary | ICD-10-CM

## 2017-08-04 NOTE — Progress Notes (Signed)
Pt stated that she is doing well. EPDS= 21

## 2017-08-04 NOTE — Progress Notes (Signed)
    OBSTETRICS/GYNECOLOGY PROGRESS NOTE  Subjective:    Patient ID: Jill Shepherd, female    DOB: 09/03/1995, 22 y.o.   MRN: [id number]  HPI  Patient is a 22 y.o. [phone] female who presents for 2 week postpartum follow up for postpartum depression screen.  Patient notes that she has been very emotional and has been having mood swings. States that her family has noted that she has "been a bit of a handful lately". Patient notes that she is also very anxious regarding bonding with they baby as she is terrified of having a similar experience (infant died from SIDS at 43~4 months old). EDPS score is 21.   The following portions of the patient's history were reviewed and updated as appropriate: allergies, current medications, past family history, past medical history, past social history, past surgical history and problem list.  Review of Systems Pertinent items noted in HPI and remainder of comprehensive ROS otherwise negative.   Objective:   Blood pressure 109/72, pulse 84, height 5\' 2"  (1.575 m), weight 98 lb (44.5 kg), currently breastfeeding. General appearance: alert and no distress Psychologic: normal speech, normal thought content  Assessment:   Postpartum depression Anxiety  Plan:   - Discussion had with patient regarding her postpartum depression screen. Discussed that as she was in the first 2 weeks postnatally, this could be postpartum blues that may subside, however due to her past history, am more concerned that this is likely postpartum depression.  Discussed options of counseling with/without medications. Patient is very hesitant to start a medication at this time as she is fearful of side effects and "not being herself while on pills". Will refer to counseling, and if medications needed at a later time, can initiate.  Offered natural supplements (SAM-E) for mood boosting.   - Anxiety will likely be helped with counseling, however if medications required can initiate.  - F/u in  3 weeks for postpartum visit, will reassess symptoms at that time.    Hildred Laserherry, Wilmer Berryhill, MD [facility]

## 2017-08-25 ENCOUNTER — Encounter: Payer: Medicaid Other | Admitting: Obstetrics and Gynecology

## 2017-08-28 ENCOUNTER — Encounter: Payer: Medicaid Other | Admitting: Obstetrics and Gynecology

## 2017-09-30 ENCOUNTER — Other Ambulatory Visit: Payer: Self-pay

## 2017-09-30 MED ORDER — MELOXICAM 7.5 MG PO TABS: 7.5000 mg | ORAL_TABLET | Freq: Every day | ORAL | 1 refills | Status: DC

## 2018-08-04 ENCOUNTER — Emergency Department
Admission: EM | Admit: 2018-08-04 | Discharge: 2018-08-05 | Disposition: A | Discharge status: Home / Self Care | Payer: Medicaid Other | Attending: Emergency Medicine | Admitting: Emergency Medicine

## 2018-08-04 ENCOUNTER — Encounter: Payer: Self-pay | Admitting: Emergency Medicine

## 2018-08-04 ENCOUNTER — Other Ambulatory Visit: Payer: Self-pay

## 2018-08-04 DIAGNOSIS — F1910 Other psychoactive substance abuse, uncomplicated: Secondary | ICD-10-CM

## 2018-08-04 DIAGNOSIS — F32A Depression, unspecified: Secondary | ICD-10-CM

## 2018-08-04 DIAGNOSIS — F1510 Other stimulant abuse, uncomplicated: Secondary | ICD-10-CM | POA: Insufficient documentation

## 2018-08-04 DIAGNOSIS — Z79899 Other long term (current) drug therapy: Secondary | ICD-10-CM | POA: Insufficient documentation

## 2018-08-04 DIAGNOSIS — F329 Major depressive disorder, single episode, unspecified: Secondary | ICD-10-CM

## 2018-08-04 LAB — CBC WITH DIFFERENTIAL/PLATELET
Abs Immature Granulocytes: 0.1 10*3/uL — ABNORMAL HIGH (ref 0.00–0.07)
Basophils Absolute: 0 10*3/uL (ref 0.0–0.1)
Basophils Relative: 0 %
Eosinophils Absolute: 0.1 10*3/uL (ref 0.0–0.5)
Eosinophils Relative: 1 %
HCT: 41 % (ref 36.0–46.0)
Hemoglobin: 13.7 g/dL (ref 12.0–15.0)
Immature Granulocytes: 1 %
Lymphocytes Relative: 22 %
Lymphs Abs: 1.8 10*3/uL (ref 0.7–4.0)
MCH: 30 pg (ref 26.0–34.0)
MCHC: 33.4 g/dL (ref 30.0–36.0)
MCV: 89.7 fL (ref 80.0–100.0)
Monocytes Absolute: 0.7 10*3/uL (ref 0.1–1.0)
Monocytes Relative: 8 %
Neutro Abs: 5.7 10*3/uL (ref 1.7–7.7)
Neutrophils Relative %: 68 %
Platelets: 313 10*3/uL (ref 150–400)
RBC: 4.57 MIL/uL (ref 3.87–5.11)
RDW: 13.7 % (ref 11.5–15.5)
WBC: 8.4 10*3/uL (ref 4.0–10.5)
nRBC: 0 % (ref 0.0–0.2)

## 2018-08-04 LAB — URINALYSIS, COMPLETE (UACMP) WITH MICROSCOPIC
Bacteria, UA: NONE SEEN
Bilirubin Urine: NEGATIVE
Glucose, UA: NEGATIVE mg/dL
Hgb urine dipstick: NEGATIVE
Ketones, ur: NEGATIVE mg/dL
Nitrite: NEGATIVE
Protein, ur: 30 mg/dL — AB
Specific Gravity, Urine: 1.028 (ref 1.005–1.030)
pH: 6 (ref 5.0–8.0)

## 2018-08-04 LAB — COMPREHENSIVE METABOLIC PANEL
ALT: 16 U/L (ref 0–44)
AST: 22 U/L (ref 15–41)
Albumin: 4.5 g/dL (ref 3.5–5.0)
Alkaline Phosphatase: 54 U/L (ref 38–126)
Anion gap: 9 (ref 5–15)
BUN: 15 mg/dL (ref 6–20)
CO2: 29 mmol/L (ref 22–32)
Calcium: 9.8 mg/dL (ref 8.9–10.3)
Chloride: 102 mmol/L (ref 98–111)
Creatinine, Ser: 0.66 mg/dL (ref 0.44–1.00)
GFR calc Af Amer: >60 mL/min (ref >60)
GFR calc non Af Amer: >60 mL/min (ref >60)
Glucose, Bld: 96 mg/dL (ref 70–99)
Potassium: 3.4 mmol/L — ABNORMAL LOW (ref 3.5–5.1)
Sodium: 140 mmol/L (ref 135–145)
Total Bilirubin: 0.4 mg/dL (ref 0.3–1.2)
Total Protein: 7.4 g/dL (ref 6.5–8.1)

## 2018-08-04 LAB — URINE DRUG SCREEN, QUALITATIVE (ARMC ONLY)
Amphetamines, Ur Screen: POSITIVE — AB
Barbiturates, Ur Screen: NOT DETECTED
Benzodiazepine, Ur Scrn: NOT DETECTED
Cannabinoid 50 Ng, Ur ~~LOC~~: NOT DETECTED
Cocaine Metabolite,Ur ~~LOC~~: NOT DETECTED
MDMA (Ecstasy)Ur Screen: NOT DETECTED
Methadone Scn, Ur: POSITIVE — AB
Opiate, Ur Screen: NOT DETECTED
Phencyclidine (PCP) Ur S: NOT DETECTED
Tricyclic, Ur Screen: NOT DETECTED

## 2018-08-04 LAB — ETHANOL: Alcohol, Ethyl (B): <10 mg/dL (ref <10)

## 2018-08-04 LAB — POCT PREGNANCY, URINE: Preg Test, Ur: NEGATIVE

## 2018-08-04 NOTE — ED Notes (Signed)
poct pregnancy negative. 

## 2018-08-04 NOTE — ED Provider Notes (Signed)
[facility] Emergency Department Provider Note  Time seen: 11:32 PM  I have reviewed the triage vital signs and the nursing notes.   HISTORY  Chief Complaint Drug Problem   HPI Jill Shepherd is a 23 y.o. 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  According to triage report patient was requesting detox from methamphetamine.  The patient was seen by myself which is approximately 4 hours after she initially checked in, and she is very somnolent, will awaken to voice, states she wants to go home and then will fall back asleep.  I asked specifically if she wants to detox or go home patient states she wants to go home.  Unable to obtain any additional history at this time review of systems secondary to patient cooperation.  Past Medical History:  Diagnosis Date  . Headache   . SAB (spontaneous abortion) 01/19/2014   stillborn at 20.4 wks    Patient Active Problem List   Diagnosis Date Noted  . Anxiety 08/04/2017  . Postpartum depression 08/04/2017  . Labor and delivery indication for care or intervention 07/17/2017  . SVD (spontaneous vaginal delivery) 07/17/2017  . Pregnancy 07/07/2017  . History of preterm delivery 11/28/2016  . Pregnancy complicated by subutex maintenance, antepartum (Between) 11/28/2016  . History of preterm premature rupture of membranes (PPROM) 09/25/2015  . History of pregnancy loss in prior pregnancy, currently pregnant 05/14/2015  . History of drug use 05/14/2015  . Underweight 05/14/2015    No past surgical history on file.  Prior to Admission medications   Medication Sig Start Date End Date Taking? Authorizing Provider  buprenorphine (SUBUTEX) 8 MG SUBL SL tablet Place 8 mg under the tongue daily.     [provider]  ibuprofen (ADVIL,MOTRIN) 800 MG tablet Take 1 tablet (800 mg total) by mouth every 8 (eight) hours as needed. 07/19/17    Rubie Maid, MD  meloxicam (MOBIC) 7.5 MG tablet Take 1 tablet (7.5 mg total) by mouth daily. 09/30/17   Rubie Maid, MD  Prenatal Vit-Fe Fumarate-FA (PRENATAL MULTIVITAMIN) TABS tablet Take 1 tablet by mouth daily at 12 noon.    [provider]    No Known Allergies  Family History  Problem Relation Age of Onset  . Hypertension Father   . Hypertension Paternal Grandfather     Social History Social History   Tobacco Use  . Smoking status: Never Smoker  . Smokeless tobacco: Never Used  Substance Use Topics  . Alcohol use: No    Alcohol/week: 0.0 standard drinks  . Drug use: Yes    Types: Other-see comments    Comment: subutex tx    Review of Systems Unable to obtain adequate/accurate review of systems secondary to lack of patient cooperation.  ____________________________________________   PHYSICAL EXAM:  VITAL SIGNS: ED Triage Vitals  Enc Vitals Group     BP 08/04/18 1931 115/74     Pulse Rate 08/04/18 1931 (!) 103     Resp 08/04/18 1931 20     Temp 08/04/18 1931 98.8 F (37.1 C)     Temp Source 08/04/18 1931 Oral     SpO2 08/04/18 1931 100 %     Weight 08/04/18 1931 95 lb (43.1 kg)     Height 08/04/18 1931 5\' 1"  (1.549 m)     Head Circumference --      Peak Flow --      Pain Score 08/04/18  1939 0     Pain Loc --      Pain Edu? --      Excl. in GC? --    Constitutional: Patient is somnolent but awakens to voice or light physical stimuli, will briefly answer questions and then will go back to sleep refusing to answer additional questions. Eyes: Normal exam ENT      Head: Normocephalic and atraumatic.      Mouth/Throat: Mucous membranes are moist. Cardiovascular: Normal rate, regular rhythm.  Respiratory: Normal respiratory effort without tachypnea nor retractions. Breath sounds are clear Gastrointestinal: Soft and nontender. No distention.   Musculoskeletal: Nontender with normal range of motion in all extremities Neurologic:  Normal speech  and language.  Moves all extremities. Skin:  Skin is warm, dry  Psychiatric: Somnolent, but does awaken to voice or light physical stimuli    INITIAL IMPRESSION / ASSESSMENT AND PLAN / ED COURSE  Pertinent labs & imaging results that were available during my care of the patient were reviewed by me and considered in my medical decision making (see chart for details).   Patient presents to the emergency department reportedly for methamphetamine detox.  However by the time I see the patient approximately 4 hours later she is somnolent, states she wants to go home.  Will awaken to loud voice or light physical stimuli briefly answer questions and then will go back to sleep refusing to answer any other questions.  When asked specifically she wants detox and wants to go home patient states she wants to go home.  Patient's medical work-up is been largely nonrevealing besides urine toxicology positive for methamphetamines.  If the patient can find a ride home we will discharge the patient home.  Anabel HalonBrandy D Soth was evaluated in Emergency Department on 08/04/2018 for the symptoms described in the history of present illness. She was evaluated in the context of the global COVID-19 pandemic, which necessitated consideration that the patient might be at risk for infection with the SARS-CoV-2 virus that causes COVID-19. Institutional protocols and algorithms that pertain to the evaluation of patients at risk for COVID-19 are in a state of rapid change based on information released by regulatory bodies including the CDC and federal and state organizations. These policies and algorithms were followed during the patient's care in the ED.  ____________________________________________   FINAL CLINICAL IMPRESSION(S) / ED DIAGNOSES  Substance abuse   Minna AntisPaduchowski, Lynnsey Barbara, MD 08/04/18 2336

## 2018-08-04 NOTE — ED Triage Notes (Signed)
Pt requesting detox from meth.  Spouse also checked in with similar sx.  Pt last used today.  Denies SI or HI.  Pt calm

## 2018-08-05 NOTE — ED Notes (Signed)
Pt. Given out-patient resources on detox facilities upon discharge.

## 2018-08-05 NOTE — ED Notes (Signed)
Pt. Going home with husband/boyfriend.

## 2018-08-05 NOTE — ED Notes (Signed)
Attempts to talk to patient about options for drug treatment have so far been unsuccessful.   Pt. Will wake up but will not answer questions on treatment.

## 2018-08-12 ENCOUNTER — Emergency Department
Admission: EM | Admit: 2018-08-12 | Discharge: 2018-08-13 | Disposition: A | Discharge status: Home / Self Care | Payer: Self-pay | Attending: Emergency Medicine | Admitting: Emergency Medicine

## 2018-08-12 ENCOUNTER — Encounter: Payer: Self-pay | Admitting: Emergency Medicine

## 2018-08-12 DIAGNOSIS — F1524 Other stimulant dependence with stimulant-induced mood disorder: Secondary | ICD-10-CM | POA: Insufficient documentation

## 2018-08-12 DIAGNOSIS — T401X1A Poisoning by heroin, accidental (unintentional), initial encounter: Secondary | ICD-10-CM | POA: Insufficient documentation

## 2018-08-12 DIAGNOSIS — F1320 Sedative, hypnotic or anxiolytic dependence, uncomplicated: Secondary | ICD-10-CM | POA: Insufficient documentation

## 2018-08-12 DIAGNOSIS — Z79899 Other long term (current) drug therapy: Secondary | ICD-10-CM | POA: Insufficient documentation

## 2018-08-12 DIAGNOSIS — Z20828 Contact with and (suspected) exposure to other viral communicable diseases: Secondary | ICD-10-CM | POA: Insufficient documentation

## 2018-08-12 LAB — COMPREHENSIVE METABOLIC PANEL
ALT: 34 U/L (ref 0–44)
AST: 49 U/L — ABNORMAL HIGH (ref 15–41)
Albumin: 4.2 g/dL (ref 3.5–5.0)
Alkaline Phosphatase: 77 U/L (ref 38–126)
Anion gap: 10 (ref 5–15)
BUN: 18 mg/dL (ref 6–20)
CO2: 28 mmol/L (ref 22–32)
Calcium: 9.4 mg/dL (ref 8.9–10.3)
Chloride: 102 mmol/L (ref 98–111)
Creatinine, Ser: 0.83 mg/dL (ref 0.44–1.00)
GFR calc Af Amer: >60 mL/min (ref >60)
GFR calc non Af Amer: >60 mL/min (ref >60)
Glucose, Bld: 116 mg/dL — ABNORMAL HIGH (ref 70–99)
Potassium: 3.6 mmol/L (ref 3.5–5.1)
Sodium: 140 mmol/L (ref 135–145)
Total Bilirubin: 0.6 mg/dL (ref 0.3–1.2)
Total Protein: 7.4 g/dL (ref 6.5–8.1)

## 2018-08-12 LAB — CBC
HCT: 38.9 % (ref 36.0–46.0)
Hemoglobin: 13.2 g/dL (ref 12.0–15.0)
MCH: 29.7 pg (ref 26.0–34.0)
MCHC: 33.9 g/dL (ref 30.0–36.0)
MCV: 87.6 fL (ref 80.0–100.0)
Platelets: 236 10*3/uL (ref 150–400)
RBC: 4.44 MIL/uL (ref 3.87–5.11)
RDW: 13.6 % (ref 11.5–15.5)
WBC: 6.3 10*3/uL (ref 4.0–10.5)
nRBC: 0 % (ref 0.0–0.2)

## 2018-08-12 LAB — URINE DRUG SCREEN, QUALITATIVE (ARMC ONLY)
Amphetamines, Ur Screen: POSITIVE — AB
Barbiturates, Ur Screen: NOT DETECTED
Benzodiazepine, Ur Scrn: NOT DETECTED
Cannabinoid 50 Ng, Ur ~~LOC~~: NOT DETECTED
Cocaine Metabolite,Ur ~~LOC~~: NOT DETECTED
MDMA (Ecstasy)Ur Screen: NOT DETECTED
Methadone Scn, Ur: NOT DETECTED
Opiate, Ur Screen: POSITIVE — AB
Phencyclidine (PCP) Ur S: NOT DETECTED
Tricyclic, Ur Screen: NOT DETECTED

## 2018-08-12 LAB — POCT PREGNANCY, URINE: Preg Test, Ur: NEGATIVE

## 2018-08-12 NOTE — ED Triage Notes (Signed)
Pt arrived via EMS from friends house where pt went unconscious post heroin and meth usage. Pt was provided 2 Narcan via Psychologist, occupational. As well as 2 minutes of bagging for ventilation. Pt became alert and responsive post interventions. Pt is A&O x4 on arrival to ED. Pt denies HI and SI.

## 2018-08-12 NOTE — ED Provider Notes (Signed)
[facility] Emergency Department Provider Note ____   First MD Initiated Contact with Patient 08/12/18 2316     (approximate)  I have reviewed the triage vital signs and the nursing notes.   HISTORY  Chief Complaint Drug Overdose   HPI Jill Shepherd is a 23 y.o. female with below list of previous medical conditions including polysubstance abuse heroin and methamphetamine.  Patient overdosed tonight on heroin "accidental".  Patient was given 2 mg of Narcan by fire department and required Ambu bag ventilation for approximately 2 minutes before she became responsive.  Patient denies any complaints at present.        Past Medical History:  Diagnosis Date  . Headache   . SAB (spontaneous abortion) 01/19/2014   stillborn at 20.4 wks    Patient Active Problem List   Diagnosis Date Noted  . Anxiety 08/04/2017  . Postpartum depression 08/04/2017  . Labor and delivery indication for care or intervention 07/17/2017  . SVD (spontaneous vaginal delivery) 07/17/2017  . Pregnancy 07/07/2017  . History of preterm delivery 11/28/2016  . Pregnancy complicated by subutex maintenance, antepartum ([place]) 11/28/2016  . History of preterm premature rupture of membranes (PPROM) 09/25/2015  . History of pregnancy loss in prior pregnancy, currently pregnant 05/14/2015  . History of drug use 05/14/2015  . Underweight 05/14/2015    History reviewed. No pertinent surgical history.  Prior to Admission medications   Medication Sig Start Date End Date Taking? Authorizing Provider  buprenorphine (SUBUTEX) 8 MG SUBL SL tablet Place 8 mg under the tongue daily.     [provider]  ibuprofen (ADVIL,MOTRIN) 800 MG tablet Take 1 tablet (800 mg total) by mouth every 8 (eight) hours as needed. 07/19/17   Rubie Maid, MD  meloxicam (MOBIC) 7.5 MG tablet Take 1 tablet (7.5 mg total) by mouth daily. 09/30/17   Rubie Maid, MD  Prenatal Vit-Fe Fumarate-FA (PRENATAL  MULTIVITAMIN) TABS tablet Take 1 tablet by mouth daily at 12 noon.    [provider]    Allergies Patient has no known allergies.  Family History  Problem Relation Age of Onset  . Hypertension Father   . Hypertension Paternal Grandfather     Social History Social History   Tobacco Use  . Smoking status: Never Smoker  . Smokeless tobacco: Never Used  Substance Use Topics  . Alcohol use: No    Alcohol/week: 0.0 standard drinks  . Drug use: Yes    Types: Other-see comments    Comment: subutex tx    Review of Systems Constitutional: No fever/chills Eyes: No visual changes. ENT: No sore throat. Cardiovascular: Denies chest pain. Respiratory: Denies shortness of breath. Gastrointestinal: No abdominal pain.  No nausea, no vomiting.  No diarrhea.  No constipation. Genitourinary: Negative for dysuria. Musculoskeletal: Negative for neck pain.  Negative for back pain. Integumentary: Negative for rash. Neurological: Negative for headaches, focal weakness or numbness. Psychiatric:  Positive for accidental overdose   ____________________________________________   PHYSICAL EXAM:  VITAL SIGNS: ED Triage Vitals  Enc Vitals Group     BP 08/12/18 2258 115/80     Pulse Rate 08/12/18 2258 96     Resp 08/12/18 2258 17     Temp 08/12/18 2258 97.8 F (36.6 C)     Temp Source 08/12/18 2258 Oral     SpO2 08/12/18 2258 100 %     Weight 08/12/18 2256 43.1 kg (95 lb)     Height 08/12/18 2256 1.549 m (5\' 1" )  Head Circumference --      Peak Flow --      Pain Score --      Pain Loc --      Pain Edu? --      Excl. in GC? --     Constitutional: Alert and oriented. Well appearing and in no acute distress. Eyes: Conjunctivae are normal.  Head: Atraumatic. Mouth/Throat: Mucous membranes are moist.  Oropharynx non-erythematous. Neck: No stridor.  Cardiovascular: Normal rate, regular rhythm. Good peripheral circulation. Grossly normal heart sounds. Respiratory: Normal  respiratory effort.  No retractions. No audible wheezing. Gastrointestinal: Soft and nontender. No distention.  Musculoskeletal: No lower extremity tenderness nor edema. No gross deformities of extremities. Neurologic:  Normal speech and language. No gross focal neurologic deficits are appreciated.  Skin:  Skin is warm, dry and intact. No rash noted. Psychiatric: Mood and affect are normal. Speech and behavior are normal.  ____________________________________________   LABS (all labs ordered are listed, but only abnormal results are displayed)  Labs Reviewed  COMPREHENSIVE METABOLIC PANEL - Abnormal; Notable for the following components:      Result Value   Glucose, Bld 116 (*)    AST 49 (*)    All other components within normal limits  URINE DRUG SCREEN, QUALITATIVE ([facility] ONLY) - Abnormal; Notable for the following components:   Amphetamines, Ur Screen POSITIVE (*)    Opiate, Ur Screen POSITIVE (*)    All other components within normal limits  ETHANOL  SALICYLATE LEVEL  ACETAMINOPHEN LEVEL  CBC  POCT PREGNANCY, URINE  POC URINE PREG, ED   ____________________________________________  EKG  ED ECG REPORT I, [place] N BROWN, the attending physician, personally viewed and interpreted this ECG.   Date: 08/13/2018  EKG Time: 10:56PM  Rate: 97  Rhythm: Normal sinus rhythm  Axis: Normal  Intervals: Normal   ST&T Change: None   Procedures   ____________________________________________   INITIAL IMPRESSION / MDM / ASSESSMENT AND PLAN / ED COURSE  As part of my medical decision making, I reviewed the following data within the electronic MEDICAL RECORD NUMBER 23 year old female presented with above-stated history and physical exam secondary to accidental heroin overdose.  I spoke with the patient for quite some time and offered detox to which she is agreeable.  Patient evaluated by TTS staff with plan for detox admission.  ____________________________________________   FINAL CLINICAL IMPRESSION(S) / ED DIAGNOSES  Final diagnoses:  Accidental overdose of heroin, initial encounter (HCC)     MEDICATIONS GIVEN DURING THIS VISIT:  Medications - No data to display   ED Discharge Orders    None      *Please note:  Jill Shepherd was evaluated in Emergency Department on 08/13/2018 for the symptoms described in the history of present illness. She was evaluated in the context of the global COVID-19 pandemic, which necessitated consideration that the patient might be at risk for infection with the SARS-CoV-2 virus that causes COVID-19. Institutional protocols and algorithms that pertain to the evaluation of patients at risk for COVID-19 are in a state of rapid change based on information released by regulatory bodies including the CDC and federal and state organizations. These policies and algorithms were followed during the patient's care in the ED.  Some ED evaluations and interventions may be delayed as a result of limited staffing during the pandemic.*  Note:  This document was prepared using Dragon voice recognition software and may include unintentional dictation errors.   Darci CurrentBrown, [place] N, MD 08/13/18 [phone]

## 2018-08-13 LAB — SALICYLATE LEVEL: Salicylate Lvl: <7 mg/dL (ref 2.8–30.0)

## 2018-08-13 LAB — ETHANOL: Alcohol, Ethyl (B): <10 mg/dL (ref <10)

## 2018-08-13 LAB — ACETAMINOPHEN LEVEL: Acetaminophen (Tylenol), Serum: 23 ug/mL (ref 10–30)

## 2018-08-13 LAB — SARS CORONAVIRUS 2 BY RT PCR (HOSPITAL ORDER, PERFORMED IN ~~LOC~~ HOSPITAL LAB): SARS Coronavirus 2: NEGATIVE

## 2018-08-13 NOTE — BH Assessment (Signed)
Pt gave clinician verbal consent to contact her step-mother, Thea Holshouser, to inform her that she is in the hospital, which hospital she is at, and that she overdosed. Pt provided clinician her cell phone password to retrieve her step-mother's phone number. The police had already left with pt's cell phone, so clinician utilized pt's emergency contact information in pt's chart to attempt to contact pt's step-mother. Clinician called pt's mother at 19 and at 52 but there was no answer either time and the message said the 'phone call could not be completed at this time.' Thus, clinician was unable to leave a HIPAA-compliant message for pt's step-mother.  Clelia Croft, step-mother: [phone]

## 2018-08-13 NOTE — ED Provider Notes (Signed)
Patient did have bed arranged for her at [facility] in [place]. Will discharge.    Nance Pear, MD 08/13/18 [phone]

## 2018-08-13 NOTE — ED Notes (Signed)
Spoke to [place] who confirmed services with [facility]. PT states that she has transportation to [facility]

## 2018-08-13 NOTE — BH Assessment (Signed)
Received phone call from [name]), requesting COVID-19 test. Writer updated ER MD (Dr. Quentin Cornwall).

## 2018-08-13 NOTE — ED Notes (Signed)
This RN spoke to Tax adviser from [place] who states that when he gets back to station he will access pt's phone and obtain pt's mothers number and call back

## 2018-08-13 NOTE — BH Assessment (Addendum)
Writer spoke with patient to complete updated/reassessment. Patient admits to the use of methamphetamine & heroin. She voiced desire to get into a SA treatment facility. Patient denies SI/HI and AV/H.

## 2018-08-13 NOTE — Discharge Instructions (Addendum)
Please seek medical attention and help for any thoughts about wanting to harm yourself, harm others, any concerning change in behavior, severe depression, inappropriate drug use or any other new or concerning symptoms. ° °

## 2018-08-13 NOTE — ED Notes (Signed)
PT given phone and phone numbers to PD to attempt to locate her phone. PT does not have ride to [facility], but when offered transportation pt states she will not go to [facility] until she speaks to her mother.

## 2018-08-13 NOTE — BH Assessment (Signed)
Tele Assessment Note   Patient Name: Jill Shepherd MRN: [id number] Referring Physician: Dr. Marjean Donna, MD Location of Patient: [facility] ED Location of Provider: [place] Department  Jill Shepherd is a 23 y.o. female who was brought to [facility] ED due to a heroin and methamphetamine o/d. As reported by nursing notes, pt required two Narcan to regain consciousness and 2 minutes of bagging for ventilation from EMTs/the fire department. Pt denies SI, a history of SI, a hx of depression, hospital admissions, attempts to kill herself, seeing a therapist, or seeing a psychiatrist. Pt also denies HI, AVH, NSSIB, access to guns/weapons, or involvement in the legal system.  Pt is unable to recall how much heroin she is currently using. She states she is currently using 2-3 grams of methamphetamine daily.  Pt gave permission for clinician to contact her step-mother and provide her step-mother information regarding her currently being hospitalized and about her overdose. Clinician attempted to use the number in pt's chart but no one answered at the listed number and it stated 'this call cannot be completed.'  MSE was UTA. Pt's recent and remote memory was UTA. Pt was cooperative during the assessment process. Pt's insight, judgement, and impulse control is poor at this time.   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 Headache   . SAB (spontaneous abortion) 01/19/2014   stillborn at 20.4 wks    History reviewed. No pertinent surgical history.  Family History:  Family History  Problem Relation Age of Onset  . Hypertension Father   . Hypertension Paternal Grandfather     Social History:  reports that she has never smoked. She has never used smokeless tobacco. She reports current drug use. Drug:  Other-see comments. She reports that she does not drink alcohol.  Additional Social History:  Alcohol / Drug Use Pain Medications: Please see MAR Prescriptions: Please see MAR Over the Counter: Please see MAR History of alcohol / drug use?: Yes Longest period of sobriety (when/how long): Unknown Substance #1 Name of Substance 1: Methamphetamine 1 - Age of First Use: Unknown 1 - Amount (size/oz): 2-3 grams 1 - Frequency: Daily 1 - Duration: Unknown 1 - Last Use / Amount: 08/12/2018 Substance #2 Name of Substance 2: Heroin 2 - Age of First Use: Unknown 2 - Amount (size/oz): Unknown 2 - Frequency: Unknown 2 - Duration: Unknown 2 - Last Use / Amount: 08/12/2018  CIWA: CIWA-Ar BP: 105/74 Pulse Rate: 63 COWS: Clinical Opiate Withdrawal Scale (COWS) Resting Pulse Rate: Pulse Rate 81-100 Sweating: Subjective report of chills or flushing Restlessness: Able to sit still Pupil Size: Pupils pinned or normal size for room light Bone or Joint Aches: Mild diffuse discomfort(Around neck) Runny Nose or Tearing: Not present GI Upset: No GI symptoms Tremor: No tremor Yawning: No yawning Anxiety or Irritability: None Gooseflesh Skin: Skin is smooth COWS Total Score: 3  Allergies: No Known Allergies  Home Medications: (Not in a hospital admission)   OB/GYN Status:  No LMP recorded. (Menstrual status: Irregular Periods).  General Assessment Data Assessment unable to be completed: Yes Reason for not completing assessment: Pt was unable to stay awake to complete the assessment Location of Assessment: [facility] ED TTS Assessment: In system Is this a Tele or Face-to-Face Assessment?: Face-to-Face Is this an Initial Assessment or a Re-assessment for this encounter?: Initial Assessment  Patient Accompanied by:: N/A Language Other than English: No Living Arrangements: Other (Comment)(Pt is currently living with friends, was living w/ grandma) What gender do you identify as?: Female Marital  status: Single Maiden name: Marrocco Pregnancy Status: No Living Arrangements: Non-relatives/Friends Can pt return to current living arrangement?: Yes Admission Status: Voluntary Is patient capable of signing voluntary admission?: Yes Referral Source: MD Insurance type: None     Crisis Care Plan Living Arrangements: Non-relatives/Friends Legal Guardian: Other:(Self) Name of Psychiatrist: None Name of Therapist: None  Education Status Is patient currently in school?: No Is the patient employed, unemployed or receiving disability?: Unemployed  Risk to self with the past 6 months Suicidal Ideation: No Has patient been a risk to self within the past 6 months prior to admission? : No Suicidal Intent: No Has patient had any suicidal intent within the past 6 months prior to admission? : No Is patient at risk for suicide?: No Suicidal Plan?: No Has patient had any suicidal plan within the past 6 months prior to admission? : No Access to Means: No What has been your use of drugs/alcohol within the last 12 months?: Pt acknowledges heroin and methamphetamine use Previous Attempts/Gestures: No How many times?: 0 Other Self Harm Risks: Pt o/d on methamphetamine and heroin Triggers for Past Attempts: None known Intentional Self Injurious Behavior: None Family Suicide History: Unable to assess Recent stressful life event(s): Other (Comment)(SA overdose) Persecutory voices/beliefs?: No Depression: No(Pt denies) Depression Symptoms: Despondent, Feeling worthless/self pity Substance abuse history and/or treatment for substance abuse?: Yes Suicide prevention information given to non-admitted patients: Not applicable  Risk to Others within the past 6 months Homicidal Ideation: No Does patient have any lifetime risk of violence toward others beyond the six months prior to admission? : No Thoughts of Harm to Others: No Current Homicidal Intent: No Current Homicidal Plan: No Access to  Homicidal Means: No Identified Victim: None noted History of harm to others?: No Assessment of Violence: On admission Violent Behavior Description: None noted Does patient have access to weapons?: No(Pt denies access to guns/weapons) Criminal Charges Pending?: No Does patient have a court date: No Is patient on probation?: No  Psychosis Hallucinations: None noted Delusions: None noted  Mental Status Report Appearance/Hygiene: Disheveled, In hospital gown, Other (Comment)(Pt has scratches on her face) Eye Contact: Poor Motor Activity: Freedom of movement Speech: Incoherent, Slow, Soft Level of Consciousness: Drowsy Mood: Other (Comment)(Pt is still drowsy from her overdose) Affect: Appropriate to circumstance Anxiety Level: (UTA) Thought Processes: Unable to Assess Judgement: Impaired Orientation: Unable to assess Obsessive Compulsive Thoughts/Behaviors: Unable to Assess  Cognitive Functioning Concentration: Unable to Assess Memory: Unable to Assess Is patient IDD: No Insight: Poor Impulse Control: Poor Appetite: (UTA) Have you had any weight changes? : (UTA) Sleep: Unable to Assess Total Hours of Sleep: (UTA) Vegetative Symptoms: Unable to Assess  ADLScreening [facility] Assessment Services) Patient's cognitive ability adequate to safely complete daily activities?: Yes Patient able to express need for assistance with ADLs?: Yes Independently performs ADLs?: Yes (appropriate for developmental age)  Prior Inpatient Therapy Prior Inpatient Therapy: No  Prior Outpatient Therapy Prior Outpatient Therapy: No Does patient have an ACCT team?: No Does patient have Intensive In-House Services?  : No Does patient have [facility] services? : No Does patient have P4CC services?: No  ADL Screening (condition at time of admission) Patient's cognitive ability adequate to safely complete daily activities?: Yes Is the patient deaf or have difficulty hearing?: No Does the patient have  difficulty seeing,  even when wearing glasses/contacts?: No Does the patient have difficulty concentrating, remembering, or making decisions?: Yes Patient able to express need for assistance with ADLs?: Yes Does the patient have difficulty dressing or bathing?: No Independently performs ADLs?: Yes (appropriate for developmental age) Does the patient have difficulty walking or climbing stairs?: No Weakness of Legs: None Weakness of Arms/Hands: None  Home Assistive Devices/Equipment Home Assistive Devices/Equipment: None  Therapy Consults (therapy consults require a physician order) PT Evaluation Needed: No OT Evalulation Needed: No SLP Evaluation Needed: No Abuse/Neglect Assessment (Assessment to be complete while patient is alone) Abuse/Neglect Assessment Can Be Completed: Unable to assess, patient is non-responsive or altered mental status Values / Beliefs Cultural Requests During Hospitalization: None Spiritual Requests During Hospitalization: None Consults Spiritual Care Consult Needed: No Social Work Consult Needed: No Merchant navy officerAdvance Directives (For Healthcare) Does Patient Have a Medical Advance Directive?: Unable to assess, patient is non-responsive or altered mental status        Disposition: Awaiting disposition   Disposition Initial Assessment Completed for this Encounter: Yes  This service was provided via telemedicine using a 2-way, interactive audio and video technology.  Names of all persons participating in this telemedicine service and their role in this encounter. Name: Jill FasterBrandy State Role: Patient  Name: Dr. Bayard Malesandolph Brown Role: EDP  Name: Duard BradySamantha Maverick Patman Role: Clinician    Ralph DowdySamantha L Maurica Shepherd 08/13/2018 6:39 AM

## 2018-08-13 NOTE — BH Assessment (Signed)
Clinician attempted to complete the [facility] Assessment with pt; however, pt was unable to stay awake or answer questions with the exception of one-word answers. Clinician will attempt again at a later time.

## 2018-08-13 NOTE — BH Assessment (Signed)
Referral information faxed to [facility] [phone]).

## 2018-08-13 NOTE — ED Notes (Signed)
PT up to restroom with steady gate. PT is AOx4

## 2018-08-13 NOTE — ED Notes (Signed)
Report received, care of pt assumed.  Pt resting quietly on ER stretcher, eyes closed.  NAD noted, VSS.  Will continue to monitor.

## 2018-08-13 NOTE — BH Assessment (Signed)
Writer provided patient with contact information for [facility]. She stated she had made plans to go them in the past. She reports of having transportation to get there. Patient continue to denies SI/HI and AV/H.

## 2018-08-13 NOTE — ED Notes (Signed)
PT provided [place] sandwich tray and cola.

## 2018-12-06 ENCOUNTER — Emergency Department
Admission: EM | Admit: 2018-12-06 | Discharge: 2018-12-06 | Disposition: A | Discharge status: Home / Self Care | Payer: Self-pay | Attending: Student in an Organized Health Care Education/Training Program | Admitting: Student in an Organized Health Care Education/Training Program

## 2018-12-06 ENCOUNTER — Other Ambulatory Visit: Payer: Self-pay

## 2018-12-06 DIAGNOSIS — T401X1A Poisoning by heroin, accidental (unintentional), initial encounter: Secondary | ICD-10-CM | POA: Insufficient documentation

## 2018-12-06 DIAGNOSIS — Z79899 Other long term (current) drug therapy: Secondary | ICD-10-CM | POA: Insufficient documentation

## 2018-12-06 MED ORDER — SODIUM CHLORIDE 0.9 % IV BOLUS
500.0000 mL | Freq: Once | INTRAVENOUS | Status: AC
  Administered 2018-12-06: 21:00:00 500 mL via INTRAVENOUS

## 2018-12-06 NOTE — ED Notes (Signed)
Pt requesting food, per pt it has been a couple of days since she last ate. Per Dr. Quentin Cornwall its ok to give meal tray. Pt given [place] sandwich tray and ginger ale.

## 2018-12-06 NOTE — ED Triage Notes (Signed)
Pt comes EMS from home with heroin OD. Pt was unresponsive upon arrival then 4mg  narcan and pt now AOX4. 18G started by EMS. Pt took drugs recreationally.

## 2018-12-06 NOTE — ED Notes (Signed)
No answer from mother and pt does not know any other numbers.

## 2018-12-06 NOTE — ED Provider Notes (Signed)
[facility] Emergency Department Provider Note    First MD Initiated Contact with Patient 12/06/18 2012     (approximate)  I have reviewed the triage vital signs and the nursing notes.   HISTORY  Chief Complaint Drug Overdose    HPI Jill Shepherd is a 23 y.o. female with a history of heroin dependence presents to the ER after accidental overdose.  Patient states that she was using last time before she was about to check her self into rehab program.  EMS arrived and found the patient unresponsive.  Was given Narcan with good results.  Patient arrives ER protecting her airway.  Denies any SI or HI.  No intent for self-harm.  Is a long history of substance abuse issues she says.  Past Medical History:  Diagnosis Date  . Headache   . SAB (spontaneous abortion) 01/19/2014   stillborn at 20.4 wks   Family History  Problem Relation Age of Onset  . Hypertension Father   . Hypertension Paternal Grandfather    No past surgical history on file. Patient Active Problem List   Diagnosis Date Noted  . Anxiety 08/04/2017  . Postpartum depression 08/04/2017  . Labor and delivery indication for care or intervention 07/17/2017  . SVD (spontaneous vaginal delivery) 07/17/2017  . Pregnancy 07/07/2017  . History of preterm delivery 11/28/2016  . Pregnancy complicated by subutex maintenance, antepartum ([place]) 11/28/2016  . History of preterm premature rupture of membranes (PPROM) 09/25/2015  . History of pregnancy loss in prior pregnancy, currently pregnant 05/14/2015  . History of drug use 05/14/2015  . Underweight 05/14/2015      Prior to Admission medications   Medication Sig Start Date End Date Taking? Authorizing Provider  buprenorphine (SUBUTEX) 8 MG SUBL SL tablet Place 8 mg under the tongue daily.     [provider]  Prenatal Vit-Fe Fumarate-FA (PRENATAL MULTIVITAMIN) TABS tablet Take 1 tablet by mouth daily at 12 noon.    [provider]    Allergies Patient has no known allergies.    Social History Social History   Tobacco Use  . Smoking status: Never Smoker  . Smokeless tobacco: Never Used  Substance Use Topics  . Alcohol use: No    Alcohol/week: 0.0 standard drinks  . Drug use: Yes    Types: Other-see comments    Comment: subutex tx    Review of Systems Patient denies headaches, rhinorrhea, blurry vision, numbness, shortness of breath, chest pain, edema, cough, abdominal pain, nausea, vomiting, diarrhea, dysuria, fevers, rashes or hallucinations unless otherwise stated above in HPI. ____________________________________________   PHYSICAL EXAM:  VITAL SIGNS: Vitals:   12/06/18 2004 12/06/18 2148  BP: 106/83 118/73  Pulse: (!) 102 (!) 102  Resp: 18 17  Temp: 98 F (36.7 C)   SpO2: 100% 98%    Constitutional: Alert and oriented.  Eyes: Conjunctivae are normal.  Head: Atraumatic. Nose: No congestion/rhinnorhea. Mouth/Throat: Mucous membranes are moist.   Neck: No stridor. Painless ROM.  Cardiovascular: Normal rate, regular rhythm. Grossly normal heart sounds.  Good peripheral circulation. Respiratory: Normal respiratory effort.  No retractions. Lungs CTAB. Gastrointestinal: Soft and nontender. No distention. No abdominal bruits. No CVA tenderness. Genitourinary:  Musculoskeletal: No lower extremity tenderness nor edema.  No joint effusions. Neurologic:  Normal speech and language. No gross focal neurologic deficits are appreciated. No facial droop Skin:  Skin is warm, dry and intact. No rash noted. Psychiatric: Mood and affect are normal. Speech and behavior are normal.  ____________________________________________   LABS (all labs ordered are listed, but only abnormal results are displayed)  No results found for this or any previous visit (from the past 24 hour(s)). ____________________________________________  EKG My review and personal interpretation at Time: 20:02   Indication:  OD  Rate: 99  Rhythm: sinus Axis: normal Other: normal intervals, no stemi ____________________________________________  RADIOLOGY  I personally reviewed all radiographic images ordered to evaluate for the above acute complaints and reviewed radiology reports and findings.  These findings were personally discussed with the patient.  Please see medical record for radiology report.  ____________________________________________   PROCEDURES  Procedure(s) performed:  Procedures    Critical Care performed: no ____________________________________________   INITIAL IMPRESSION / ASSESSMENT AND PLAN / ED COURSE  Pertinent labs & imaging results that were available during my care of the patient were reviewed by me and considered in my medical decision making (see chart for details).   DDX: overdose, intentional ov, recreational drug use  Jill Shepherd is a 23 y.o. who presents to the ED with accidental overdose on heroin.  Patient has a long history of recreational heroin use.  She currently protecting her airway appears well after receiving Narcan.  Will observe in the ER.  No indication for IVC.  Clinical Course as of Dec 05 2229  Mon Dec 06, 2018  2109 And observed in the ER.  She is not having any evidence of rebound she is tolerating p.o.  Again denies any SI or HI.  Has plans for outpatient rehab.   [PR]    Clinical Course User Index [PR] Willy Eddy, MD    The patient was evaluated in Emergency Department today for the symptoms described in the history of present illness. He/she was evaluated in the context of the global COVID-19 pandemic, which necessitated consideration that the patient might be at risk for infection with the SARS-CoV-2 virus that causes COVID-19. Institutional protocols and algorithms that pertain to the evaluation of patients at risk for COVID-19 are in a state of rapid change based on information released by regulatory bodies including the CDC and  federal and state organizations. These policies and algorithms were followed during the patient's care in the ED.  As part of my medical decision making, I reviewed the following data within the electronic MEDICAL RECORD NUMBER Nursing notes reviewed and incorporated, Labs reviewed, notes from prior ED visits and [place] Controlled Substance Database   ____________________________________________   FINAL CLINICAL IMPRESSION(S) / ED DIAGNOSES  Final diagnoses:  Accidental overdose of heroin, initial encounter [facility])      NEW MEDICATIONS STARTED DURING THIS VISIT:  Discharge Medication List as of 12/06/2018  9:33 PM       Note:  This document was prepared using Dragon voice recognition software and may include unintentional dictation errors.    Willy Eddy, MD 12/06/18 2231

## 2019-03-26 ENCOUNTER — Emergency Department: Payer: Self-pay

## 2019-03-26 ENCOUNTER — Emergency Department
Admission: EM | Admit: 2019-03-26 | Discharge: 2019-03-27 | Disposition: A | Discharge status: Home / Self Care | Payer: Self-pay | Attending: Emergency Medicine | Admitting: Emergency Medicine

## 2019-03-26 ENCOUNTER — Other Ambulatory Visit: Payer: Self-pay

## 2019-03-26 DIAGNOSIS — Z79899 Other long term (current) drug therapy: Secondary | ICD-10-CM | POA: Insufficient documentation

## 2019-03-26 DIAGNOSIS — R000 Tachycardia, unspecified: Secondary | ICD-10-CM | POA: Insufficient documentation

## 2019-03-26 DIAGNOSIS — R509 Fever, unspecified: Secondary | ICD-10-CM | POA: Insufficient documentation

## 2019-03-26 DIAGNOSIS — R451 Restlessness and agitation: Secondary | ICD-10-CM | POA: Insufficient documentation

## 2019-03-26 DIAGNOSIS — Z046 Encounter for general psychiatric examination, requested by authority: Secondary | ICD-10-CM | POA: Insufficient documentation

## 2019-03-26 DIAGNOSIS — F1910 Other psychoactive substance abuse, uncomplicated: Secondary | ICD-10-CM | POA: Insufficient documentation

## 2019-03-26 DIAGNOSIS — R456 Violent behavior: Secondary | ICD-10-CM | POA: Insufficient documentation

## 2019-03-26 DIAGNOSIS — Z20822 Contact with and (suspected) exposure to covid-19: Secondary | ICD-10-CM | POA: Insufficient documentation

## 2019-03-26 LAB — CBC WITH DIFFERENTIAL/PLATELET
Abs Immature Granulocytes: 0.06 10*3/uL (ref 0.00–0.07)
Basophils Absolute: 0 10*3/uL (ref 0.0–0.1)
Basophils Relative: 0 %
Eosinophils Absolute: 0 10*3/uL (ref 0.0–0.5)
Eosinophils Relative: 0 %
HCT: 32.7 % — ABNORMAL LOW (ref 36.0–46.0)
Hemoglobin: 11.5 g/dL — ABNORMAL LOW (ref 12.0–15.0)
Immature Granulocytes: 0 %
Lymphocytes Relative: 19 %
Lymphs Abs: 2.6 10*3/uL (ref 0.7–4.0)
MCH: 29 pg (ref 26.0–34.0)
MCHC: 35.2 g/dL (ref 30.0–36.0)
MCV: 82.6 fL (ref 80.0–100.0)
Monocytes Absolute: 1.2 10*3/uL — ABNORMAL HIGH (ref 0.1–1.0)
Monocytes Relative: 8 %
Neutro Abs: 10 10*3/uL — ABNORMAL HIGH (ref 1.7–7.7)
Neutrophils Relative %: 73 %
Platelets: 291 10*3/uL (ref 150–400)
RBC: 3.96 MIL/uL (ref 3.87–5.11)
RDW: 13 % (ref 11.5–15.5)
WBC: 13.9 10*3/uL — ABNORMAL HIGH (ref 4.0–10.5)
nRBC: 0 % (ref 0.0–0.2)

## 2019-03-26 LAB — COMPREHENSIVE METABOLIC PANEL
ALT: 104 U/L — ABNORMAL HIGH (ref 0–44)
AST: 80 U/L — ABNORMAL HIGH (ref 15–41)
Albumin: 3.9 g/dL (ref 3.5–5.0)
Alkaline Phosphatase: 132 U/L — ABNORMAL HIGH (ref 38–126)
Anion gap: 15 (ref 5–15)
BUN: 11 mg/dL (ref 6–20)
CO2: 19 mmol/L — ABNORMAL LOW (ref 22–32)
Calcium: 9.3 mg/dL (ref 8.9–10.3)
Chloride: 99 mmol/L (ref 98–111)
Creatinine, Ser: 0.78 mg/dL (ref 0.44–1.00)
GFR calc Af Amer: >60 mL/min (ref >60)
GFR calc non Af Amer: >60 mL/min (ref >60)
Glucose, Bld: 89 mg/dL (ref 70–99)
Potassium: 3.2 mmol/L — ABNORMAL LOW (ref 3.5–5.1)
Sodium: 133 mmol/L — ABNORMAL LOW (ref 135–145)
Total Bilirubin: 1.1 mg/dL (ref 0.3–1.2)
Total Protein: 7 g/dL (ref 6.5–8.1)

## 2019-03-26 LAB — URINALYSIS, COMPLETE (UACMP) WITH MICROSCOPIC
Bilirubin Urine: NEGATIVE
Glucose, UA: NEGATIVE mg/dL
Hgb urine dipstick: NEGATIVE
Ketones, ur: NEGATIVE mg/dL
Nitrite: POSITIVE — AB
Protein, ur: NEGATIVE mg/dL
Specific Gravity, Urine: 1.016 (ref 1.005–1.030)
WBC, UA: >50 WBC/hpf — ABNORMAL HIGH (ref 0–5)
pH: 7 (ref 5.0–8.0)

## 2019-03-26 LAB — URINE DRUG SCREEN, QUALITATIVE (ARMC ONLY)
Amphetamines, Ur Screen: POSITIVE — AB
Barbiturates, Ur Screen: NOT DETECTED
Benzodiazepine, Ur Scrn: NOT DETECTED
Cannabinoid 50 Ng, Ur ~~LOC~~: POSITIVE — AB
Cocaine Metabolite,Ur ~~LOC~~: NOT DETECTED
MDMA (Ecstasy)Ur Screen: NOT DETECTED
Methadone Scn, Ur: NOT DETECTED
Opiate, Ur Screen: NOT DETECTED
Phencyclidine (PCP) Ur S: NOT DETECTED
Tricyclic, Ur Screen: NOT DETECTED

## 2019-03-26 LAB — SALICYLATE LEVEL: Salicylate Lvl: <7 mg/dL — ABNORMAL LOW (ref 7.0–30.0)

## 2019-03-26 LAB — ETHANOL: Alcohol, Ethyl (B): <10 mg/dL (ref <10)

## 2019-03-26 LAB — ACETAMINOPHEN LEVEL: Acetaminophen (Tylenol), Serum: <10 ug/mL — ABNORMAL LOW (ref 10–30)

## 2019-03-26 LAB — CK: Total CK: 948 U/L — ABNORMAL HIGH (ref 38–234)

## 2019-03-26 MED ORDER — LORAZEPAM 2 MG/ML IJ SOLN
2.0000 mg | Freq: Once | INTRAMUSCULAR | Status: AC
  Administered 2019-03-26: 23:00:00 2 mg via INTRAVENOUS

## 2019-03-26 MED ORDER — DIPHENHYDRAMINE HCL 50 MG/ML IJ SOLN
25.0000 mg | Freq: Once | INTRAMUSCULAR | Status: AC
  Administered 2019-03-26: 25 mg via INTRAVENOUS

## 2019-03-26 MED ORDER — SODIUM CHLORIDE 0.9 % IV BOLUS
1000.0000 mL | Freq: Once | INTRAVENOUS | Status: AC
  Administered 2019-03-26: 23:00:00 1000 mL via INTRAVENOUS

## 2019-03-26 MED ORDER — HALOPERIDOL LACTATE 5 MG/ML IJ SOLN
5.0000 mg | Freq: Once | INTRAMUSCULAR | Status: AC
  Administered 2019-03-26: 23:00:00 5 mg via INTRAVENOUS

## 2019-03-26 MED ORDER — SODIUM CHLORIDE 0.9 % IV SOLN
1.0000 g | Freq: Once | INTRAVENOUS | Status: AC
  Administered 2019-03-26: 1 g via INTRAVENOUS
  Filled 2019-03-26: qty 10

## 2019-03-26 NOTE — ED Provider Notes (Signed)
11:12 PM Assumed care for off going team.   Blood pressure (!) 106/54, pulse (!) 115, temperature 99.9 F (37.7 C), temperature source Rectal, resp. rate 18, height 5' (1.524 m), weight 40.8 kg, SpO2 96 %, currently breastfeeding.  See their HPI for full report but in brief suboxone pt and history of overdose who comes in from friends home and after using suboxone, cocaine, fentanyl--> came in agitated. Unable to get full HPI. B52--> tachy 120, initial temperature 100.4 but that was axillary and patient was diaphoretic so they repeated it without any information's right afterwards and it was rectal temp 99. QTC is 520. IVCed. Plan to re-eval.   1:08 AM patient reevaluated still sedated from the medications.  Patient's white count was elevated and urine was consistent with UTI.  To get blood cultures just to be safe and started patient on ceftriaxone for UTI.  4:41 AM Repeat temperature without any fever reducers was 97.6.  I have lower suspicion for meningitis and suspect that the slightly elevated temperature earlier was secondary to the amphetamine use and her significant agitation.  Patient still seems pretty sleepy although will slightly wake up and open her eyes.  We will continue to closely monitor  7:15 AM reevaluated patient and she is slightly waking up but still drowsy from the medications from earlier.  I suspect that a lot of this is secondary to the drug she used earlier with the medications we had to give for her agitation.  We will get a repeat CK level to make sure this is downtrending.  Discussed with on the coming doctor for sober evaluation versus admission if CK is up trending or if patient remains altered.  This time of low suspicion for meningitis given she has been afebrile and is showing improvement.  Patient will need antibiotics for UTI upon discharge          Concha Se, MD 03/27/19 [phone]

## 2019-03-26 NOTE — ED Notes (Signed)
Police have removed handcuffs, pt sleeping.

## 2019-03-26 NOTE — ED Triage Notes (Addendum)
Pt arrives with ems and police escort. Pt with ams, possible drug overdose. Pt is screaming, handcuffs x2 noted to wrists. Per ems friends stated pt began with yelling and hallucinations began at noon after ingesting a "substance", but pt has become aggressive in last hour. Pt does not follow directions or answer questions.

## 2019-03-26 NOTE — ED Provider Notes (Signed)
[facility] EMERGENCY DEPARTMENT Provider Note   CSN: [id number] Arrival date & time: 03/26/19  2221     History Chief Complaint  Patient presents with  . Drug Overdose  . Altered Mental Status    Jill Shepherd is a 24 y.o. female previous spontaneous abortion, uses Suboxone at baseline, here presenting with altered mental status.  Patient is from [facility].  Patient used Suboxone and that no and cocaine prior to arrival.  Patient apparently has not been sleeping and has been hallucinating.  EMS was called and she was so agitated so they called police and she was put on handcuffs.  Patient is unable to give me any history at all.  The history is provided by the EMS personnel.   Level V caveat- AMS     Past Medical History:  Diagnosis Date  . Headache   . SAB (spontaneous abortion) 01/19/2014   stillborn at 20.4 wks    Patient Active Problem List   Diagnosis Date Noted  . Anxiety 08/04/2017  . Postpartum depression 08/04/2017  . Labor and delivery indication for care or intervention 07/17/2017  . SVD (spontaneous vaginal delivery) 07/17/2017  . Pregnancy 07/07/2017  . History of preterm delivery 11/28/2016  . Pregnancy complicated by subutex maintenance, antepartum ([facility]) 11/28/2016  . History of preterm premature rupture of membranes (PPROM) 09/25/2015  . History of pregnancy loss in prior pregnancy, currently pregnant 05/14/2015  . History of drug use 05/14/2015  . Underweight 05/14/2015    No past surgical history on file.   OB History    Gravida  4   Para  3   Term  1   Preterm  2   AB  1   Living  1     SAB  1   TAB      Ectopic      Multiple  0   Live Births  2        Obstetric Comments  2015 - spontaneous preterm labor, fetus passed several minutes after delivery.  2017 - Infant demise at 4 months, suspected SIDS        Family History  Problem Relation Age of Onset  . Hypertension Father   . Hypertension  Paternal Grandfather     Social History   Tobacco Use  . Smoking status: Never Smoker  . Smokeless tobacco: Never Used  Substance Use Topics  . Alcohol use: No    Alcohol/week: 0.0 standard drinks  . Drug use: Yes    Types: Other-see comments    Comment: subutex tx    Home Medications Prior to Admission medications   Medication Sig Start Date End Date Taking? Authorizing Provider  buprenorphine (SUBUTEX) 8 MG SUBL SL tablet Place 8 mg under the tongue daily.     [provider]  Prenatal Vit-Fe Fumarate-FA (PRENATAL MULTIVITAMIN) TABS tablet Take 1 tablet by mouth daily at 12 noon.    [provider]    Allergies    Patient has no known allergies.  Review of Systems   Review of Systems  Psychiatric/Behavioral: Positive for agitation and behavioral problems.  All other systems reviewed and are negative.   Physical Exam Updated Vital Signs BP (!) 106/54   Pulse (!) 115   Temp 99.9 F (37.7 C) (Rectal)   Resp 18   Ht 5' (1.524 m)   Wt 40.8 kg   LMP  (LMP Unknown)   SpO2 96%   BMI 17.58 kg/m  Physical Exam Vitals and nursing note reviewed.  Constitutional:      Comments: Altered   HENT:     Head: Normocephalic.     Mouth/Throat:     Mouth: Mucous membranes are dry.  Eyes:     Comments: Pupils dilated bilaterally   Cardiovascular:     Rate and Rhythm: Regular rhythm. Tachycardia present.     Pulses: Normal pulses.  Pulmonary:     Effort: Pulmonary effort is normal.  Abdominal:     General: Abdomen is flat.     Palpations: Abdomen is soft.  Musculoskeletal:        General: Normal range of motion.     Cervical back: Normal range of motion.  Skin:    General: Skin is warm.     Capillary Refill: Capillary refill takes less than 2 seconds.  Neurological:     Comments: Agitated, moving all extremities   Psychiatric:     Comments: Unable      ED Results / Procedures / Treatments   Labs (all labs ordered are listed, but only  abnormal results are displayed) Labs Reviewed  CBC WITH DIFFERENTIAL/PLATELET - Abnormal; Notable for the following components:      Result Value   WBC 13.9 (*)    Hemoglobin 11.5 (*)    HCT 32.7 (*)    Neutro Abs 10.0 (*)    Monocytes Absolute 1.2 (*)    All other components within normal limits  URINALYSIS, COMPLETE (UACMP) WITH MICROSCOPIC - Abnormal; Notable for the following components:   Color, Urine YELLOW (*)    APPearance HAZY (*)    Nitrite POSITIVE (*)    Leukocytes,Ua MODERATE (*)    WBC, UA >50 (*)    Bacteria, UA MANY (*)    All other components within normal limits  URINE CULTURE  COMPREHENSIVE METABOLIC PANEL  ETHANOL  SALICYLATE LEVEL  HCG, QUANTITATIVE, PREGNANCY  ACETAMINOPHEN LEVEL  URINE DRUG SCREEN, QUALITATIVE ([facility] ONLY)  CK  TROPONIN I (HIGH SENSITIVITY)    EKG EKG Interpretation  Date/Time:  Saturday March 26 2019 22:44:54 EST Ventricular Rate:  117 PR Interval:    QRS Duration: 86 QT Interval:  375 QTC Calculation: 524 R Axis:   78 Text Interpretation: Sinus tachycardia Probable left atrial enlargement RSR' in V1 or V2, probably normal variant Borderline Q waves in lateral leads Borderline repolarization abnormality Prolonged QT interval Since last tracing rate faster Confirmed by Richardean Canal [phone]) on 03/26/2019 10:54:43 PM   Radiology DG Chest Port 1 View  Result Date: 03/26/2019 CLINICAL DATA:  Fever, altered level of consciousness, hallucinations EXAM: PORTABLE CHEST 1 VIEW COMPARISON:  None. FINDINGS: The heart size and mediastinal contours are within normal limits. Both lungs are clear. The visualized skeletal structures are unremarkable. IMPRESSION: No active disease. Electronically Signed   By: Sharlet Salina M.D.   On: 03/26/2019 23:04    Procedures Procedures (including critical care time)  Medications Ordered in ED Medications  LORazepam (ATIVAN) injection 2 mg (2 mg Intravenous Given 03/26/19 2232)  diphenhydrAMINE  (BENADRYL) injection 25 mg (25 mg Intravenous Given 03/26/19 2232)  haloperidol lactate (HALDOL) injection 5 mg (5 mg Intravenous Given 03/26/19 2232)  sodium chloride 0.9 % bolus 1,000 mL (1,000 mLs Intravenous New Bag/Given 03/26/19 2242)    ED Course  I have reviewed the triage vital signs and the nursing notes.  Pertinent labs & imaging results that were available during my care of the patient were reviewed by me and considered  in my medical decision making (see chart for details).    MDM Rules/Calculators/A&P                      Jill Shepherd is a 24 y.o. female here presenting with altered mental status.  Patient was using drugs prior to arrival.  Patient is very agitated and aggressive.  I was unable to get much meaningful history from her. Patient also has low-grade temperature in the ED is tachycardic.  I filled out IVC paperwork since she is in danger to herself and others.  Also gave her Ativan and Haldol for agitation.  Her QTC is prolonged to 520 after the Haldol.  Will get CBC, CMP, tox. Will hydrate and likely need psych consult   11:22 PM Labs pending. Temp is 99.9 rectally. Signed out to Dr. Jari Pigg to follow up lab work and reassess patient.   Final Clinical Impression(s) / ED Diagnoses Final diagnoses:  None    Rx / DC Orders ED Discharge Orders    None       Drenda Freeze, MD 03/26/19 2322

## 2019-03-27 ENCOUNTER — Emergency Department: Payer: Self-pay

## 2019-03-27 LAB — RESPIRATORY PANEL BY RT PCR (FLU A&B, COVID)
Influenza A by PCR: NEGATIVE
Influenza B by PCR: NEGATIVE
SARS Coronavirus 2 by RT PCR: NEGATIVE

## 2019-03-27 LAB — LACTIC ACID, PLASMA: Lactic Acid, Venous: 0.9 mmol/L (ref 0.5–1.9)

## 2019-03-27 LAB — TROPONIN I (HIGH SENSITIVITY): Troponin I (High Sensitivity): 8 ng/L

## 2019-03-27 LAB — CK: Total CK: 384 U/L — ABNORMAL HIGH (ref 38–234)

## 2019-03-27 LAB — HCG, QUANTITATIVE, PREGNANCY: hCG, Beta Chain, Quant, S: 1 m[IU]/mL (ref <5)

## 2019-03-27 MED ORDER — LACTATED RINGERS IV BOLUS
1000.0000 mL | Freq: Once | INTRAVENOUS | Status: AC
  Administered 2019-03-27: 04:00:00 1000 mL via INTRAVENOUS

## 2019-03-27 NOTE — ED Notes (Signed)
Report to michele, rn.  

## 2019-03-27 NOTE — ED Notes (Signed)
Pt's panties and black sweatpants at bedside in chair. Pt has on copper colored metal necklace with grey metal/copper colored cross charm and olive colored t shirt.

## 2019-03-27 NOTE — ED Provider Notes (Signed)
-----------------------------------------   11:02 AM on 03/27/2019 -----------------------------------------  Patient is awake alert now.  Requesting a phone to call for a ride home.  Patient's lab work shows a decreasing CK down to 380.  Patient appears clinically sober able to contract for her own safety appears able to adequately care for self.  She will call her friend to come pick her up.  We will rescind the IVC as the patient is no longer intoxicated and is calm and cooperative.   Minna Antis, MD 03/27/19 [phone]

## 2019-03-27 NOTE — ED Notes (Signed)
Pt sleeping. 

## 2019-03-27 NOTE — ED Notes (Signed)
Pt continues to sleep, returned from ct.

## 2019-03-27 NOTE — ED Notes (Signed)
Pt ambulatory to bathroom and wheelchair with steady gait. Pt wheeled out to mother who was picking her up.

## 2019-03-29 LAB — URINE CULTURE: Culture: >=100000 — AB

## 2019-04-01 LAB — CULTURE, BLOOD (ROUTINE X 2)
Culture: NO GROWTH
Culture: NO GROWTH
Special Requests: ADEQUATE
Special Requests: ADEQUATE

## 2019-04-17 ENCOUNTER — Other Ambulatory Visit: Payer: Self-pay

## 2019-04-17 ENCOUNTER — Emergency Department
Admission: EM | Admit: 2019-04-17 | Discharge: 2019-04-18 | Disposition: A | Discharge status: Home / Self Care | Payer: Self-pay | Attending: Emergency Medicine | Admitting: Emergency Medicine

## 2019-04-17 DIAGNOSIS — Z79899 Other long term (current) drug therapy: Secondary | ICD-10-CM | POA: Insufficient documentation

## 2019-04-17 DIAGNOSIS — F1510 Other stimulant abuse, uncomplicated: Secondary | ICD-10-CM | POA: Insufficient documentation

## 2019-04-17 DIAGNOSIS — F1910 Other psychoactive substance abuse, uncomplicated: Secondary | ICD-10-CM | POA: Insufficient documentation

## 2019-04-17 LAB — GLUCOSE, CAPILLARY
Glucose-Capillary: 69 mg/dL — ABNORMAL LOW (ref 70–99)
Glucose-Capillary: 147 mg/dL — ABNORMAL HIGH (ref 70–99)
Glucose-Capillary: 86 mg/dL (ref 70–99)
Glucose-Capillary: 70 mg/dL (ref 70–99)
Glucose-Capillary: 59 mg/dL — ABNORMAL LOW (ref 70–99)
Glucose-Capillary: 134 mg/dL — ABNORMAL HIGH (ref 70–99)
Glucose-Capillary: 48 mg/dL — ABNORMAL LOW (ref 70–99)
Glucose-Capillary: 92 mg/dL (ref 70–99)
Glucose-Capillary: 101 mg/dL — ABNORMAL HIGH (ref 70–99)
Glucose-Capillary: 91 mg/dL (ref 70–99)

## 2019-04-17 LAB — BASIC METABOLIC PANEL
Anion gap: 8 (ref 5–15)
BUN: 20 mg/dL (ref 6–20)
CO2: 21 mmol/L — ABNORMAL LOW (ref 22–32)
Calcium: 8.5 mg/dL — ABNORMAL LOW (ref 8.9–10.3)
Chloride: 107 mmol/L (ref 98–111)
Creatinine, Ser: 0.49 mg/dL (ref 0.44–1.00)
GFR calc Af Amer: >60 mL/min (ref >60)
GFR calc non Af Amer: >60 mL/min (ref >60)
Glucose, Bld: 121 mg/dL — ABNORMAL HIGH (ref 70–99)
Potassium: 3.9 mmol/L (ref 3.5–5.1)
Sodium: 136 mmol/L (ref 135–145)

## 2019-04-17 LAB — CBC
HCT: 34.9 % — ABNORMAL LOW (ref 36.0–46.0)
Hemoglobin: 12.4 g/dL (ref 12.0–15.0)
MCH: 30.1 pg (ref 26.0–34.0)
MCHC: 35.5 g/dL (ref 30.0–36.0)
MCV: 84.7 fL (ref 80.0–100.0)
Platelets: 275 10*3/uL (ref 150–400)
RBC: 4.12 MIL/uL (ref 3.87–5.11)
RDW: 14 % (ref 11.5–15.5)
WBC: 14.6 10*3/uL — ABNORMAL HIGH (ref 4.0–10.5)
nRBC: 0 % (ref 0.0–0.2)

## 2019-04-17 LAB — COMPREHENSIVE METABOLIC PANEL
ALT: 69 U/L — ABNORMAL HIGH (ref 0–44)
AST: 151 U/L — ABNORMAL HIGH (ref 15–41)
Albumin: 4.4 g/dL (ref 3.5–5.0)
Alkaline Phosphatase: 78 U/L (ref 38–126)
Anion gap: 17 — ABNORMAL HIGH (ref 5–15)
BUN: 25 mg/dL — ABNORMAL HIGH (ref 6–20)
CO2: 20 mmol/L — ABNORMAL LOW (ref 22–32)
Calcium: 9.8 mg/dL (ref 8.9–10.3)
Chloride: 99 mmol/L (ref 98–111)
Creatinine, Ser: 0.8 mg/dL (ref 0.44–1.00)
GFR calc Af Amer: >60 mL/min (ref >60)
GFR calc non Af Amer: >60 mL/min (ref >60)
Glucose, Bld: 85 mg/dL (ref 70–99)
Potassium: 3.9 mmol/L (ref 3.5–5.1)
Sodium: 136 mmol/L (ref 135–145)
Total Bilirubin: 1.7 mg/dL — ABNORMAL HIGH (ref 0.3–1.2)
Total Protein: 7.1 g/dL (ref 6.5–8.1)

## 2019-04-17 LAB — CK
Total CK: 5229 U/L — ABNORMAL HIGH (ref 38–234)
Total CK: 2450 U/L — ABNORMAL HIGH (ref 38–234)
Total CK: 1959 U/L — ABNORMAL HIGH (ref 38–234)

## 2019-04-17 MED ORDER — SODIUM CHLORIDE 0.9 % IV BOLUS
1000.0000 mL | Freq: Once | INTRAVENOUS | Status: AC
  Administered 2019-04-17: 1000 mL via INTRAVENOUS

## 2019-04-17 MED ORDER — DIPHENHYDRAMINE HCL 50 MG/ML IJ SOLN: 50.0000 mg | Freq: Once | INTRAMUSCULAR | Status: AC

## 2019-04-17 MED ORDER — LORAZEPAM 2 MG/ML IJ SOLN
1.0000 mg | Freq: Once | INTRAMUSCULAR | Status: AC
  Administered 2019-04-17: 1 mg via INTRAVENOUS

## 2019-04-17 MED ORDER — DIPHENHYDRAMINE HCL 50 MG/ML IJ SOLN: 50.0000 mg | Freq: Once | INTRAMUSCULAR | Status: DC

## 2019-04-17 MED ORDER — HALOPERIDOL LACTATE 5 MG/ML IJ SOLN: 5.0000 mg | Freq: Once | INTRAMUSCULAR | Status: DC

## 2019-04-17 MED ORDER — LORAZEPAM 2 MG/ML IJ SOLN
2.0000 mg | Freq: Once | INTRAMUSCULAR | Status: DC
  Filled 2019-04-17: qty 1

## 2019-04-17 MED ORDER — HALOPERIDOL LACTATE 5 MG/ML IJ SOLN: 5.0000 mg | Freq: Once | INTRAMUSCULAR | Status: AC

## 2019-04-17 MED ORDER — DIPHENHYDRAMINE HCL 50 MG/ML IJ SOLN
INTRAMUSCULAR | Status: AC
  Administered 2019-04-17: 50 mg via INTRAVENOUS
  Filled 2019-04-17: qty 1

## 2019-04-17 MED ORDER — HALOPERIDOL LACTATE 5 MG/ML IJ SOLN
INTRAMUSCULAR | Status: AC
  Administered 2019-04-17: 5 mg via INTRAVENOUS
  Filled 2019-04-17: qty 1

## 2019-04-17 MED ORDER — DEXTROSE 10 % IV BOLUS
250.0000 mL | Freq: Once | INTRAVENOUS | Status: AC
  Administered 2019-04-17: 250 mL via INTRAVENOUS
  Filled 2019-04-17: qty 500

## 2019-04-17 MED ORDER — LORAZEPAM 2 MG/ML IJ SOLN
INTRAMUSCULAR | Status: AC
  Filled 2019-04-17: qty 1

## 2019-04-17 MED ORDER — DEXTROSE 10 % IV SOLN
250.0000 mL | Freq: Once | INTRAVENOUS | Status: AC
  Administered 2019-04-17: 14:00:00 250 mL via INTRAVENOUS

## 2019-04-17 NOTE — ED Notes (Signed)
Pt awake at this time- appears more alert, is oriented x4.

## 2019-04-17 NOTE — ED Provider Notes (Signed)
Vitals:   04/17/19 0504 04/17/19 0619  BP:  (!) 90/45  Pulse:  81  Resp:  14  Temp:  98.8 F (37.1 C)  SpO2: 100% 100%     Patient somnolent on reexam. Repots to tactile stimuli. No distress. Appears dry mucous membranes. Continue to observe closely.    Sharyn Creamer, MD 04/17/19 [phone]

## 2019-04-17 NOTE — ED Notes (Signed)
Report given to Lexie, RN 

## 2019-04-17 NOTE — ED Provider Notes (Signed)
Repeat CK is downtrending.  Patient alertness continues to improve over time.  Ongoing care assigned to Dr. Prescott Gum, MD 04/17/19 [phone]

## 2019-04-17 NOTE — ED Notes (Signed)
Pt lying down in bed asleep (eyes closed with equal unlabored RR) showing no signs of acute distress, VS noted on the monitor to be WNL

## 2019-04-17 NOTE — ED Provider Notes (Signed)
Patient level of alertness improving.  Stable vital signs.  She awakens to voice now, she is able to give her name and becoming much more coherent.  Continue to observe closely at this time, but appears to be improving.   Sharyn Creamer, MD 04/17/19 [phone]

## 2019-04-17 NOTE — ED Provider Notes (Addendum)
[facility] Emergency Department Provider Note _____________   First MD Initiated Contact with Patient 04/17/19 [phone]     (approximate)  I have reviewed the triage vital signs and the nursing notes.   HISTORY Level 5 caveat history review of system limited secondary to altered mental status  Chief Complaint Drug Overdose and Altered Mental Status   HPI Jill Shepherd is a 24 y.o. female with below list of previous medical conditions including methamphetamine abuse presents to the emergency department via EMS and [employer] Department secondary to bizarre/agitated behavior.  Patient does admit to methamphetamine use "yesterday".  Patient very restless and agitated on arrival to the emergency department       Past Medical History:  Diagnosis Date  . Headache   . SAB (spontaneous abortion) 01/19/2014   stillborn at 20.4 wks    Patient Active Problem List   Diagnosis Date Noted  . Anxiety 08/04/2017  . Postpartum depression 08/04/2017  . Labor and delivery indication for care or intervention 07/17/2017  . SVD (spontaneous vaginal delivery) 07/17/2017  . Pregnancy 07/07/2017  . History of preterm delivery 11/28/2016  . Pregnancy complicated by subutex maintenance, antepartum (HCC) 11/28/2016  . History of preterm premature rupture of membranes (PPROM) 09/25/2015  . History of pregnancy loss in prior pregnancy, currently pregnant 05/14/2015  . History of drug use 05/14/2015  . Underweight 05/14/2015    No past surgical history on file.  Prior to Admission medications   Medication Sig Start Date End Date Taking? Authorizing Provider  buprenorphine (SUBUTEX) 8 MG SUBL SL tablet Place 8 mg under the tongue daily.     [provider]  Prenatal Vit-Fe Fumarate-FA (PRENATAL MULTIVITAMIN) TABS tablet Take 1 tablet by mouth daily at 12 noon.    [provider]    Allergies Patient has no known allergies.  Family History    Problem Relation Age of Onset  . Hypertension Father   . Hypertension Paternal Grandfather     Social History Social History   Tobacco Use  . Smoking status: Never Smoker  . Smokeless tobacco: Never Used  Substance Use Topics  . Alcohol use: No    Alcohol/week: 0.0 standard drinks  . Drug use: Yes    Types: Other-see comments    Comment: subutex tx    Review of Systems Constitutional: No fever/chills Eyes: No visual changes. ENT: No sore throat. Cardiovascular: Denies chest pain. Respiratory: Denies shortness of breath. Gastrointestinal: No abdominal pain.  No nausea, no vomiting.  No diarrhea.  No constipation. Genitourinary: Negative for dysuria. Musculoskeletal: Negative for neck pain.  Negative for back pain. Integumentary: Negative for rash. Neurological: Negative for headaches, focal weakness or numbness. Psychiatric:  Positive for stated illicit drug use and bizarre behavior   ____________________________________________   PHYSICAL EXAM:  VITAL SIGNS: ED Triage Vitals  Enc Vitals Group     BP      Pulse      Resp      Temp      Temp src      SpO2      Weight      Height      Head Circumference      Peak Flow      Pain Score      Pain Loc      Pain Edu?      Excl. in GC?     Constitutional: Alert, restless agitated very bizarre affect Eyes: Conjunctivae are normal.  Head:  Atraumatic. Mouth/Throat: Dry oral mucosa. Neck: No stridor.  No meningeal signs.   Cardiovascular: Normal rate, regular rhythm. Good peripheral circulation. Grossly normal heart sounds. Respiratory: Normal respiratory effort.  No retractions. Gastrointestinal: Soft and nontender. No distention.  Musculoskeletal: No lower extremity tenderness nor edema. No gross deformities of extremities. Neurologic:  Normal speech and language. No gross focal neurologic deficits are appreciated.  Skin:  Skin is warm, dry and intact. Psychiatric: Mood and affect are normal. Speech and  behavior are normal.  ____________________________________________   LABS (all labs ordered are listed, but only abnormal results are displayed)  Labs Reviewed  CBC - Abnormal; Notable for the following components:      Result Value   WBC 14.6 (*)    HCT 34.9 (*)    All other components within normal limits  COMPREHENSIVE METABOLIC PANEL  CK  URINE DRUG SCREEN, QUALITATIVE ([facility] ONLY)   ____________________________________________  EKG  ED ECG REPORT I, [place] Jezreel Justiniano, the attending physician, personally viewed and interpreted this ECG.   Date: 04/17/2019  EKG Time: 5:15 AM  Rate: 113  Rhythm: Sinus tachycardia  Axis: Normal  Intervals: Normal  ST&T Change: None   Procedures   ____________________________________________   INITIAL IMPRESSION / MDM / ASSESSMENT AND PLAN / ED COURSE  As part of my medical decision making, I reviewed the following data within the electronic MEDICAL RECORD NUMBER   24 year old female presented with above-stated history and physical exam consistent with stimulant drug use disorder.  Patient given Ativan 2 mg IV Benadryl 50 mg and Haldol 5 mg.  On reevaluation patient resting comfortably, vital signs stable.  Laboratory data pending including CK given concern for possible rhabdomyolysis ____________________________________________  FINAL CLINICAL IMPRESSION(S) / ED DIAGNOSES  Final diagnoses:  Methamphetamine abuse ([place])     MEDICATIONS GIVEN DURING THIS VISIT:  Medications  LORazepam (ATIVAN) injection 1 mg (1 mg Intravenous Given 04/17/19 0456)  LORazepam (ATIVAN) injection 1 mg (1 mg Intravenous Given 04/17/19 0511)  haloperidol lactate (HALDOL) injection 5 mg (5 mg Intravenous Given 04/17/19 0512)  diphenhydrAMINE (BENADRYL) injection 50 mg (50 mg Intravenous Given 04/17/19 8099)     ED Discharge Orders    None      *Please note:  Jill Shepherd was evaluated in Emergency Department on 04/17/2019 for the symptoms described in  the history of present illness. She was evaluated in the context of the global COVID-19 pandemic, which necessitated consideration that the patient might be at risk for infection with the SARS-CoV-2 virus that causes COVID-19. Institutional protocols and algorithms that pertain to the evaluation of patients at risk for COVID-19 are in a state of rapid change based on information released by regulatory bodies including the CDC and federal and state organizations. These policies and algorithms were followed during the patient's care in the ED.  Some ED evaluations and interventions may be delayed as a result of limited staffing during the pandemic.*  Note:  This document was prepared using Dragon voice recognition software and may include unintentional dictation errors.   Gregor Hams, MD 04/17/19 8338    Gregor Hams, MD 04/17/19 2351

## 2019-04-17 NOTE — ED Notes (Signed)
Pt opens eyes to voice but still fails to follow commands, pt falls asleep after being aroused for finger stick. When pt wakes up for a brief moment she thrashes around in confusion

## 2019-04-17 NOTE — ED Notes (Signed)
Pt opens eyes to voice and thrashes around when awake. This RN asked pt to put her hand out from under her body so it could be stuck for a glucose check and she followed this command. Pt still drowsy and states "I am cold". Pt given blanket. Pt back to sleep at this time

## 2019-04-17 NOTE — ED Notes (Signed)
Attempted to arouse pt. Pt responds to touch and painful stimuli by moving around in bed, pt does not follow commands or answer questions asked by this RN, Dr. Fanny Bien made aware

## 2019-04-17 NOTE — ED Notes (Signed)
Pt assisted with calling stepmother X2- no answer. MD notified.

## 2019-04-17 NOTE — ED Notes (Signed)
Pt placed on 2LNC due to oxygen desaturation (high 70's-mid 80's) intermittently while asleep. Pt placed in high fowlers with neck in neutral position to assist oxygenation efforts

## 2019-04-17 NOTE — ED Notes (Signed)
Dr. Lenard Lance informed of pt's glucose reading of 48, pt given dinner tray with grilled chicken sandwich, vegetables and apple juice

## 2019-04-17 NOTE — ED Notes (Signed)
Pt arouses to painful stimuli and answers questions and eats small amounts, but falls asleep quickly and is very drowsy. ED MD reevaluating at this time.

## 2019-04-17 NOTE — ED Provider Notes (Addendum)
-----------------------------------------   6:29 PM on 04/17/2019 -----------------------------------------  Patient care assumed from Dr. Fanny Bien.  Patient's blood glucose has dropped to 48 once again.  She states she is hungry.  Patient still appears somnolent but does awaken to voice is now answering questions appropriately is asking for food.  We will feed and continue to closely monitor.  Does appear to be improving with time.   ----------------------------------------- 10:12 PM on 04/17/2019 -----------------------------------------  Patient continues to have decreasing CK.  Blood glucose is up to 101 after eating approximately half of her meal tray.  Patient is more alert and awake now.  Patient states she does not want to go to detox or rehab.  She states she is homeless, I offered to have a social work talk to her in the morning about shelter placement.  Patient states she does not want to talk to anyone she wants to leave.  She is attempting to call someone on her phone but her phone died.  We will attempt to provide a phone to the patient if she wishes to call someone to pick her up, otherwise she may need social work to see her in the morning.   Minna Antis, MD 04/17/19 2213  ----------------------------------------- 11:46 PM on 04/17/2019 -----------------------------------------  Patient is awake, much more appropriate at this time.  Currently trying to contact her stepmother to pick her up but the stepmother is not answering.  Nurses tried to contact the stepmother as well who is not answering.  We will continue to monitor in the emergency department till the patient has an appropriate disposition.    Minna Antis, MD 04/17/19 [phone]

## 2019-04-17 NOTE — ED Notes (Signed)
Verbal order per Dr. Fanny Bien to give pt 1 liter NS bolus stat and recheck BG level

## 2019-04-17 NOTE — ED Triage Notes (Addendum)
Pt arrived to ED via [facility] with [employer] escort for drug overdose. Per EMS report, the individuals with the pt stated that the pt had snorted Meth, Fentanyl and Heroine yesterday with no use reported today. EMS informed this RN that (per the pt's associates)  the pt had left earlier in the night and came back delirious and "high".   Pt stammering and walking with an unsteady gait.  Pt's speech incomprehensible at times and unable to answer questions appropriately. Pt was escorted in by [employer] due to her behavior with EMS>   Pt not redirectable at this time. EDP notified

## 2019-04-17 NOTE — ED Notes (Signed)
Pt still very sleepy but awakens to verbal stimuli and to touch by thrashing around and then goes back to sleep

## 2019-04-18 LAB — GLUCOSE, CAPILLARY
Glucose-Capillary: 101 mg/dL — ABNORMAL HIGH (ref 70–99)
Glucose-Capillary: 71 mg/dL (ref 70–99)
Glucose-Capillary: 74 mg/dL (ref 70–99)
Glucose-Capillary: 75 mg/dL (ref 70–99)
Glucose-Capillary: 76 mg/dL (ref 70–99)
Glucose-Capillary: 76 mg/dL (ref 70–99)
Glucose-Capillary: 87 mg/dL (ref 70–99)

## 2019-04-18 LAB — CK: Total CK: 1579 U/L — ABNORMAL HIGH (ref 38–234)

## 2019-04-18 LAB — BASIC METABOLIC PANEL
Anion gap: 6 (ref 5–15)
BUN: 16 mg/dL (ref 6–20)
CO2: 24 mmol/L (ref 22–32)
Calcium: 8.1 mg/dL — ABNORMAL LOW (ref 8.9–10.3)
Chloride: 110 mmol/L (ref 98–111)
Creatinine, Ser: 0.47 mg/dL (ref 0.44–1.00)
GFR calc Af Amer: >60 mL/min (ref >60)
GFR calc non Af Amer: >60 mL/min (ref >60)
Glucose, Bld: 82 mg/dL (ref 70–99)
Potassium: 3.4 mmol/L — ABNORMAL LOW (ref 3.5–5.1)
Sodium: 140 mmol/L (ref 135–145)

## 2019-04-18 MED ORDER — LACTATED RINGERS IV BOLUS
1000.0000 mL | Freq: Once | INTRAVENOUS | Status: AC
  Administered 2019-04-18: 1000 mL via INTRAVENOUS

## 2019-04-18 NOTE — ED Provider Notes (Addendum)
-----------------------------------------   1:11 AM on 04/18/2019 -----------------------------------------  Originally Dr. Lenard Lance was hoping to discharge the patient but she was unable to find a ride and she is unable to stay awake to the point that it does not seem safe to discharge her even to the lobby.  I have put her back into "in process" and we will continue to monitor her with hourly fingersticks for her blood glucose.  Given that she still has an elevated CK even though it has been trending downward, I will also give her a liter of lactated Ringer's.  Given her brief episodes of hypoglycemia consider giving D5 normal saline but I think it is better for [place] to verify that she can maintain her glucose without getting dextrose in the infusion.  We will continue to monitor.  She is here voluntarily so if she is able to wake up it seems clinically sober she can leave at her convenience.   ----------------------------------------- 6:52 AM on 04/18/2019 -----------------------------------------  The patient is still asleep.  She will wake up and speak but falls right back to sleep.  Repeat basic metabolic panel is reassuring with no evidence of kidney injury and her repeat CK continues to trend downward.  Patient can be discharged when she is clinically sober and able to stay awake and has someone to come pick her up.  Glucose had been trending down but remains stable in the 70s, and without any physiological reason for her glucose to keep dropping, I authorized discontinuation of the hourly CBGs.   Loleta Rose, MD 04/18/19 [phone]

## 2019-04-18 NOTE — ED Provider Notes (Signed)
-----------------------------------------   8:43 AM on 04/18/2019 -----------------------------------------  Blood pressure (!) 100/49, pulse 63, temperature 98.8 F (37.1 C), temperature source Axillary, resp. rate 14, height 5' (1.524 m), weight 40.8 kg, SpO2 98 %, currently breastfeeding.  Assuming care from Dr. York Cerise.  In short, Jill Shepherd is a 24 y.o. female with a chief complaint of Drug Overdose and Altered Mental Status .  Refer to the original H&P for additional details.  The current plan of care is to discharge home once clinically sober and patient has a safe ride.  CK has been downtrending and glucose stable.  Patient noted to be clinically sober and appropriate for discharge home.    Chesley Noon, MD 04/18/19 1650

## 2019-04-18 NOTE — ED Notes (Signed)
Pt verbalized understanding of discharge instructions. NAD at this time. 

## 2019-04-18 NOTE — ED Notes (Signed)
Multiple attempts made by this RN to reach Pt's stepmother to pick up pt upon discharge. Attempts have been unsuccessful so far, will continue to call until pt can be discharged safely.

## 2019-05-05 ENCOUNTER — Other Ambulatory Visit: Payer: Self-pay

## 2019-05-05 DIAGNOSIS — R64 Cachexia: Secondary | ICD-10-CM | POA: Diagnosis present

## 2019-05-05 DIAGNOSIS — Z7151 Drug abuse counseling and surveillance of drug abuser: Secondary | ICD-10-CM

## 2019-05-05 DIAGNOSIS — Z8614 Personal history of Methicillin resistant Staphylococcus aureus infection: Secondary | ICD-10-CM

## 2019-05-05 DIAGNOSIS — Z8249 Family history of ischemic heart disease and other diseases of the circulatory system: Secondary | ICD-10-CM

## 2019-05-05 DIAGNOSIS — F1510 Other stimulant abuse, uncomplicated: Secondary | ICD-10-CM | POA: Diagnosis present

## 2019-05-05 DIAGNOSIS — L02512 Cutaneous abscess of left hand: Secondary | ICD-10-CM | POA: Diagnosis present

## 2019-05-05 DIAGNOSIS — A4102 Sepsis due to Methicillin resistant Staphylococcus aureus: Principal | ICD-10-CM | POA: Diagnosis present

## 2019-05-05 DIAGNOSIS — Z20822 Contact with and (suspected) exposure to covid-19: Secondary | ICD-10-CM | POA: Diagnosis present

## 2019-05-05 DIAGNOSIS — L02412 Cutaneous abscess of left axilla: Secondary | ICD-10-CM | POA: Diagnosis present

## 2019-05-05 DIAGNOSIS — F419 Anxiety disorder, unspecified: Secondary | ICD-10-CM | POA: Diagnosis present

## 2019-05-05 DIAGNOSIS — L03317 Cellulitis of buttock: Secondary | ICD-10-CM | POA: Diagnosis present

## 2019-05-05 DIAGNOSIS — E872 Acidosis: Secondary | ICD-10-CM | POA: Diagnosis present

## 2019-05-05 DIAGNOSIS — L02511 Cutaneous abscess of right hand: Secondary | ICD-10-CM | POA: Diagnosis present

## 2019-05-05 DIAGNOSIS — F1110 Opioid abuse, uncomplicated: Secondary | ICD-10-CM | POA: Diagnosis present

## 2019-05-05 DIAGNOSIS — L0231 Cutaneous abscess of buttock: Secondary | ICD-10-CM | POA: Diagnosis present

## 2019-05-05 LAB — COMPREHENSIVE METABOLIC PANEL
ALT: 35 U/L (ref 0–44)
AST: 37 U/L (ref 15–41)
Albumin: 4 g/dL (ref 3.5–5.0)
Alkaline Phosphatase: 89 U/L (ref 38–126)
Anion gap: 13 (ref 5–15)
BUN: 21 mg/dL — ABNORMAL HIGH (ref 6–20)
CO2: 22 mmol/L (ref 22–32)
Calcium: 9.4 mg/dL (ref 8.9–10.3)
Chloride: 102 mmol/L (ref 98–111)
Creatinine, Ser: 0.51 mg/dL (ref 0.44–1.00)
GFR calc Af Amer: >60 mL/min (ref >60)
GFR calc non Af Amer: >60 mL/min (ref >60)
Glucose, Bld: 135 mg/dL — ABNORMAL HIGH (ref 70–99)
Potassium: 3.6 mmol/L (ref 3.5–5.1)
Sodium: 137 mmol/L (ref 135–145)
Total Bilirubin: 0.7 mg/dL (ref 0.3–1.2)
Total Protein: 7.3 g/dL (ref 6.5–8.1)

## 2019-05-05 LAB — CBC
HCT: 38.3 % (ref 36.0–46.0)
Hemoglobin: 13.2 g/dL (ref 12.0–15.0)
MCH: 29.5 pg (ref 26.0–34.0)
MCHC: 34.5 g/dL (ref 30.0–36.0)
MCV: 85.7 fL (ref 80.0–100.0)
Platelets: 353 10*3/uL (ref 150–400)
RBC: 4.47 MIL/uL (ref 3.87–5.11)
RDW: 14 % (ref 11.5–15.5)
WBC: 16.9 10*3/uL — ABNORMAL HIGH (ref 4.0–10.5)
nRBC: 0 % (ref 0.0–0.2)

## 2019-05-05 LAB — LACTIC ACID, PLASMA: Lactic Acid, Venous: 2.5 mmol/L (ref 0.5–1.9)

## 2019-05-05 NOTE — ED Triage Notes (Signed)
Pt here with co multiple abscesses to thumbs bilat, right buttocks, left axilla for few days. Pt uses meth and heroin, states has hx of abscesses in the past.

## 2019-05-06 ENCOUNTER — Inpatient Hospital Stay
Admission: EM | Admit: 2019-05-06 | Discharge: 2019-05-10 | DRG: 854 | Disposition: A | Discharge status: Home / Self Care | Payer: Self-pay | Attending: Internal Medicine | Admitting: Internal Medicine

## 2019-05-06 ENCOUNTER — Encounter: Payer: Self-pay | Admitting: Internal Medicine

## 2019-05-06 ENCOUNTER — Inpatient Hospital Stay: Payer: Self-pay

## 2019-05-06 ENCOUNTER — Emergency Department: Payer: Self-pay

## 2019-05-06 DIAGNOSIS — F1110 Opioid abuse, uncomplicated: Secondary | ICD-10-CM | POA: Diagnosis present

## 2019-05-06 DIAGNOSIS — L03019 Cellulitis of unspecified finger: Secondary | ICD-10-CM

## 2019-05-06 DIAGNOSIS — L0291 Cutaneous abscess, unspecified: Secondary | ICD-10-CM | POA: Diagnosis present

## 2019-05-06 DIAGNOSIS — F1510 Other stimulant abuse, uncomplicated: Secondary | ICD-10-CM | POA: Diagnosis present

## 2019-05-06 DIAGNOSIS — L089 Local infection of the skin and subcutaneous tissue, unspecified: Secondary | ICD-10-CM

## 2019-05-06 DIAGNOSIS — F419 Anxiety disorder, unspecified: Secondary | ICD-10-CM | POA: Diagnosis present

## 2019-05-06 DIAGNOSIS — A419 Sepsis, unspecified organism: Secondary | ICD-10-CM | POA: Diagnosis present

## 2019-05-06 DIAGNOSIS — Z87898 Personal history of other specified conditions: Secondary | ICD-10-CM

## 2019-05-06 DIAGNOSIS — F1991 Other psychoactive substance use, unspecified, in remission: Secondary | ICD-10-CM

## 2019-05-06 HISTORY — DX: Other psychoactive substance abuse, uncomplicated: F19.10

## 2019-05-06 LAB — URINALYSIS, COMPLETE (UACMP) WITH MICROSCOPIC
Bilirubin Urine: NEGATIVE
Glucose, UA: NEGATIVE mg/dL
Ketones, ur: NEGATIVE mg/dL
Nitrite: NEGATIVE
Protein, ur: NEGATIVE mg/dL
Specific Gravity, Urine: 1.004 — ABNORMAL LOW (ref 1.005–1.030)
pH: 7 (ref 5.0–8.0)

## 2019-05-06 LAB — RESPIRATORY PANEL BY RT PCR (FLU A&B, COVID)
Influenza A by PCR: NEGATIVE
Influenza B by PCR: NEGATIVE
SARS Coronavirus 2 by RT PCR: NEGATIVE

## 2019-05-06 LAB — C-REACTIVE PROTEIN: CRP: 2.9 mg/dL — ABNORMAL HIGH (ref <1.0)

## 2019-05-06 LAB — URINE DRUG SCREEN, QUALITATIVE (ARMC ONLY)
Amphetamines, Ur Screen: POSITIVE — AB
Barbiturates, Ur Screen: NOT DETECTED
Benzodiazepine, Ur Scrn: NOT DETECTED
Cannabinoid 50 Ng, Ur ~~LOC~~: NOT DETECTED
Cocaine Metabolite,Ur ~~LOC~~: NOT DETECTED
MDMA (Ecstasy)Ur Screen: NOT DETECTED
Methadone Scn, Ur: NOT DETECTED
Opiate, Ur Screen: NOT DETECTED
Phencyclidine (PCP) Ur S: NOT DETECTED
Tricyclic, Ur Screen: NOT DETECTED

## 2019-05-06 LAB — POCT PREGNANCY, URINE: Preg Test, Ur: NEGATIVE

## 2019-05-06 LAB — SEDIMENTATION RATE: Sed Rate: 14 mm/hr (ref 0–20)

## 2019-05-06 LAB — LACTIC ACID, PLASMA: Lactic Acid, Venous: 0.7 mmol/L (ref 0.5–1.9)

## 2019-05-06 LAB — PROCALCITONIN: Procalcitonin: <0.1 ng/mL

## 2019-05-06 MED ORDER — ONDANSETRON HCL 4 MG/2ML IJ SOLN: 4.0000 mg | Freq: Three times a day (TID) | INTRAMUSCULAR | Status: DC | PRN

## 2019-05-06 MED ORDER — SODIUM CHLORIDE 0.9 % IV SOLN
2.0000 g | Freq: Three times a day (TID) | INTRAVENOUS | Status: DC
  Administered 2019-05-06 – 2019-05-09 (×10): 2 g via INTRAVENOUS
  Filled 2019-05-06 (×14): qty 2

## 2019-05-06 MED ORDER — LIDOCAINE HCL (PF) 1 % IJ SOLN
10.0000 mL | Freq: Once | INTRAMUSCULAR | Status: AC
  Administered 2019-05-06: 05:00:00 5 mL
  Filled 2019-05-06: qty 10

## 2019-05-06 MED ORDER — VANCOMYCIN HCL 750 MG/150ML IV SOLN: 750.0000 mg | INTRAVENOUS | Status: DC

## 2019-05-06 MED ORDER — KETOROLAC TROMETHAMINE 30 MG/ML IJ SOLN
INTRAMUSCULAR | Status: AC
  Administered 2019-05-06: 15 mg via INTRAVENOUS
  Filled 2019-05-06: qty 1

## 2019-05-06 MED ORDER — LORAZEPAM 2 MG/ML IJ SOLN
INTRAMUSCULAR | Status: AC
  Filled 2019-05-06: qty 1

## 2019-05-06 MED ORDER — LIDOCAINE HCL (PF) 1 % IJ SOLN
10.0000 mL | Freq: Once | INTRAMUSCULAR | Status: AC
  Administered 2019-05-06: 05:00:00 10 mL
  Filled 2019-05-06: qty 10

## 2019-05-06 MED ORDER — ACETAMINOPHEN 325 MG PO TABS: 650.0000 mg | ORAL_TABLET | Freq: Four times a day (QID) | ORAL | Status: DC | PRN

## 2019-05-06 MED ORDER — LIDOCAINE-PRILOCAINE 2.5-2.5 % EX CREA
TOPICAL_CREAM | Freq: Once | CUTANEOUS | Status: AC
  Filled 2019-05-06: qty 5

## 2019-05-06 MED ORDER — VANCOMYCIN HCL IN DEXTROSE 1-5 GM/200ML-% IV SOLN
1000.0000 mg | Freq: Once | INTRAVENOUS | Status: AC
  Administered 2019-05-06: 1000 mg via INTRAVENOUS
  Filled 2019-05-06: qty 200

## 2019-05-06 MED ORDER — ENOXAPARIN SODIUM 40 MG/0.4ML ~~LOC~~ SOLN
SUBCUTANEOUS | Status: AC
  Filled 2019-05-06: qty 0.4

## 2019-05-06 MED ORDER — HYDROXYZINE HCL 50 MG/ML IM SOLN
25.0000 mg | Freq: Three times a day (TID) | INTRAMUSCULAR | Status: DC | PRN
  Administered 2019-05-09 – 2019-05-10 (×2): 25 mg via INTRAMUSCULAR
  Filled 2019-05-06 (×4): qty 0.5

## 2019-05-06 MED ORDER — LIDOCAINE-PRILOCAINE 2.5-2.5 % EX CREA
TOPICAL_CREAM | Freq: Once | CUTANEOUS | Status: DC
  Filled 2019-05-06: qty 5

## 2019-05-06 MED ORDER — LORAZEPAM 2 MG/ML IJ SOLN
INTRAMUSCULAR | Status: AC
  Administered 2019-05-06: 14:00:00 1 mg via INTRAVENOUS
  Filled 2019-05-06: qty 1

## 2019-05-06 MED ORDER — SODIUM CHLORIDE 0.9 % IV SOLN: INTRAVENOUS | Status: DC

## 2019-05-06 MED ORDER — LACTATED RINGERS IV BOLUS
1000.0000 mL | Freq: Once | INTRAVENOUS | Status: AC
  Administered 2019-05-06: 04:00:00 1000 mL via INTRAVENOUS

## 2019-05-06 MED ORDER — IBUPROFEN 800 MG PO TABS: 400.0000 mg | ORAL_TABLET | Freq: Four times a day (QID) | ORAL | Status: DC | PRN

## 2019-05-06 MED ORDER — VANCOMYCIN HCL 750 MG/150ML IV SOLN
750.0000 mg | INTRAVENOUS | Status: DC
  Administered 2019-05-07 – 2019-05-08 (×2): 750 mg via INTRAVENOUS
  Filled 2019-05-06 (×3): qty 150

## 2019-05-06 MED ORDER — KETOROLAC TROMETHAMINE 30 MG/ML IJ SOLN: 15.0000 mg | Freq: Three times a day (TID) | INTRAMUSCULAR | Status: DC | PRN

## 2019-05-06 MED ORDER — ENOXAPARIN SODIUM 40 MG/0.4ML ~~LOC~~ SOLN
40.0000 mg | SUBCUTANEOUS | Status: DC
  Administered 2019-05-06: 22:00:00 40 mg via SUBCUTANEOUS

## 2019-05-06 MED ORDER — SODIUM CHLORIDE 0.9 % IV SOLN
1.0000 g | Freq: Once | INTRAVENOUS | Status: AC
  Administered 2019-05-06: 05:00:00 1 g via INTRAVENOUS
  Filled 2019-05-06: qty 1

## 2019-05-06 MED ORDER — LORAZEPAM 2 MG/ML IJ SOLN
1.0000 mg | Freq: Four times a day (QID) | INTRAMUSCULAR | Status: DC | PRN
  Administered 2019-05-06 – 2019-05-08 (×4): 1 mg via INTRAVENOUS

## 2019-05-06 NOTE — H&P (Signed)
History and Physical    Jill Shepherd WNU:[id number] DOB: 12/20/1995 DOA: 05/06/2019  Referring MD/NP/PA:   PCP: Patient, No Pcp Per   Patient coming from:  The patient is coming from home.  At baseline, pt is independent for most of ADL.        Chief Complaint: Abscess  HPI: Jill Shepherd is a 24 y.o. female with medical history significant of anxiety, polysubstance abuse, IV drug use, who presents with multiple abscesses.  Pt states that she has multiple abscesses which has been going on in the past few days, including left axillary abscess, right buttock abscess and bilateral thumb abscess. She has chills, no fever.  She has constant pain, which is sharp, severe, nonradiating.  No chest pain, shortness breath, cough.  She has nausea, but no vomiting, diarrhea or abdominal pain.  No symptoms of UTI or unilateral weakness.  ED Course: pt was found to have WBC 16.9, negative pregnancy test, pending COVID-19 PCR, lactic acid 2.5, electrolytes renal function okay, temperature normal, blood pressure 117/80, tachycardia, tachypnea, oxygen saturation 100% on room air.  Chest x-ray negative.  ED physician did I&D in ED. Pt is admitted to [place] bed as inpatient  Review of Systems:   General: no fevers, has chills, no body weight gain, has poor appetite, has fatigue HEENT: no blurry vision, hearing changes or sore throat Respiratory: no dyspnea, coughing, wheezing CV: no chest pain, no palpitations GI: no nausea, vomiting, abdominal pain, diarrhea, constipation GU: no dysuria, burning on urination, increased urinary frequency, hematuria  Ext: no leg edema Neuro: no unilateral weakness, numbness, or tingling, no vision change or hearing loss Skin: has abscess in left axillary, right buttock and bilateral thumb MSK: No muscle spasm, no deformity, no limitation of range of movement in spin Heme: No easy bruising.  Travel history: No recent long distant travel.  Allergy: No Known  Allergies  Past Medical History:  Diagnosis Date  . Headache   . SAB (spontaneous abortion) 01/19/2014   stillborn at 20.4 wks    No past surgical history on file.  Social History:  reports that she has never smoked. She has never used smokeless tobacco. She reports current drug use. Drug: Other-see comments. She reports that she does not drink alcohol.  Family History:  Family History  Problem Relation Age of Onset  . Hypertension Father   . Hypertension Paternal Grandfather      Prior to Admission medications   Not on File    Physical Exam: Vitals:   05/06/19 0401 05/06/19 0429 05/06/19 0600 05/06/19 0910  BP: 129/86  117/80 125/74  Pulse: 61   (!) 110  Resp: 19  (!) 27 14  Temp:  98.3 F (36.8 C)  (!) 97.5 F (36.4 C)  TempSrc:  Oral    SpO2: 100%   100%  Weight:       General: Not in acute distress HEENT:       Eyes: PERRL, EOMI, no scleral icterus.       ENT: No discharge from the ears and nose, no pharynx injection, no tonsillar enlargement.        Neck: No JVD, no bruit, no mass felt. Heme: No neck lymph node enlargement. Cardiac: S1/S2, RRR, No murmurs, No gallops or rubs. Respiratory: No rales, wheezing, rhonchi or rubs. GI: Soft, nondistended, nontender, no rebound pain, no organomegaly, BS present. GU: No hematuria Ext: No pitting leg edema bilaterally. 2+DP/PT pulse bilaterally. Musculoskeletal: No joint deformities, No joint redness  or warmth, no limitation of ROM in spin. Skin: has abscess in left axillary, right buttock and bilateral thumb Neuro: Alert, oriented X3, cranial nerves II-XII grossly intact, moves all extremities normally Psych: Patient is not psychotic, no suicidal or hemocidal ideation.  Labs on Admission: I have personally reviewed following labs and imaging studies  CBC: Recent Labs  Lab 05/05/19 2258  WBC 16.9*  HGB 13.2  HCT 38.3  MCV 85.7  PLT 378   Basic Metabolic Panel: Recent Labs  Lab 05/05/19 2258  NA 137  K  3.6  CL 102  CO2 22  GLUCOSE 135*  BUN 21*  CREATININE 0.51  CALCIUM 9.4   GFR: Estimated Creatinine Clearance: 63.2 mL/min (by C-G formula based on SCr of 0.51 mg/dL). Liver Function Tests: Recent Labs  Lab 05/05/19 2258  AST 37  ALT 35  ALKPHOS 89  BILITOT 0.7  PROT 7.3  ALBUMIN 4.0   No results for input(s): LIPASE, AMYLASE in the last 168 hours. No results for input(s): AMMONIA in the last 168 hours. Coagulation Profile: No results for input(s): INR, PROTIME in the last 168 hours. Cardiac Enzymes: No results for input(s): CKTOTAL, CKMB, CKMBINDEX, TROPONINI in the last 168 hours. BNP (last 3 results) No results for input(s): PROBNP in the last 8760 hours. HbA1C: No results for input(s): HGBA1C in the last 72 hours. CBG: No results for input(s): GLUCAP in the last 168 hours. Lipid Profile: No results for input(s): CHOL, HDL, LDLCALC, TRIG, CHOLHDL, LDLDIRECT in the last 72 hours. Thyroid Function Tests: No results for input(s): TSH, T4TOTAL, FREET4, T3FREE, THYROIDAB in the last 72 hours. Anemia Panel: No results for input(s): VITAMINB12, FOLATE, FERRITIN, TIBC, IRON, RETICCTPCT in the last 72 hours. Urine analysis:    Component Value Date/Time   COLORURINE STRAW (A) 05/06/2019 0431   APPEARANCEUR CLEAR (A) 05/06/2019 0431   APPEARANCEUR Clear 12/12/2016 1039   LABSPEC 1.004 (L) 05/06/2019 0431   LABSPEC 1.020 12/25/2013 1216   PHURINE 7.0 05/06/2019 0431   GLUCOSEU NEGATIVE 05/06/2019 0431   GLUCOSEU see comment 12/25/2013 1216   HGBUR MODERATE (A) 05/06/2019 0431   BILIRUBINUR NEGATIVE 05/06/2019 0431   BILIRUBINUR neg 07/14/2017 1546   BILIRUBINUR Negative 12/12/2016 1039   BILIRUBINUR see comment 12/25/2013 1216   KETONESUR NEGATIVE 05/06/2019 0431   PROTEINUR NEGATIVE 05/06/2019 0431   UROBILINOGEN 0.2 07/14/2017 1546   NITRITE NEGATIVE 05/06/2019 0431   LEUKOCYTESUR TRACE (A) 05/06/2019 0431   LEUKOCYTESUR see comment 12/25/2013 1216   Sepsis  Labs: '@LABRCNTIP' (procalcitonin:4,lacticidven:4) ) Recent Results (from the past 240 hour(s))  Respiratory Panel by RT PCR (Flu A&B, Covid) - Nasopharyngeal Swab     Status: None   Collection Time: 05/06/19  1:35 PM   Specimen: Nasopharyngeal Swab  Result Value Ref Range Status   SARS Coronavirus 2 by RT PCR NEGATIVE NEGATIVE Final    Comment: (NOTE) SARS-CoV-2 target nucleic acids are NOT DETECTED. The SARS-CoV-2 RNA is generally detectable in upper respiratoy specimens during the acute phase of infection. The lowest concentration of SARS-CoV-2 viral copies this assay can detect is 131 copies/mL. A negative result does not preclude SARS-Cov-2 infection and should not be used as the sole basis for treatment or other patient management decisions. A negative result may occur with  improper specimen collection/handling, submission of specimen other than nasopharyngeal swab, presence of viral mutation(s) within the areas targeted by this assay, and inadequate number of viral copies (<131 copies/mL). A negative result must be combined with clinical observations, patient  history, and epidemiological information. The expected result is Negative. Fact Sheet for Patients:  PinkCheek.be Fact Sheet for Healthcare Providers:  GravelBags.it This test is not yet ap proved or cleared by the [place] FDA and  has been authorized for detection and/or diagnosis of SARS-CoV-2 by FDA under an Emergency Use Authorization (EUA). This EUA will remain  in effect (meaning this test can be used) for the duration of the COVID-19 declaration under Section 564(b)(1) of the Act, 21 U.S.C. section 360bbb-3(b)(1), unless the authorization is terminated or revoked sooner.    Influenza A by PCR NEGATIVE NEGATIVE Final   Influenza B by PCR NEGATIVE NEGATIVE Final    Comment: (NOTE) The Xpert Xpress SARS-CoV-2/FLU/RSV assay is intended as an aid in  the  diagnosis of influenza from Nasopharyngeal swab specimens and  should not be used as a sole basis for treatment. Nasal washings and  aspirates are unacceptable for Xpert Xpress SARS-CoV-2/FLU/RSV  testing. Fact Sheet for Patients: PinkCheek.be Fact Sheet for Healthcare Providers: GravelBags.it This test is not yet approved or cleared by the [place] FDA and  has been authorized for detection and/or diagnosis of SARS-CoV-2 by  FDA under an Emergency Use Authorization (EUA). This EUA will remain  in effect (meaning this test can be used) for the duration of the  Covid-19 declaration under Section 564(b)(1) of the Act, 21  U.S.C. section 360bbb-3(b)(1), unless the authorization is  terminated or revoked. Performed at [facility], [address] [place], [place] [address]      Radiological Exams on Admission: DG Chest [facility] 1 View  Result Date: 05/06/2019 CLINICAL DATA:  Code sepsis EXAM: PORTABLE CHEST 1 VIEW COMPARISON:  One-view chest x-ray 03/26/2019 FINDINGS: The heart size and mediastinal contours are within normal limits. Both lungs are clear. The visualized skeletal structures are unremarkable. IMPRESSION: Negative one-view chest x-ray Electronically Signed   By: San Morelle M.D.   On: 05/06/2019 05:59   DG Finger Thumb Left  Result Date: 05/06/2019 CLINICAL DATA:  Infection of left thumb, history of IV drug use EXAM: LEFT THUMB 2+V COMPARISON:  None. FINDINGS: There is no evidence of fracture or dislocation. No cortical erosion or periosteal reaction. Joint spaces are preserved. No radiopaque foreign body. Focus of sclerosis at the radial aspect of the distal proximal phalanx may reflect a bone island. IMPRESSION: No evidence of osteomyelitis. Electronically Signed   By: Macy Mis M.D.   On: 05/06/2019 14:12   DG Finger Thumb Right  Result Date: 05/06/2019 CLINICAL DATA:  Right thumb infection.  EXAM: RIGHT THUMB 2+V COMPARISON:  No recent. FINDINGS: Soft tissue swelling. No radiopaque foreign body. No acute bony abnormality identified. IMPRESSION: No acute abnormality identified. Electronically Signed   By: Marcello Moores  Register   On: 05/06/2019 11:55     EKG:  Not done in ED, will get one.   Assessment/Plan Principal Problem:   Abscess-multpile sites Active Problems:   History of drug use   Anxiety   Sepsis ([place])   Sepsis due to abscess-multpile sites: pt has abscess in right buttock, left axilla, bilateral thumb paronychia. All wounds were treated with I&D by EDP.  Patient remains critical for sepsis with leukocytosis, tachycardia, tachypnea.  Lactic acid elevated at 2.5, which is normalized with IV fluid.  General surgeon, Dr. Dahlia Byes and orthopedic surgeon, Dr. Mack Guise were consulted.  - will admit to Med-surg bed as inpt - Empiric antimicrobial treatment with vancomycin and cefepime - PRN Zofran for nausea - Pain control: As  needed Tylenol, ibuprofen, IV ketorolac - Blood cultures x 2 -->if positive, will need to get TEE or TTE and consult ID - ESR and CRP - wound care consult - f/u Ortho and general surgeon's recommendations - IVF: 1.0 L of NS bolus in ED, followed by 100 cc/h  History of drug use -prn ativan -did counseling about importance of quitting substance abuse  Anxiety: -As needed hydroxyzine   Inpatient status:  # Patient requires inpatient status due to high intensity of service, high risk for further deterioration and high frequency of surveillance required.  I certify that at the point of admission it is my clinical judgment that the patient will require inpatient hospital care spanning beyond 2 midnights from the point of admission.  . This patient has multiple chronic comorbidities including anxiety, polysubstance abuse, IV drug use . Now patient has presenting with multiple abscesses and sepsis . The worrisome physical exam findings include multiple  abscess . The initial radiographic and laboratory data are worrisome because of leukocytosis, elevated lactic acid . Current medical needs: please see my assessment and plan . Predictability of an adverse outcome (risk): Patient has multiple comorbidities as listed above. Now presents with multiple abscesses and sepsis. Patient's presentation is highly complicated.  Patient is at high risk of deteriorating.  Will need to be treated in hospital for at least 2 days.           DVT ppx:   SQ Lovenox Code Status: Full code Family Communication:     Yes, patient's mother at bed side Disposition Plan:  Anticipate discharge back to previous home environment Consults called:  Dr. Mack Guise of ortho Admission status: Med-surg bed as inpt      Date of Service 05/06/2019    [facility]   If 7PM-7AM, please contact night-coverage [email or web address] 05/06/2019, 3:13 PM
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
## 2019-05-06 NOTE — ED Notes (Signed)
Mom at bedside.

## 2019-05-06 NOTE — ED Notes (Signed)
Pt resting quietly at this time, mother at bedside

## 2019-05-06 NOTE — ED Provider Notes (Signed)
[facility] Emergency Department Provider Note  ____________________________________________  Time seen: Approximately 5:57 AM  I have reviewed the triage vital signs and the nursing notes.   HISTORY  Chief Complaint Abscess   HPI Jill Shepherd is a 24 y.o. female with a history of IV drug use who presents for evaluation of several abscesses.  Patient has had a left axillary abscess, right buttock abscess and bilateral thumb abscess for the last few days.  She has had chills and nausea.  She is not sure if she has had fever.  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  She is requesting help with detox.  She denies chest pain or shortness of breath, she denies abdominal pain.  She is complaining of severe constant throbbing pain in all of her abscesses worse in bilateral thumbs.  Past Medical History:  Diagnosis Date  . Headache   . SAB (spontaneous abortion) 01/19/2014   stillborn at 20.4 wks    Patient Active Problem List   Diagnosis Date Noted  . Anxiety 08/04/2017  . Postpartum depression 08/04/2017  . Labor and delivery indication for care or intervention 07/17/2017  . SVD (spontaneous vaginal delivery) 07/17/2017  . Pregnancy 07/07/2017  . History of preterm delivery 11/28/2016  . Pregnancy complicated by subutex maintenance, antepartum (HCC) 11/28/2016  . History of preterm premature rupture of membranes (PPROM) 09/25/2015  . History of pregnancy loss in prior pregnancy, currently pregnant 05/14/2015  . History of drug use 05/14/2015  . Underweight 05/14/2015    No past surgical history on file.  Prior to Admission medications   Not on File    Allergies Patient has no known allergies.  Family History  Problem Relation Age of Onset  . Hypertension Father   . Hypertension Paternal Grandfather     Social History Social History   Tobacco Use  . Smoking status: Never Smoker  . Smokeless tobacco: Never Used  Substance Use Topics    . Alcohol use: No    Alcohol/week: 0.0 standard drinks  . Drug use: Yes    Types: Other-see comments    Comment: subutex tx    Review of Systems  Constitutional: Negative for fever. Eyes: Negative for visual changes. ENT: Negative for sore throat. Neck: No neck pain  Cardiovascular: Negative for chest pain. Respiratory: Negative for shortness of breath. Gastrointestinal: Negative for abdominal pain, vomiting or diarrhea. Genitourinary: Negative for dysuria. Musculoskeletal: Negative for back pain. Skin: Negative for rash. + R buttock abscess, L axillary abscess, b/l thumb abscess Neurological: Negative for headaches, weakness or numbness. Psych: No SI or HI  ____________________________________________   PHYSICAL EXAM:  VITAL SIGNS: ED Triage Vitals  Enc Vitals Group     BP 05/05/19 2253 120/81     Pulse Rate 05/05/19 2253 100     Resp 05/05/19 2253 20     Temp 05/05/19 2253 98.6 F (37 C)     Temp Source 05/05/19 2253 Oral     SpO2 05/06/19 0401 100 %     Weight 05/05/19 2253 80 lb (36.3 kg)     Height --      Head Circumference --      Peak Flow --      Pain Score 05/05/19 2253 10     Pain Loc --      Pain Edu? --      Excl. in GC? --     Constitutional: Alert and oriented, in no apparent distress, cachectic HEENT:  Head: Normocephalic and atraumatic.         Eyes: Conjunctivae are normal. Sclera is non-icteric.       Mouth/Throat: Mucous membranes are moist.       Neck: Supple with no signs of meningismus. Cardiovascular: Tachycardic with regular rhythm but no murmurs Respiratory: Normal respiratory effort. Lungs are clear to auscultation bilaterally. No wheezes, crackles, or rhonchi.  Gastrointestinal: Soft, non tender Musculoskeletal: There is a large 8cm abscess with overlying erythema and warmth on the R buttock, a 4cm abscess on the L axilla, and bilateral thumb paronichias Neurologic: Normal speech and language. Face is symmetric. Moving all  extremities. No gross focal neurologic deficits are appreciated. Skin: Skin is warm, dry and intact. No rash noted. Psychiatric: Mood and affect are normal. Speech and behavior are normal.  ____________________________________________   LABS (all labs ordered are listed, but only abnormal results are displayed)  Labs Reviewed  CBC - Abnormal; Notable for the following components:      Result Value   WBC 16.9 (*)    All other components within normal limits  COMPREHENSIVE METABOLIC PANEL - Abnormal; Notable for the following components:   Glucose, Bld 135 (*)    BUN 21 (*)    All other components within normal limits  LACTIC ACID, PLASMA - Abnormal; Notable for the following components:   Lactic Acid, Venous 2.5 (*)    All other components within normal limits  CULTURE, BLOOD (ROUTINE X 2)  CULTURE, BLOOD (ROUTINE X 2)  URINE CULTURE  LACTIC ACID, PLASMA  PROCALCITONIN  URINALYSIS, COMPLETE (UACMP) WITH MICROSCOPIC  POCT PREGNANCY, URINE  POC URINE PREG, ED   ____________________________________________  EKG  none  ____________________________________________  RADIOLOGY  I have personally reviewed the images performed during this visit and I agree with the Radiologist's read.   Interpretation by Radiologist:  DG Chest Port 1 View  Result Date: 05/06/2019 CLINICAL DATA:  Code sepsis EXAM: PORTABLE CHEST 1 VIEW COMPARISON:  One-view chest x-ray 03/26/2019 FINDINGS: The heart size and mediastinal contours are within normal limits. Both lungs are clear. The visualized skeletal structures are unremarkable. IMPRESSION: Negative one-view chest x-ray Electronically Signed   By: Marin Roberts M.D.   On: 05/06/2019 05:59      ____________________________________________   PROCEDURES  Procedure(s) performed:yes .Marland KitchenIncision and Drainage  Date/Time: 05/06/2019 6:02 AM Performed by: Nita Sickle, MD Authorized by: Nita Sickle, MD   Consent:    Consent  obtained:  Verbal   Consent given by:  Patient   Risks discussed:  Bleeding, infection, incomplete drainage and pain   Alternatives discussed:  Alternative treatment, delayed treatment and observation Location:    Type:  Abscess   Size:  8   Location:  Anogenital   Anogenital location: R buttock. Pre-procedure details:    Skin preparation:  Antiseptic wash and Betadine Anesthesia (see MAR for exact dosages):    Anesthesia method:  Local infiltration and topical application   Topical anesthetic:  EMLA cream   Local anesthetic:  Lidocaine 1% w/o epi Procedure type:    Complexity:  Complex Procedure details:    Incision types:  Stab incision   Scalpel blade:  11   Wound management:  Probed and deloculated   Drainage:  Purulent   Drainage amount:  Copious   Wound treatment:  Wound left open   Packing materials:  None Post-procedure details:    Patient tolerance of procedure:  Tolerated well, no immediate complications .Marland KitchenIncision and Drainage  Date/Time: 05/06/2019 6:03 AM  Performed by: Nita Sickle, MD Authorized by: Nita Sickle, MD   Consent:    Consent obtained:  Verbal   Consent given by:  Patient   Risks discussed:  Bleeding, infection, incomplete drainage and pain   Alternatives discussed:  Alternative treatment, delayed treatment and observation Location:    Type:  Abscess   Size:  4   Location:  Upper extremity (left axilla) Pre-procedure details:    Skin preparation:  Betadine Anesthesia (see MAR for exact dosages):    Anesthesia method:  Local infiltration and topical application   Topical anesthetic:  EMLA cream   Local anesthetic:  Lidocaine 1% w/o epi Procedure type:    Complexity:  Complex Procedure details:    Needle aspiration: no     Incision types:  Stab incision   Scalpel blade:  11   Wound management:  Probed and deloculated and irrigated with saline   Drainage:  Purulent   Drainage amount:  Copious   Wound treatment:  Wound left  open Post-procedure details:    Patient tolerance of procedure:  Tolerated well, no immediate complications Drain paronychia  Date/Time: 05/06/2019 6:05 AM Performed by: Nita Sickle, MD Authorized by: Nita Sickle, MD  Consent: Verbal consent obtained. Risks and benefits: risks, benefits and alternatives were discussed Consent given by: patient Patient understanding: patient states understanding of the procedure being performed Patient identity confirmed: verbally with patient Preparation: Patient was prepped and draped in the usual sterile fashion. Local anesthesia used: yes Anesthesia: digital block  Anesthesia: Local anesthesia used: yes Local Anesthetic: lidocaine 1% without epinephrine Anesthetic total: 5 mL  Sedation: Patient sedated: no  Patient tolerance: patient tolerated the procedure well with no immediate complications Comments: Right thumb  Drain paronychia  Date/Time: 05/06/2019 6:05 AM Performed by: Nita Sickle, MD Authorized by: Nita Sickle, MD  Consent: Verbal consent obtained. Risks and benefits: risks, benefits and alternatives were discussed Consent given by: patient Patient identity confirmed: verbally with patient Preparation: Patient was prepped and draped in the usual sterile fashion. Local anesthesia used: yes Anesthesia: digital block  Anesthesia: Local anesthesia used: yes Local Anesthetic: lidocaine 1% without epinephrine Anesthetic total: 5 mL  Sedation: Patient sedated: no  Patient tolerance: patient tolerated the procedure well with no immediate complications Comments: Left thumb    Critical Care performed: yes  CRITICAL CARE Performed by: Nita Sickle  ?  Total critical care time: 35 min  Critical care time was exclusive of separately billable procedures and treating other patients.  Critical care was necessary to treat or prevent imminent or life-threatening deterioration.  Critical care  was time spent personally by me on the following activities: development of treatment plan with patient and/or surrogate as well as nursing, discussions with consultants, evaluation of patient's response to treatment, examination of patient, obtaining history from patient or surrogate, ordering and performing treatments and interventions, ordering and review of laboratory studies, ordering and review of radiographic studies, pulse oximetry and re-evaluation of patient's condition.  ____________________________________________   INITIAL IMPRESSION / ASSESSMENT AND PLAN / ED COURSE  24 y.o. female with a history of IV drug use who presents for evaluation of several abscesses.  Patient is afebrile but tachycardic, has several abscesses located in her left axilla, right buttock, and bilateral paronychias.  Labs were reviewed showing leukocytosis and lactic acidosis consistent with sepsis.  Possibly bacteremia in the setting of IV drug use.  Abscesses were drained per procedure note above.  Patient was given cefepime and vancomycin.  Blood cultures are  pending.  After fluid resuscitation patient's lactic acidosis has resolved.  Patient is requesting help with detox.  Prior records have been reviewed showing several prior visits this for polysubstance abuse and overdoses.  Patient denies SI or HI and therefore does not meet criteria for IVC.  Discussed with hospitalist for admission.      _____________________________________________ Please note:  Patient was evaluated in Emergency Department today for the symptoms described in the history of present illness. Patient was evaluated in the context of the global COVID-19 pandemic, which necessitated consideration that the patient might be at risk for infection with the SARS-CoV-2 virus that causes COVID-19. Institutional protocols and algorithms that pertain to the evaluation of patients at risk for COVID-19 are in a state of rapid change based on information  released by regulatory bodies including the CDC and federal and state organizations. These policies and algorithms were followed during the patient's care in the ED.  Some ED evaluations and interventions may be delayed as a result of limited staffing during the pandemic.   [place] Controlled Substance Database was reviewed by me. ____________________________________________   FINAL CLINICAL IMPRESSION(S) / ED DIAGNOSES   Final diagnoses:  Sepsis without acute organ dysfunction, due to unspecified organism ([place])  Abscess  Paronychia of thumb, unspecified laterality      NEW MEDICATIONS STARTED DURING THIS VISIT:  ED Discharge Orders    None       Note:  This document was prepared using Dragon voice recognition software and may include unintentional dictation errors.    Alfred Levins, [place], MD 05/06/19 [id number]

## 2019-05-06 NOTE — ED Provider Notes (Addendum)
Procedures     ----------------------------------------- 8:44 AM on 05/06/2019 -----------------------------------------   Right thumb swollen, inflamed. Dr. Don Perking already drained paronychia from this area.  Hospitalist requests ortho consult to see if this needs transfer to hospital with hand surgery.    ----------------------------------------- 10:43 AM on 05/06/2019 -----------------------------------------  Discussed with Dr. Martha Clan, okay to be treated at [facility].          Sharman Cheek, MD 05/06/19 Mora Bellman    Sharman Cheek, MD 05/06/19 1044

## 2019-05-06 NOTE — Progress Notes (Signed)
Pt. Care released to [facility] and pt. In NAD with VSS.

## 2019-05-06 NOTE — Consult Note (Signed)
Patient ID: Jill Shepherd, female   DOB: [birth year], 24 y.o.   MRN: [id number]  HPI Jill Shepherd is a 24 y.o. female seen in consultation at the request of Dr.Niu for a left axillary abscess and a right buttocks abscess.  He reports he developed more chills multiple infections over the last few days and reports some intermittent pain on the left axilla and right buttocks.  The pain is moderate intensity and sharp in nature.  It is worsened when she applies pressure on the areas.  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  She denies any fevers but she reports some chills.  She also reports significant pain bilateral thumbs and fingers.  Coming to the emergency room with an increased lactate level and some decreased mentation.  Currently she is fully awake and alert.  She is eating crackers and she is hungry.  CBC was normal except a white count of 16.9.  CMP is completely normal.  She did have a urine tox screen revealing amphetamines.  I personally reviewed her chest x-ray which reveals no active disease.  HPI  Past Medical History:  Diagnosis Date  . Headache   . SAB (spontaneous abortion) 01/19/2014   stillborn at 20.4 wks    No past surgical history on file.  Family History  Problem Relation Age of Onset  . Hypertension Father   . Hypertension Paternal Grandfather     Social History Social History   Tobacco Use  . Smoking status: Never Smoker  . Smokeless tobacco: Never Used  Substance Use Topics  . Alcohol use: No    Alcohol/week: 0.0 standard drinks  . Drug use: Yes    Types: Other-see comments    Comment: subutex tx    No Known Allergies  Current Facility-Administered Medications  Medication Dose Route Frequency Provider Last Rate Last Admin  . 0.9 %  sodium chloride infusion   Intravenous Continuous Lorretta Harp, MD 100 mL/hr at 05/06/19 1340 New Bag at 05/06/19 1340  . acetaminophen (TYLENOL) tablet 650 mg  650 mg Oral Q6H PRN Lorretta Harp, MD      . ceFEPIme  (MAXIPIME) 2 g in sodium chloride 0.9 % 100 mL IVPB  2 g Intravenous Q8H Ronnald Ramp, RPH 200 mL/hr at 05/06/19 1401 2 g at 05/06/19 1401  . enoxaparin (LOVENOX) injection 40 mg  40 mg Subcutaneous Q24H Lorretta Harp, MD      . hydrOXYzine (VISTARIL) injection 25 mg  25 mg Intramuscular Q8H PRN Lorretta Harp, MD      . ibuprofen (ADVIL) tablet 400 mg  400 mg Oral Q6H PRN Lorretta Harp, MD      . ketorolac (TORADOL) 30 MG/ML injection 15 mg  15 mg Intravenous Q8H PRN Lorretta Harp, MD      . lidocaine-prilocaine (EMLA) cream   Topical Once Lorretta Harp, MD      . LORazepam (ATIVAN) injection 1 mg  1 mg Intravenous Q6H PRN Lorretta Harp, MD   1 mg at 05/06/19 1339  . ondansetron (ZOFRAN) injection 4 mg  4 mg Intravenous Q8H PRN Lorretta Harp, MD      . Melene Muller ON 05/07/2019] vancomycin (VANCOREADY) IVPB 750 mg/150 mL  750 mg Intravenous Q24H Ronnald Ramp, [facility]         Review of Systems Full ROS  was asked and was negative except for the information on the HPI  Physical Exam Blood pressure 125/74, pulse (!) 110, temperature (!) 97.5 F (36.4 C), resp.  rate 14, weight 36.3 kg, SpO2 100 %, currently breastfeeding. CONSTITUTIONAL: NAD EYES: Pupils are equal, round, , Sclera are non-icteric. EARS, NOSE, MOUTH AND THROAT: Wearing a mask. Hearing is intact to voice. LYMPH NODES:  Lymph nodes in the neck are normal. RESPIRATORY:  Lungs are clear. There is normal respiratory effort, with equal breath sounds bilaterally, and without pathologic use of accessory muscles. CARDIOVASCULAR: Heart is regular without murmurs, gallops, or rubs. GI: The abdomen is soft, nontender, and nondistended. There are no palpable masses. There is no hepatosplenomegaly. There are normal bowel sounds in all quadrants.  MUSCULOSKELETAL: Normal muscle strength and tone. No cyanosis or edema.   SKIN: Does have evidence of an axillary abscess measuring 2 x 2 cm is very tender to palpation and there is significant erythema fluctuance.  There is  another already drained collection on the right buttocks without any fluctuance there is some induration and cellulitis.   NEUROLOGIC: Motor and sensation is grossly normal. Cranial nerves are grossly intact. PSYCH:  Oriented to person, place and time. Affect is normal.  Data Reviewed  I have personally reviewed the patient's imaging, laboratory findings and medical records.    Assessment/Plan 24 year old female with history of IV drug abuse now presents with multiple abscesses to include left axillary abscess and right buttocks abscess.  The right buttocks abscess has already draining spontaneously and there is no evidence of fluctuance however the left axillary abscess has fluctuance and required I&D.  She had some crackers today and we will post her for tomorrow.  She discussed with the patient in detail.  Risk benefit and possible complications including but not limited to: Bleeding, infection chronic pain wound healing issues.  She understands and wishes to proceed.     Caroleen Hamman, MD FACS General Surgeon 05/06/2019, 3:41 PM

## 2019-05-06 NOTE — ED Notes (Signed)
Pt screaming out random thoughts speaking about her dead baby who mother states died 3 years ago. Dr. Clyde Lundborg informed, ativan IV ordered

## 2019-05-06 NOTE — ED Notes (Signed)
Abscesses to bilat thumbs, left axilla, and right buttocks drained by Dr. Don Perking. Dressing applied to wounds. IV pulled during procedure, pt restless and not following commands well. IV restarted antibiotics infusing.

## 2019-05-06 NOTE — Progress Notes (Signed)
Attempted to change buttock dressing. Pt. Refused and became angry and thrashing around. Pt. Calmed and dressing procedure stopped. Previous dressing remain intact.

## 2019-05-06 NOTE — Consult Note (Signed)
ORTHOPAEDIC CONSULTATION  REQUESTING PHYSICIAN: Lorretta Harp, MD  Chief Complaint: Bilateral thumb cellulitis versus abscess  HPI: Jill Shepherd is a 24 y.o. female with a history of IV drug use, including heroin and meth amphetamine, presents to the ED today for multiple abscesses.  Patient is seen in the preop holding area due to no for beds available.  Her mother is at the bedside.  Patient is sleeping prone and is difficult to arouse.  Patient is unable to provide an accurate history.  Patient raised her head up off the bed but otherwise would not speak.  She is responding to painful stimuli.  Orthopedics is consulted for evaluation of bilateral thumb swelling and erythema.  Past Medical History:  Diagnosis Date  . Headache   . SAB (spontaneous abortion) 01/19/2014   stillborn at 20.4 wks   No past surgical history on file. Social History   Socioeconomic History  . Marital status: Single    Spouse name: Not on file  . Number of children: Not on file  . Years of education: Not on file  . Highest education level: Not on file  Occupational History    Employer: [employer]  Tobacco Use  . Smoking status: Never Smoker  . Smokeless tobacco: Never Used  Substance and Sexual Activity  . Alcohol use: No    Alcohol/week: 0.0 standard drinks  . Drug use: Yes    Types: Other-see comments    Comment: subutex tx  . Sexual activity: Not Currently    Partners: Male    Birth control/protection: None  Other Topics Concern  . Not on file  Social History Narrative  . Not on file   Social Determinants of Health   Financial Resource Strain:   . Difficulty of Paying Living Expenses:   Food Insecurity:   . Worried About Programme researcher, broadcasting/film/video in the Last Year:   . Barista in the Last Year:   Transportation Needs:   . Freight forwarder (Medical):   Marland Kitchen Lack of Transportation (Non-Medical):   Physical Activity:   . Days of Exercise per Week:   . Minutes of Exercise per  Session:   Stress:   . Feeling of Stress :   Social Connections:   . Frequency of Communication with Friends and Family:   . Frequency of Social Gatherings with Friends and Family:   . Attends Religious Services:   . Active Member of Clubs or Organizations:   . Attends Banker Meetings:   Marland Kitchen Marital Status:    Family History  Problem Relation Age of Onset  . Hypertension Father   . Hypertension Paternal Grandfather    No Known Allergies Prior to Admission medications   Not on File   DG Chest Port 1 View  Result Date: 05/06/2019 CLINICAL DATA:  Code sepsis EXAM: PORTABLE CHEST 1 VIEW COMPARISON:  One-view chest x-ray 03/26/2019 FINDINGS: The heart size and mediastinal contours are within normal limits. Both lungs are clear. The visualized skeletal structures are unremarkable. IMPRESSION: Negative one-view chest x-ray Electronically Signed   By: Marin Roberts M.D.   On: 05/06/2019 05:59   DG Finger Thumb Right  Result Date: 05/06/2019 CLINICAL DATA:  Right thumb infection. EXAM: RIGHT THUMB 2+V COMPARISON:  No recent. FINDINGS: Soft tissue swelling. No radiopaque foreign body. No acute bony abnormality identified. IMPRESSION: No acute abnormality identified. Electronically Signed   By: Maisie Fus  Register   On: 05/06/2019 11:55    Positive ROS: All  other systems have been reviewed and were otherwise negative with the exception of those mentioned in the HPI and as above.  Physical Exam: General: Alert, no acute distress  MUSCULOSKELETAL: Bilateral thumbs: Patient has swelling over the distal phalanx of both thumbs.  There is mild purulent drainage along the nailbed of both thumbs.  Patient has tenderness to palpation of the thumbs.  The pulp of the thumb is swollen but compressible.  She does not have significant pain with passive motion of the IP joint of either thumb.  Her thumbs are well-perfused.  Patient will not communicate and therefore it is difficult to assess  her active motion and sensation.  Assessment: Bilateral thumb erythema and swelling consistent with cellulitis versus abscess  Plan: I am ordering bilateral thumb x-rays to evaluate for osteomyelitis.  Patient is being admitted to the hospital service for IV antibiotics.  I will continue to follow the patient and determine her response to IV antibiotics.  If she does not prove on IV antibiotics further evaluation with an MRI of her thumbs may be necessary to confirm if an abscess is present that would require surgical drainage.  Abscesses of the axilla and buttocks may need evaluation by general surgery.    Thornton Park, MD    05/06/2019 1:39 PM

## 2019-05-06 NOTE — Consult Note (Signed)
Pharmacy Antibiotic Note  Jill Shepherd is a 24 y.o. female admitted on 05/06/2019 with sepsis and hx of IVDU, multiple abscess.  Pharmacy has been consulted for cefepime and vancomycin  dosing.  Plan: Cefepime 2g q8H   Vancomycin 1000 mg x 1 followed by 750 mg daily with a predicted AUC of 504. Goal AUC of 400-550. Scr used 0.8 and used TBW as TBW < IBW. Plan to get level in 4-5 days.   Weight: 36.3 kg (80 lb)  Temp (24hrs), Avg:98.1 F (36.7 C), Min:97.5 F (36.4 C), Max:98.6 F (37 C)  Recent Labs  Lab 05/05/19 2258 05/06/19 0508  WBC 16.9*  --   CREATININE 0.51  --   LATICACIDVEN 2.5* 0.7    Estimated Creatinine Clearance: 63.2 mL/min (by C-G formula based on SCr of 0.51 mg/dL).    No Known Allergies  Antimicrobials this admission: 4/9 vancomycin >>  4/9 cefepime >>   Dose adjustments this admission: None  Microbiology results: 4/9 BCx: pending 4/9 UCx: pending   Thank you for allowing pharmacy to be a part of this patient's care.  Ronnald Ramp, PharmD, BCPS 05/06/2019 11:29 AM

## 2019-05-06 NOTE — Progress Notes (Signed)
CODE SEPSIS - PHARMACY COMMUNICATION  **Broad Spectrum Antibiotics should be administered within 1 hour of Sepsis diagnosis**  Time Code Sepsis Called/Page Received: 8676  Antibiotics Ordered: vanc/cefepime  Time of 1st antibiotic administration: 0510  Additional action taken by pharmacy:   If necessary, Name of Provider/Nurse Contacted:     Thomasene Ripple ,PharmD Clinical Pharmacist  05/06/2019  5:49 AM

## 2019-05-07 ENCOUNTER — Encounter
Admission: EM | Disposition: A | Discharge status: Home / Self Care | Payer: Self-pay | Source: Home / Self Care | Attending: Internal Medicine

## 2019-05-07 ENCOUNTER — Inpatient Hospital Stay: Payer: Self-pay | Admitting: Anesthesiology

## 2019-05-07 ENCOUNTER — Encounter: Payer: Self-pay | Admitting: Internal Medicine

## 2019-05-07 DIAGNOSIS — F1510 Other stimulant abuse, uncomplicated: Secondary | ICD-10-CM | POA: Diagnosis present

## 2019-05-07 DIAGNOSIS — F1110 Opioid abuse, uncomplicated: Secondary | ICD-10-CM

## 2019-05-07 HISTORY — PX: INCISION AND DRAINAGE ABSCESS: SHX5864

## 2019-05-07 LAB — BASIC METABOLIC PANEL
Anion gap: 4 — ABNORMAL LOW (ref 5–15)
BUN: 12 mg/dL (ref 6–20)
CO2: 22 mmol/L (ref 22–32)
Calcium: 8.3 mg/dL — ABNORMAL LOW (ref 8.9–10.3)
Chloride: 111 mmol/L (ref 98–111)
Creatinine, Ser: 0.44 mg/dL (ref 0.44–1.00)
GFR calc Af Amer: >60 mL/min (ref >60)
GFR calc non Af Amer: >60 mL/min (ref >60)
Glucose, Bld: 100 mg/dL — ABNORMAL HIGH (ref 70–99)
Potassium: 4 mmol/L (ref 3.5–5.1)
Sodium: 137 mmol/L (ref 135–145)

## 2019-05-07 LAB — CBC
HCT: 34.8 % — ABNORMAL LOW (ref 36.0–46.0)
Hemoglobin: 11.6 g/dL — ABNORMAL LOW (ref 12.0–15.0)
MCH: 29.3 pg (ref 26.0–34.0)
MCHC: 33.3 g/dL (ref 30.0–36.0)
MCV: 87.9 fL (ref 80.0–100.0)
Platelets: 280 10*3/uL (ref 150–400)
RBC: 3.96 MIL/uL (ref 3.87–5.11)
RDW: 14.6 % (ref 11.5–15.5)
WBC: 7.3 10*3/uL (ref 4.0–10.5)
nRBC: 0 % (ref 0.0–0.2)

## 2019-05-07 LAB — PROCALCITONIN: Procalcitonin: <0.1 ng/mL

## 2019-05-07 LAB — URINE CULTURE: Culture: NO GROWTH

## 2019-05-07 LAB — HIV ANTIBODY (ROUTINE TESTING W REFLEX): HIV Screen 4th Generation wRfx: NONREACTIVE

## 2019-05-07 SURGERY — INCISION AND DRAINAGE, ABSCESS
Anesthesia: General | Site: Axilla | Laterality: Left

## 2019-05-07 MED ORDER — DIPHENHYDRAMINE HCL 50 MG/ML IJ SOLN
INTRAMUSCULAR | Status: DC | PRN
  Administered 2019-05-07: 6.25 mg via INTRAVENOUS

## 2019-05-07 MED ORDER — KETOROLAC TROMETHAMINE 30 MG/ML IJ SOLN
INTRAMUSCULAR | Status: AC
  Administered 2019-05-07: 30 mg via INTRAVENOUS
  Filled 2019-05-07: qty 1

## 2019-05-07 MED ORDER — ONDANSETRON HCL 4 MG/2ML IJ SOLN
INTRAMUSCULAR | Status: AC
  Filled 2019-05-07: qty 2

## 2019-05-07 MED ORDER — LIDOCAINE HCL (CARDIAC) PF 100 MG/5ML IV SOSY
PREFILLED_SYRINGE | INTRAVENOUS | Status: DC | PRN
  Administered 2019-05-07: 60 mg via INTRAVENOUS

## 2019-05-07 MED ORDER — SODIUM CHLORIDE FLUSH 0.9 % IV SOLN
INTRAVENOUS | Status: AC
  Filled 2019-05-07: qty 10

## 2019-05-07 MED ORDER — BUPIVACAINE HCL (PF) 0.25 % IJ SOLN
INTRAMUSCULAR | Status: AC
  Filled 2019-05-07: qty 30

## 2019-05-07 MED ORDER — FENTANYL CITRATE (PF) 100 MCG/2ML IJ SOLN
INTRAMUSCULAR | Status: AC
  Filled 2019-05-07: qty 2

## 2019-05-07 MED ORDER — PROPOFOL 10 MG/ML IV BOLUS
INTRAVENOUS | Status: DC | PRN
  Administered 2019-05-07: 30 mg via INTRAVENOUS
  Administered 2019-05-07: 100 mg via INTRAVENOUS

## 2019-05-07 MED ORDER — KETOROLAC TROMETHAMINE 30 MG/ML IJ SOLN: 30.0000 mg | Freq: Once | INTRAMUSCULAR | Status: AC

## 2019-05-07 MED ORDER — DIPHENHYDRAMINE HCL 50 MG/ML IJ SOLN
INTRAMUSCULAR | Status: AC
  Filled 2019-05-07: qty 1

## 2019-05-07 MED ORDER — OXYCODONE HCL 5 MG/5ML PO SOLN: 5.0000 mg | Freq: Once | ORAL | Status: DC | PRN

## 2019-05-07 MED ORDER — QUETIAPINE FUMARATE 100 MG PO TABS
100.0000 mg | ORAL_TABLET | Freq: Every day | ORAL | Status: DC
  Administered 2019-05-07 – 2019-05-08 (×2): 100 mg via ORAL
  Filled 2019-05-07 (×3): qty 1

## 2019-05-07 MED ORDER — MIDAZOLAM HCL 2 MG/2ML IJ SOLN
INTRAMUSCULAR | Status: DC | PRN
  Administered 2019-05-07: 2 mg via INTRAVENOUS

## 2019-05-07 MED ORDER — KETOROLAC TROMETHAMINE 30 MG/ML IJ SOLN: 30.0000 mg | Freq: Four times a day (QID) | INTRAMUSCULAR | Status: DC | PRN

## 2019-05-07 MED ORDER — OXYCODONE HCL 5 MG PO TABS: 5.0000 mg | ORAL_TABLET | Freq: Once | ORAL | Status: DC | PRN

## 2019-05-07 MED ORDER — KETOROLAC TROMETHAMINE 15 MG/ML IJ SOLN
INTRAMUSCULAR | Status: AC
  Administered 2019-05-07: 15 mg
  Filled 2019-05-07: qty 1

## 2019-05-07 MED ORDER — KETAMINE HCL 50 MG/ML IJ SOLN
INTRAMUSCULAR | Status: DC | PRN
  Administered 2019-05-07: 25 mg via INTRAMUSCULAR

## 2019-05-07 MED ORDER — ENOXAPARIN SODIUM 30 MG/0.3ML ~~LOC~~ SOLN
30.0000 mg | SUBCUTANEOUS | Status: DC
  Administered 2019-05-07 – 2019-05-09 (×3): 30 mg via SUBCUTANEOUS
  Filled 2019-05-07 (×4): qty 0.3

## 2019-05-07 MED ORDER — LACTATED RINGERS IV SOLN: INTRAVENOUS | Status: DC | PRN

## 2019-05-07 MED ORDER — FENTANYL CITRATE (PF) 100 MCG/2ML IJ SOLN: 25.0000 ug | INTRAMUSCULAR | Status: DC | PRN

## 2019-05-07 MED ORDER — MIDAZOLAM HCL 2 MG/2ML IJ SOLN
INTRAMUSCULAR | Status: AC
  Filled 2019-05-07: qty 2

## 2019-05-07 MED ORDER — BUPIVACAINE-EPINEPHRINE 0.25% -1:200000 IJ SOLN
INTRAMUSCULAR | Status: DC | PRN
  Administered 2019-05-07: 10 mL

## 2019-05-07 MED ORDER — EPINEPHRINE PF 1 MG/ML IJ SOLN
INTRAMUSCULAR | Status: AC
  Filled 2019-05-07: qty 1

## 2019-05-07 MED ORDER — FENTANYL CITRATE (PF) 100 MCG/2ML IJ SOLN
INTRAMUSCULAR | Status: DC | PRN
  Administered 2019-05-07 (×3): 25 ug via INTRAVENOUS

## 2019-05-07 MED ORDER — ONDANSETRON HCL 4 MG/2ML IJ SOLN: 4.0000 mg | Freq: Once | INTRAMUSCULAR | Status: DC | PRN

## 2019-05-07 MED ORDER — KETAMINE HCL 50 MG/ML IJ SOLN
INTRAMUSCULAR | Status: AC
  Filled 2019-05-07: qty 10

## 2019-05-07 SURGICAL SUPPLY — 22 items
BLADE CLIPPER SURG (BLADE) ×3 IMPLANT
BLADE SURG 15 STRL LF DISP TIS (BLADE) ×1 IMPLANT
BLADE SURG 15 STRL SS (BLADE) ×2
BRUSH SCRUB EZ  4% CHG (MISCELLANEOUS) ×2
BRUSH SCRUB EZ 4% CHG (MISCELLANEOUS) ×1 IMPLANT
CANISTER SUCT 3000ML PPV (MISCELLANEOUS) ×3 IMPLANT
COVER WAND RF STERILE (DRAPES) ×3 IMPLANT
DRAPE LAPAROTOMY 77X122 PED (DRAPES) ×3 IMPLANT
ELECT REM PT RETURN 9FT ADLT (ELECTROSURGICAL) ×3
ELECTRODE REM PT RTRN 9FT ADLT (ELECTROSURGICAL) ×1 IMPLANT
GAUZE PACKING IODOFORM 1X5 (MISCELLANEOUS) ×3 IMPLANT
GLOVE BIO SURGEON STRL SZ7 (GLOVE) ×3 IMPLANT
GOWN STRL REUS W/ TWL LRG LVL3 (GOWN DISPOSABLE) ×2 IMPLANT
GOWN STRL REUS W/TWL LRG LVL3 (GOWN DISPOSABLE) ×4
NEEDLE HYPO 22GX1.5 SAFETY (NEEDLE) ×3 IMPLANT
NS IRRIG 1000ML POUR BTL (IV SOLUTION) ×3 IMPLANT
PACK BASIN MINOR ARMC (MISCELLANEOUS) ×3 IMPLANT
SOL PREP PVP 2OZ (MISCELLANEOUS) ×3
SOLUTION PREP PVP 2OZ (MISCELLANEOUS) ×1 IMPLANT
SPONGE LAP 18X18 RF (DISPOSABLE) ×3 IMPLANT
SWAB CULTURE AMIES ANAERIB BLU (MISCELLANEOUS) ×3 IMPLANT
SYR BULB IRRIG 60ML STRL (SYRINGE) ×3 IMPLANT

## 2019-05-07 NOTE — Op Note (Signed)
  05/07/2019  9:48 AM  PATIENT:  Jill Shepherd  24 y.o. female  PRE-OPERATIVE DIAGNOSIS:  Left Axillary abscess  POST-OPERATIVE DIAGNOSIS:  Same  PROCEDURE:   1. Incision and drainage of complex axillary abscess 2. Excisional debridement of skin subcutaneous tissue measuring 4 square centimeters    SURGEON:  Surgeon(s) and Role:    * Lile Mccurley F, MD - Primary   ANESTHESIA: General  INDICATIONS FOR PROCEDURE Left axillary abscess  DICTATION:  Patient was explained about the procedure in detail. Risks, benefits possible complications and a consent was obtained. The patient taken to the operating room and placed in the supine position.  Elliptical incision created. Pus was drained about 3cc, cultures obtained. Excisional debridement performed with capture. Good hemostasis achieved.  Irrigation with normal saline and the wound was packed with 1/4 inch packing. Marcaine quarter percent with epinephrine was injected around the wound site. Needle and laparotomy counts were correct and there were no immediate complications   Leafy Ro, MD

## 2019-05-07 NOTE — Progress Notes (Signed)
Subjective:  Patient status post I&D of axillary abscess by Dr. Everlene Farrier earlier today.  Orthopedics is following for bilateral thumb erythema and swelling.  Patient moaning but unable to provide an accurate history.  Patient underwent I&D of both thumbs by the emergency room attending yesterday.  Objective:   VITALS:   Vitals:   05/07/19 0952 05/07/19 0955 05/07/19 1010 05/07/19 1025  BP: (!) 100/57 (!) 103/57 103/62 107/65  Pulse:  89 84 83  Resp: 14 16 20  (!) 22  Temp: 97.6 F (36.4 C)     TempSrc:      SpO2: 99% 99% 100% 99%  Weight:      Height:        PHYSICAL EXAM: Right thumb: Patient has swelling and erythema over the pulp of the right thumb.  Minimal erythematous extension over the volar IP joint.  The nail plate is intact.  The thumb is perfused.  Patient has tenderness to palpation of the pulp but does not exhibit pain with passive motion of the IP joint.  There is no significant active drainage.  Left thumb: Patient's erythema and swelling have improved.  There is no significant active drainage.  The thumb is well-perfused.  She has mild tenderness to palpation and no pain with passive motion of the IP joint.  Nail plate is intact.   LABS  Results for orders placed or performed during the hospital encounter of 05/06/19 (from the past 24 hour(s))  Sedimentation rate     Status: None   Collection Time: 05/06/19  3:51 PM  Result Value Ref Range   Sed Rate 14 0 - 20 mm/hr  C-reactive protein     Status: Abnormal   Collection Time: 05/06/19  3:51 PM  Result Value Ref Range   CRP 2.9 (H) <1.0 mg/dL  Procalcitonin     Status: None   Collection Time: 05/07/19  7:20 AM  Result Value Ref Range   Procalcitonin <0.10 ng/mL  Basic metabolic panel     Status: Abnormal   Collection Time: 05/07/19  7:20 AM  Result Value Ref Range   Sodium 137 135 - 145 mmol/L   Potassium 4.0 3.5 - 5.1 mmol/L   Chloride 111 98 - 111 mmol/L   CO2 22 22 - 32 mmol/L   Glucose, Bld 100 (H) 70 -  99 mg/dL   BUN 12 6 - 20 mg/dL   Creatinine, Ser 07/07/19 0.44 - 1.00 mg/dL   Calcium 8.3 (L) 8.9 - 10.3 mg/dL   GFR calc non Af Amer >60 >60 mL/min   GFR calc Af Amer >60 >60 mL/min   Anion gap 4 (L) 5 - 15  CBC     Status: Abnormal   Collection Time: 05/07/19  7:20 AM  Result Value Ref Range   WBC 7.3 4.0 - 10.5 K/uL   RBC 3.96 3.87 - 5.11 MIL/uL   Hemoglobin 11.6 (L) 12.0 - 15.0 g/dL   HCT 07/07/19 (L) 31.5 - 40.0 %   MCV 87.9 80.0 - 100.0 fL   MCH 29.3 26.0 - 34.0 pg   MCHC 33.3 30.0 - 36.0 g/dL   RDW 86.7 61.9 - 50.9 %   Platelets 280 150 - 400 K/uL   nRBC 0.0 0.0 - 0.2 %    DG Chest Port 1 View  Result Date: 05/06/2019 CLINICAL DATA:  Code sepsis EXAM: PORTABLE CHEST 1 VIEW COMPARISON:  One-view chest x-ray 03/26/2019 FINDINGS: The heart size and mediastinal contours are within normal limits. Both lungs  are clear. The visualized skeletal structures are unremarkable. IMPRESSION: Negative one-view chest x-ray Electronically Signed   By: San Morelle M.D.   On: 05/06/2019 05:59   DG Finger Thumb Left  Result Date: 05/06/2019 CLINICAL DATA:  Infection of left thumb, history of IV drug use EXAM: LEFT THUMB 2+V COMPARISON:  None. FINDINGS: There is no evidence of fracture or dislocation. No cortical erosion or periosteal reaction. Joint spaces are preserved. No radiopaque foreign body. Focus of sclerosis at the radial aspect of the distal proximal phalanx may reflect a bone island. IMPRESSION: No evidence of osteomyelitis. Electronically Signed   By: Macy Mis M.D.   On: 05/06/2019 14:12   DG Finger Thumb Right  Result Date: 05/06/2019 CLINICAL DATA:  Right thumb infection. EXAM: RIGHT THUMB 2+V COMPARISON:  No recent. FINDINGS: Soft tissue swelling. No radiopaque foreign body. No acute bony abnormality identified. IMPRESSION: No acute abnormality identified. Electronically Signed   By: Marcello Moores  Register   On: 05/06/2019 11:55    Assessment/Plan: Day of Surgery   Principal  Problem:   Abscess-multpile sites Active Problems:   History of drug use   Anxiety   Sepsis ([place])  Recommend continuing with IV antibiotics.  Patient's thumbs were redressed with gauze.  There is no significant drainage from either thumb.  Patient's white count is 7.3 today down from 16.9 yesterday on admission.  Continue cefepime and vancomycin as ordered.  Blood cultures are negative to date.  Urine culture is negative to date.  OR cultures are pending.  Continue Lovenox for DVT prophylaxis.    Thornton Park , MD 05/07/2019, 1:48 PM

## 2019-05-07 NOTE — Consult Note (Signed)
[facility] Face-to-Face Psychiatry Consult   Reason for Consult:  Substance abuse Referring Physician:  Dr. Allena Katz Patient Identification: Jill Shepherd MRN:  [id number] Principal Diagnosis: Abscess Diagnosis:  Principal Problem:   Abscess-multpile sites Active Problems:   History of drug use   Anxiety   Sepsis (HCC)   Opiate abuse, continuous (HCC)   Methamphetamine abuse (HCC)   Total Time spent with patient: 30 minutes  Subjective:   Jill Shepherd is a 24 y.o. female patient reports that she came to the hospital because she had been using drugs and had a couple of abscesses.  She stated that she was feeling like there were things crawling out of her abscesses and that was freaking her out and so she came to the hospital.  She states that in the past she has had a lot of depression and anxiety.  She reports that she started using drugs around the age of 51.  She stated that her father died when she was 70 and then she had a stillborn baby and that she started using drugs.  She stated then a year later she had another child that died from SIDS.  She stated that she continuously use drugs and tried anything that she can get her hands on.  She states that she has been to multiple rehabilitation facilities and never follows up with outpatient.  She states that she has never stayed clean for very long and then start using drugs again.  She stated that the longest she may have been clean was about 1 month.  She reports that she knows that she is needing to get away from drugs and wants to get clean again.  She reports that she does not think that she can do a long-term stay place because she currently has a 41-year-old child.  She also reports that she is married and he is supportive but he also uses drugs.  Patient reports that she had been to [facility] in the past that she knows that she can do it since she is going to set her mind to follow-up.  She reports that she does not need anybody to tell her how to get  there or how to contact them because she has been there before.  She denies any suicidal or homicidal ideations and denies any hallucinations.  She does report of previously being put on Seroquel at bedtime which helped her with her mood and her sleep.  She states that she does have some issues with sleep and has some mood swings when she is not using any drugs.  HPI:  Per EDP: 24 y.o. female with a history of IV drug use who presents for evaluation of several abscesses.  Patient has had a left axillary abscess, right buttock abscess and bilateral thumb abscess for the last few days.  She has had chills and nausea.  She is not sure if she has had fever.  She does IV meth and heroin regularly.  She is requesting help with detox.  She denies chest pain or shortness of breath, she denies abdominal pain.  She is complaining of severe constant throbbing pain in all of her abscesses worse in bilateral thumbs.  Patient is seen by this provider via face-to-face.  Patient is very pleasant, calm, cooperative.  She did become tearful when she is talking about her deceased father and her deceased children, which is to be expected.  Patient then is speaking with me with a appropriate affect and pleasant mood.  She has denied any suicidal homicidal ideations and denies any current hallucinations.  Feel the patient is mainly experiencing either withdrawals or active symptoms of substance abuse with her reported symptoms.  Patient stated that she knows where [facility] is and that is where she plans to follow-up.  Would recommend to start patient back on Seroquel 100 mg p.o. nightly to assist with sleep and mood stability.  At this time patient does not meet any psychiatric inpatient treatment criteria and is psychiatrically cleared.  I have notified Dr. Allena Katz of the recommendations and I have ordered the Seroquel 100 mg p.o. nightly.  Past Psychiatric History: Self reported depression, anxiety, chronic heroin and methamphetamine  abuse. No hospitalizations, no suicide attempts, multiple rehab treatments in past  Risk to Self:   Risk to Others:   Prior Inpatient Therapy:   Prior Outpatient Therapy:    Past Medical History:  Past Medical History:  Diagnosis Date  . Drug abuse (HCC)   . Headache   . SAB (spontaneous abortion) 01/19/2014   stillborn at 20.4 wks   History reviewed. No pertinent surgical history. Family History:  Family History  Problem Relation Age of Onset  . Hypertension Father   . Hypertension Paternal Grandfather    Family Psychiatric  History: None reported Social History:  Social History   Substance and Sexual Activity  Alcohol Use No  . Alcohol/week: 0.0 standard drinks     Social History   Substance and Sexual Activity  Drug Use Yes  . Types: Other-see comments, Amphetamines, Fentanyl, Heroin   Comment: subutex tx    Social History   Socioeconomic History  . Marital status: Single    Spouse name: Not on file  . Number of children: Not on file  . Years of education: Not on file  . Highest education level: Not on file  Occupational History    Employer: [employer]  Tobacco Use  . Smoking status: Never Smoker  . Smokeless tobacco: Never Used  Substance and Sexual Activity  . Alcohol use: No    Alcohol/week: 0.0 standard drinks  . Drug use: Yes    Types: Other-see comments, Amphetamines, Fentanyl, Heroin    Comment: subutex tx  . Sexual activity: Not Currently    Partners: Male    Birth control/protection: None  Other Topics Concern  . Not on file  Social History Narrative  . Not on file   Social Determinants of Health   Financial Resource Strain:   . Difficulty of Paying Living Expenses:   Food Insecurity:   . Worried About Programme researcher, broadcasting/film/video in the Last Year:   . Barista in the Last Year:   Transportation Needs:   . Freight forwarder (Medical):   Marland Kitchen Lack of Transportation (Non-Medical):   Physical Activity:   . Days of Exercise per Week:    . Minutes of Exercise per Session:   Stress:   . Feeling of Stress :   Social Connections:   . Frequency of Communication with Friends and Family:   . Frequency of Social Gatherings with Friends and Family:   . Attends Religious Services:   . Active Member of Clubs or Organizations:   . Attends Banker Meetings:   Marland Kitchen Marital Status:    Additional Social History:    Allergies:  No Known Allergies  Labs:  Results for orders placed or performed during the hospital encounter of 05/06/19 (from the past 48 hour(s))  CBC  Status: Abnormal   Collection Time: 05/05/19 10:58 PM  Result Value Ref Range   WBC 16.9 (H) 4.0 - 10.5 K/uL   RBC 4.47 3.87 - 5.11 MIL/uL   Hemoglobin 13.2 12.0 - 15.0 g/dL   HCT 38.3 36.0 - 46.0 %   MCV 85.7 80.0 - 100.0 fL   MCH 29.5 26.0 - 34.0 pg   MCHC 34.5 30.0 - 36.0 g/dL   RDW 14.0 11.5 - 15.5 %   Platelets 353 150 - 400 K/uL   nRBC 0.0 0.0 - 0.2 %    Comment: Performed at [facility], [address] [place], [place] [address]  Comprehensive metabolic panel     Status: Abnormal   Collection Time: 05/05/19 10:58 PM  Result Value Ref Range   Sodium 137 135 - 145 mmol/L   Potassium 3.6 3.5 - 5.1 mmol/L   Chloride 102 98 - 111 mmol/L   CO2 22 22 - 32 mmol/L   Glucose, Bld 135 (H) 70 - 99 mg/dL    Comment: Glucose reference range applies only to samples taken after fasting for at least 8 hours.   BUN 21 (H) 6 - 20 mg/dL   Creatinine, Ser 0.51 0.44 - 1.00 mg/dL   Calcium 9.4 8.9 - 10.3 mg/dL   Total Protein 7.3 6.5 - 8.1 g/dL   Albumin 4.0 3.5 - 5.0 g/dL   AST 37 15 - 41 U/L   ALT 35 0 - 44 U/L   Alkaline Phosphatase 89 38 - 126 U/L   Total Bilirubin 0.7 0.3 - 1.2 mg/dL   GFR calc non Af Amer >60 >60 mL/min   GFR calc Af Amer >60 >60 mL/min   Anion gap 13 5 - 15    Comment: Performed at [facility], [address] [place], [place] [address]  Lactic acid, plasma     Status: Abnormal   Collection Time:  05/05/19 10:58 PM  Result Value Ref Range   Lactic Acid, Venous 2.5 (HH) 0.5 - 1.9 mmol/L    Comment: CRITICAL RESULT CALLED TO, READ BACK BY AND VERIFIED WITH ANN CALE AT 2337 ON 05/05/19 RWW Performed at [facility], [address] [place], [place] [address]   Blood culture (routine x 2)     Status: None (Preliminary result)   Collection Time: 05/06/19  4:03 AM   Specimen: BLOOD  Result Value Ref Range   Specimen Description BLOOD RIGHT ANTECUBITAL    Special Requests      BOTTLES DRAWN AEROBIC AND ANAEROBIC Blood Culture adequate volume   Culture      NO GROWTH 1 DAY Performed at [facility], [address] [place], [place] [address]    Report Status PENDING   Blood culture (routine x 2)     Status: None (Preliminary result)   Collection Time: 05/06/19  4:03 AM   Specimen: BLOOD  Result Value Ref Range   Specimen Description BLOOD LEFT ANTECUBITAL    Special Requests      BOTTLES DRAWN AEROBIC AND ANAEROBIC Blood Culture adequate volume   Culture      NO GROWTH 1 DAY Performed at [facility], [address] [place] [address], [place], [place] [address]    Report Status PENDING   Procalcitonin - Baseline     Status: None   Collection Time: 05/06/19  4:03 AM  Result Value Ref Range   Procalcitonin <0.10 ng/mL    Comment:        Interpretation: PCT (Procalcitonin) <=  0.5 ng/mL: Systemic infection (sepsis) is not likely. Local bacterial infection is possible. (NOTE)       Sepsis PCT Algorithm           Lower Respiratory Tract                                      Infection PCT Algorithm    ----------------------------     ----------------------------         PCT < 0.25 ng/mL                PCT < 0.10 ng/mL         Strongly encourage             Strongly discourage   discontinuation of antibiotics    initiation of antibiotics    ----------------------------     -----------------------------       PCT 0.25 - 0.50 ng/mL            PCT 0.10 - 0.25 ng/mL                OR       >80% decrease in PCT            Discourage initiation of                                            antibiotics      Encourage discontinuation           of antibiotics    ----------------------------     -----------------------------         PCT >= 0.50 ng/mL              PCT 0.26 - 0.50 ng/mL               AND        <80% decrease in PCT             Encourage initiation of                                             antibiotics       Encourage continuation           of antibiotics    ----------------------------     -----------------------------        PCT >= 0.50 ng/mL                  PCT > 0.50 ng/mL               AND         increase in PCT                  Strongly encourage                                      initiation of antibiotics    Strongly encourage escalation           of antibiotics                                     -----------------------------  PCT <= 0.25 ng/mL                                                 OR                                        > 80% decrease in PCT                                     Discontinue / Do not initiate                                             antibiotics Performed at [facility], [address] [place], [place] [address]   Urine culture     Status: None   Collection Time: 05/06/19  4:31 AM   Specimen: In/Out Cath Urine  Result Value Ref Range   Specimen Description      IN/OUT CATH URINE Performed at [facility], [address], [place], [place] [address]    Special Requests      NONE Performed at [facility], [address], [place], [place] [address]    Culture      NO GROWTH Performed at [facility], [address] [place], [place] [address]    Report Status 05/07/2019 FINAL   Urinalysis, Complete w Microscopic     Status: Abnormal   Collection Time: 05/06/19  4:31 AM  Result Value Ref Range   Color, Urine  STRAW (A) YELLOW   APPearance CLEAR (A) CLEAR   Specific Gravity, Urine 1.004 (L) 1.005 - 1.030   pH 7.0 5.0 - 8.0   Glucose, UA NEGATIVE NEGATIVE mg/dL   Hgb urine dipstick MODERATE (A) NEGATIVE   Bilirubin Urine NEGATIVE NEGATIVE   Ketones, ur NEGATIVE NEGATIVE mg/dL   Protein, ur NEGATIVE NEGATIVE mg/dL   Nitrite NEGATIVE NEGATIVE   Leukocytes,Ua TRACE (A) NEGATIVE   RBC / HPF 0-5 0 - 5 RBC/hpf   WBC, UA 0-5 0 - 5 WBC/hpf   Bacteria, UA RARE (A) NONE SEEN   Squamous Epithelial / LPF 0-5 0 - 5   Mucus PRESENT     Comment: Performed at [facility], [address] [place], [place] [address]  Urine Drug Screen, Qualitative ([facility] only)     Status: Abnormal   Collection Time: 05/06/19  4:31 AM  Result Value Ref Range   Tricyclic, Ur Screen NONE DETECTED NONE DETECTED   Amphetamines, Ur Screen POSITIVE (A) NONE DETECTED   MDMA (Ecstasy)Ur Screen NONE DETECTED NONE DETECTED   Cocaine Metabolite,Ur [place] NONE DETECTED NONE DETECTED   Opiate, Ur Screen NONE DETECTED NONE DETECTED   Phencyclidine (PCP) Ur S NONE DETECTED NONE DETECTED   Cannabinoid 50 Ng, Ur [place] NONE DETECTED NONE DETECTED   Barbiturates, Ur Screen NONE DETECTED NONE DETECTED   Benzodiazepine, Ur Scrn NONE DETECTED NONE DETECTED   Methadone Scn, Ur NONE DETECTED NONE DETECTED    Comment: (NOTE) Tricyclics + metabolites, urine    Cutoff 1000 ng/mL Amphetamines + metabolites, urine  Cutoff 1000 ng/mL MDMA (Ecstasy), urine              Cutoff 500 ng/mL Cocaine Metabolite, urine          Cutoff 300 ng/mL Opiate + metabolites, urine        Cutoff 300 ng/mL Phencyclidine (PCP), urine         Cutoff 25 ng/mL Cannabinoid, urine                 Cutoff 50 ng/mL Barbiturates + metabolites, urine  Cutoff 200 ng/mL Benzodiazepine, urine              Cutoff 200 ng/mL Methadone, urine                   Cutoff 300 ng/mL The urine drug screen provides only a preliminary, unconfirmed analytical test result and should not be  used for non-medical purposes. Clinical consideration and professional judgment should be applied to any positive drug screen result due to possible interfering substances. A more specific alternate chemical method must be used in order to obtain a confirmed analytical result. Gas chromatography / mass spectrometry (GC/MS) is the preferred confirmat ory method. Performed at [facility], [address] [place], [place] [address]   Lactic acid, plasma     Status: None   Collection Time: 05/06/19  5:08 AM  Result Value Ref Range   Lactic Acid, Venous 0.7 0.5 - 1.9 mmol/L    Comment: Performed at [facility], [address] [place], [place] [address]  Pregnancy, urine POC     Status: None   Collection Time: 05/06/19  5:12 AM  Result Value Ref Range   Preg Test, Ur NEGATIVE NEGATIVE    Comment:        THE SENSITIVITY OF THIS METHODOLOGY IS >24 mIU/mL   Respiratory Panel by RT PCR (Flu A&B, Covid) - Nasopharyngeal Swab     Status: None   Collection Time: 05/06/19  1:35 PM   Specimen: Nasopharyngeal Swab  Result Value Ref Range   SARS Coronavirus 2 by RT PCR NEGATIVE NEGATIVE    Comment: (NOTE) SARS-CoV-2 target nucleic acids are NOT DETECTED. The SARS-CoV-2 RNA is generally detectable in upper respiratoy specimens during the acute phase of infection. The lowest concentration of SARS-CoV-2 viral copies this assay can detect is 131 copies/mL. A negative result does not preclude SARS-Cov-2 infection and should not be used as the sole basis for treatment or other patient management decisions. A negative result may occur with  improper specimen collection/handling, submission of specimen other than nasopharyngeal swab, presence of viral mutation(s) within the areas targeted by this assay, and inadequate number of viral copies (<131 copies/mL). A negative result must be combined with clinical observations, patient history, and epidemiological information.  The expected result is Negative. Fact Sheet for Patients:  https://www.moore.com/ Fact Sheet for Healthcare Providers:  https://www.young.biz/ This test is not yet ap proved or cleared by the [place] FDA and  has been authorized for detection and/or diagnosis of SARS-CoV-2 by FDA under an Emergency Use Authorization (EUA). This EUA will remain  in effect (meaning this test can be used) for the duration of the COVID-19 declaration under Section 564(b)(1) of the Act, 21 U.S.C. section 360bbb-3(b)(1), unless the authorization is terminated or revoked sooner.    Influenza A by PCR NEGATIVE NEGATIVE   Influenza B by PCR NEGATIVE NEGATIVE    Comment: (NOTE) The Xpert Xpress SARS-CoV-2/FLU/RSV assay is intended as an aid  in  the diagnosis of influenza from Nasopharyngeal swab specimens and  should not be used as a sole basis for treatment. Nasal washings and  aspirates are unacceptable for Xpert Xpress SARS-CoV-2/FLU/RSV  testing. Fact Sheet for Patients: https://www.moore.com/ Fact Sheet for Healthcare Providers: https://www.young.biz/ This test is not yet approved or cleared by the [place] FDA and  has been authorized for detection and/or diagnosis of SARS-CoV-2 by  FDA under an Emergency Use Authorization (EUA). This EUA will remain  in effect (meaning this test can be used) for the duration of the  Covid-19 declaration under Section 564(b)(1) of the Act, 21  U.S.C. section 360bbb-3(b)(1), unless the authorization is  terminated or revoked. Performed at [facility], [address] [place], [place] [address]   Sedimentation rate     Status: None   Collection Time: 05/06/19  3:51 PM  Result Value Ref Range   Sed Rate 14 0 - 20 mm/hr    Comment: Performed at [facility], [address] [place], [place] [address]  C-reactive protein     Status: Abnormal   Collection Time:  05/06/19  3:51 PM  Result Value Ref Range   CRP 2.9 (H) <1.0 mg/dL    Comment: Performed at [facility], [address] [place], [place] [address]  Procalcitonin     Status: None   Collection Time: 05/07/19  7:20 AM  Result Value Ref Range   Procalcitonin <0.10 ng/mL    Comment:        Interpretation: PCT (Procalcitonin) <= 0.5 ng/mL: Systemic infection (sepsis) is not likely. Local bacterial infection is possible. (NOTE)       Sepsis PCT Algorithm           Lower Respiratory Tract                                      Infection PCT Algorithm    ----------------------------     ----------------------------         PCT < 0.25 ng/mL                PCT < 0.10 ng/mL         Strongly encourage             Strongly discourage   discontinuation of antibiotics    initiation of antibiotics    ----------------------------     -----------------------------       PCT 0.25 - 0.50 ng/mL            PCT 0.10 - 0.25 ng/mL               OR       >80% decrease in PCT            Discourage initiation of                                            antibiotics      Encourage discontinuation           of antibiotics    ----------------------------     -----------------------------         PCT >= 0.50 ng/mL              PCT 0.26 - 0.50 ng/mL  AND        <80% decrease in PCT             Encourage initiation of                                             antibiotics       Encourage continuation           of antibiotics    ----------------------------     -----------------------------        PCT >= 0.50 ng/mL                  PCT > 0.50 ng/mL               AND         increase in PCT                  Strongly encourage                                      initiation of antibiotics    Strongly encourage escalation           of antibiotics                                     -----------------------------                                           PCT <= 0.25 ng/mL                                                  OR                                        > 80% decrease in PCT                                     Discontinue / Do not initiate                                             antibiotics Performed at [facility], [address], [place], [place] [address]   Basic metabolic panel     Status: Abnormal   Collection Time: 05/07/19  7:20 AM  Result Value Ref Range   Sodium 137 135 - 145 mmol/L   Potassium 4.0 3.5 - 5.1 mmol/L   Chloride 111 98 - 111 mmol/L   CO2 22 22 - 32 mmol/L   Glucose, Bld 100 (H) 70 - 99 mg/dL    Comment: Glucose reference range applies only to samples taken after fasting for at least 8 hours.   BUN 12  6 - 20 mg/dL   Creatinine, Ser 1.610.44 0.44 - 1.00 mg/dL   Calcium 8.3 (L) 8.9 - 10.3 mg/dL   GFR calc non Af Amer >60 >60 mL/min   GFR calc Af Amer >60 >60 mL/min   Anion gap 4 (L) 5 - 15    Comment: Performed at [facility], [address] [place], [place] [address]  CBC     Status: Abnormal   Collection Time: 05/07/19  7:20 AM  Result Value Ref Range   WBC 7.3 4.0 - 10.5 K/uL   RBC 3.96 3.87 - 5.11 MIL/uL   Hemoglobin 11.6 (L) 12.0 - 15.0 g/dL   HCT 54.034.8 (L) 98.136.0 - 19.146.0 %   MCV 87.9 80.0 - 100.0 fL   MCH 29.3 26.0 - 34.0 pg   MCHC 33.3 30.0 - 36.0 g/dL   RDW 47.814.6 29.511.5 - 62.115.5 %   Platelets 280 150 - 400 K/uL   nRBC 0.0 0.0 - 0.2 %    Comment: Performed at [facility], [address] [place], [place] [address]    Current Facility-Administered Medications  Medication Dose Route Frequency Provider Last Rate Last Admin  . 0.9 %  sodium chloride infusion   Intravenous Continuous Leafy RoPabon, Diego F, MD   Stopped at 05/07/19 [id number]  . acetaminophen (TYLENOL) tablet 650 mg  650 mg Oral Q6H PRN Pabon, Diego F, MD      . ceFEPIme (MAXIPIME) 2 g in sodium chloride 0.9 % 100 mL IVPB  2 g Intravenous Q8H Leafy RoPabon, Diego F, MD   Stopped at 05/07/19 0735  . enoxaparin (LOVENOX) injection 30 mg  30 mg Subcutaneous Q24H  Enedina FinnerPatel, Sona, MD      . hydrOXYzine (VISTARIL) injection 25 mg  25 mg Intramuscular Q8H PRN Pabon, Diego F, MD      . ibuprofen (ADVIL) tablet 400 mg  400 mg Oral Q6H PRN Pabon, Diego F, MD      . ketorolac (TORADOL) 30 MG/ML injection 30 mg  30 mg Intravenous Q6H PRN Enedina FinnerPatel, Sona, MD      . lidocaine-prilocaine (EMLA) cream   Topical Once Pabon, [place]Diego F, MD      . LORazepam (ATIVAN) injection 1 mg  1 mg Intravenous Q6H PRN Leafy RoPabon, Diego F, MD   1 mg at 05/07/19 0155  . ondansetron (ZOFRAN) injection 4 mg  4 mg Intravenous Q8H PRN Pabon, Diego F, MD      . QUEtiapine (SEROQUEL) tablet 100 mg  100 mg Oral QHS Jeannie Mallinger, Gerlene Burdockravis B, FNP      . vancomycin (VANCOREADY) IVPB 750 mg/150 mL  750 mg Intravenous Q24H Leafy RoPabon, Diego F, MD   Stopped at 05/07/19 [id number]    Musculoskeletal: Strength & Muscle Tone: within normal limits Gait & Station: Patient remained in bed during evaluation Patient leans: N/A  Psychiatric Specialty Exam: Physical Exam  Nursing note and vitals reviewed. Constitutional: She is oriented to person, place, and time. She appears well-developed and well-nourished.  Cardiovascular: Normal rate.  Respiratory: Effort normal.  Musculoskeletal:        General: Normal range of motion.  Neurological: She is alert and oriented to person, place, and time.  Skin: Skin is warm.    Review of Systems  Constitutional: Negative.   HENT: Negative.   Eyes: Negative.   Respiratory: Negative.   Cardiovascular: Negative.   Gastrointestinal: Negative.   Genitourinary: Negative.   Musculoskeletal: Negative.   Skin: Positive for wound (Wounds from abcess drain surgery.).  Neurological:  Negative.     Blood pressure 107/65, pulse 83, temperature 97.6 F (36.4 C), resp. rate (!) 22, height 5' (1.524 m), weight 36.3 kg, SpO2 99 %, currently breastfeeding.Body mass index is 15.63 kg/m.  General Appearance: Disheveled  Eye Contact:  Fair  Speech:  Clear and Coherent and Normal Rate  Volume:   Decreased  Mood:  Euthymic  Affect:  Congruent  Thought Process:  Coherent and Descriptions of Associations: Intact  Orientation:  Full (Time, Place, and Person)  Thought Content:  WDL  Suicidal Thoughts:  No  Homicidal Thoughts:  No  Memory:  Immediate;   Fair Recent;   Fair Remote;   Fair  Judgement:  Fair  Insight:  Fair  Psychomotor Activity:  Normal  Concentration:  Concentration: Good  Recall:  Good  Fund of Knowledge:  Fair  Language:  Fair  Akathisia:  No  Handed:  Right  AIMS (if indicated):     Assets:  Communication Skills Desire for Improvement Housing Social Support  ADL's:  Intact  Cognition:  WNL  Sleep:        Treatment Plan Summary: Follow-up with [facility] after discharge  Start Seroquel 100 mg p.o. nightly for mood stability and sleep  Disposition: No evidence of imminent risk to self or others at present.   Patient does not meet criteria for psychiatric inpatient admission. Discussed crisis plan, support from social network, calling 911, coming to the Emergency Department, and calling Suicide Hotline.  Gerlene Burdock Dallin Mccorkel, FNP 05/07/2019 2:01 PM

## 2019-05-07 NOTE — Transfer of Care (Signed)
Immediate Anesthesia Transfer of Care Note  Patient: [name]  Procedure(s) Performed: INCISION AND DRAINAGE ABSCESS (Left Axilla)  Patient Location: Jill Shepherd Stay  Anesthesia Type:General  Level of Consciousness: sedated and unresponsive  Airway & Oxygen Therapy: Patient Spontanous Breathing and Patient connected to nasal cannula oxygen  Post-op Assessment: Report given to RN and Post -op Vital signs reviewed and stable  Post vital signs: Reviewed and stable  Last Vitals:  Vitals Value Taken Time  BP 103/57 05/07/19 0955  Temp 36.4 C 05/07/19 0952  Pulse 89 05/07/19 0955  Resp 16 05/07/19 0955  SpO2 99 % 05/07/19 0955  Vitals shown include unvalidated device data.  Last Pain:  Vitals:   05/07/19 0952  TempSrc:   PainSc: Asleep      Patients Stated Pain Goal: 1 (05/07/19 0025)  Complications: No apparent anesthesia complications

## 2019-05-07 NOTE — Progress Notes (Signed)
Preoperative Review   Patient is met in the preoperatively. The history is reviewed in the chart and with the patient. I personally reviewed the options and rationale as well as the risks of this procedure that have been previously discussed with the patient. All questions asked by the patient and/or family were answered to their satisfaction.  Patient agrees to proceed with this procedure at this time.  Ewing Fandino M.D. FACS   

## 2019-05-07 NOTE — Anesthesia Preprocedure Evaluation (Signed)
Anesthesia Evaluation  Patient identified by MRN, date of birth, ID band Patient awake    Reviewed: Allergy & Precautions, NPO status , Patient's Chart, lab work & pertinent test results  History of Anesthesia Complications Negative for: history of anesthetic complications  Airway Mallampati: II  TM Distance: >3 FB Neck ROM: Full    Dental no notable dental hx. 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,   Endo/Other  neg diabetes  Renal/GU negative Renal ROS     Musculoskeletal  (+) narcotic dependent  Abdominal   Peds  Hematology   Anesthesia Other Findings Past Medical History: No date: Drug abuse ([facility]CC) No date: Headache 01/19/2014: SAB (spontaneous abortion)     Comment:  stillborn at 20.4 wks  Reproductive/Obstetrics                             Anesthesia Physical Anesthesia Plan  ASA: III  Anesthesia Plan: General   Post-op Pain Management:    Induction: Intravenous  PONV Risk Score and Plan: 4 or greater and Ondansetron, Dexamethasone and Diphenhydramine  Airway Management Planned: LMA  Additional Equipment: None  Intra-op Plan:   Post-operative Plan: Extubation in OR  Informed Consent: I have reviewed the patients History and Physical, chart, labs and discussed the procedure including the risks, benefits and alternatives for the proposed anesthesia with the patient or  authorized representative who has indicated his/her understanding and acceptance.     Dental advisory given  Plan Discussed with: CRNA and Surgeon  Anesthesia Plan Comments: (Discussed risks of anesthesia with patient, including PONV, sore throat, lip/dental damage. Rare risks discussed as well, such as cardiorespiratory and neurological sequelae. Told patient she is at increased risk of anesthetic complications due to her hx of active drug abuse (meth and heroin ~3 days ago last used). Patient understands.)        Anesthesia Quick Evaluation

## 2019-05-07 NOTE — Anesthesia Procedure Notes (Signed)
Procedure Name: LMA Insertion Date/Time: 05/07/2019 9:15 AM Performed by: Estanislado Emms, CRNA Pre-anesthesia Checklist: Patient identified, Patient being monitored, Timeout performed, Emergency Drugs available and Suction available Patient Re-evaluated:Patient Re-evaluated prior to induction Oxygen Delivery Method: Circle system utilized Preoxygenation: Pre-oxygenation with 100% oxygen Induction Type: IV induction LMA: LMA inserted LMA Size: 3.0 Tube type: Oral Number of attempts: 1 Placement Confirmation: positive ETCO2 and breath sounds checked- equal and bilateral Tube secured with: Tape Dental Injury: Teeth and Oropharynx as per pre-operative assessment

## 2019-05-07 NOTE — Progress Notes (Signed)
PHARMACIST - PHYSICIAN COMMUNICATION  CONCERNING:  Enoxaparin (Lovenox) for DVT Prophylaxis    RECOMMENDATION: Patient was prescribed enoxaparin 40 mg q24 hours for VTE prophylaxis.   Filed Weights   05/05/19 2253 05/06/19 1633  Weight: 36.3 kg (80 lb) 36.3 kg (80 lb 0.4 oz)    Body mass index is 15.63 kg/m.  Estimated Creatinine Clearance: 63.2 mL/min (by C-G formula based on SCr of 0.44 mg/dL).   Based on [facility] policy patient is candidate for enoxaparin 30mg  every 24 hours based on weight less then 45kg for women  DESCRIPTION: Pharmacy has adjusted enoxaparin dose per [facility] policy.   Patient is now receiving enoxaparin 30mg  every 24 hours.  [facility], PharmD Clinical Pharmacist  05/07/2019 11:35 AM

## 2019-05-07 NOTE — Anesthesia Postprocedure Evaluation (Signed)
Anesthesia Post Note  Patient: Jill Shepherd  Procedure(s) Performed: INCISION AND DRAINAGE ABSCESS (Left Axilla)  Patient location during evaluation: PACU Anesthesia Type: General Level of consciousness: awake and alert Pain management: pain level controlled Vital Signs Assessment: post-procedure vital signs reviewed and stable Respiratory status: spontaneous breathing, nonlabored ventilation, respiratory function stable and patient connected to nasal cannula oxygen Cardiovascular status: blood pressure returned to baseline and stable Postop Assessment: no apparent nausea or vomiting Anesthetic complications: no     Last Vitals:  Vitals:   05/07/19 1010 05/07/19 1025  BP: 103/62 107/65  Pulse: 84 83  Resp: 20 (!) 22  Temp:    SpO2: 100% 99%    Last Pain:  Vitals:   05/07/19 1025  TempSrc:   PainSc: Asleep                 Corinda Gubler

## 2019-05-07 NOTE — OR Nursing (Signed)
All food and drinks removed from room.  NPO after midnight.

## 2019-05-07 NOTE — Progress Notes (Signed)
[facility]  - [place] at [facility]   PATIENT NAME: Jill Shepherd    MR#:  [id number]  DATE OF BIRTH:  21-Oct-1995  SUBJECTIVE:   Patient status post left axillary abscess incision drainage. Eating lunch. She tells me she wants to get off of doing drugs. C/o pain in fingers REVIEW OF SYSTEMS:   Review of Systems  Constitutional: Negative for chills, fever and weight loss.  HENT: Negative for ear discharge, ear pain and nosebleeds.   Eyes: Negative for blurred vision, pain and discharge.  Respiratory: Negative for sputum production, shortness of breath, wheezing and stridor.   Cardiovascular: Negative for chest pain, palpitations, orthopnea and PND.  Gastrointestinal: Negative for abdominal pain, diarrhea, nausea and vomiting.  Genitourinary: Negative for frequency and urgency.  Musculoskeletal: Negative for back pain and joint pain.  Skin:       Swelling and pain over on both thumb  Neurological: Negative for sensory change, speech change, focal weakness and weakness.  Psychiatric/Behavioral: Negative for depression and hallucinations. The patient is not nervous/anxious.    Tolerating Diet:yes Tolerating PT:   DRUG ALLERGIES:  No Known Allergies  VITALS:  Blood pressure 107/65, pulse 83, temperature 97.6 F (36.4 C), resp. rate (!) 22, height 5' (1.524 m), weight 36.3 kg, SpO2 99 %, currently breastfeeding.  PHYSICAL EXAMINATION:   Physical Exam  GENERAL:  24 y.o.-year-old patient lying in the bed with no acute distress. thin EYES: Pupils equal, round, reactive to light and accommodation. No scleral icterus.   HEENT: Head atraumatic, normocephalic. Oropharynx and nasopharynx clear.  NECK:  Supple, no jugular venous distention. No thyroid enlargement, no tenderness.  LUNGS: Normal breath sounds bilaterally, no wheezing, rales, rhonchi. No use of accessory muscles of respiration.  CARDIOVASCULAR: S1, S2 normal. No murmurs, rubs, or gallops.  ABDOMEN: Soft,  nontender, nondistended. Bowel sounds present. No organomegaly or mass.  EXTREMITIES: No cyanosis, clubbing or edema b/l.    NEUROLOGIC: Cranial nerves II through XII are intact. No focal Motor or sensory deficits b/l.   PSYCHIATRIC:  patient is alert and oriented x 3.  SKIN: small break out over the skin on the forehead and back. Packing + left axilla N  o obvious rash, lesion, or ulcer.   LABORATORY PANEL:  CBC Recent Labs  Lab 05/07/19 0720  WBC 7.3  HGB 11.6*  HCT 34.8*  PLT 280    Chemistries  Recent Labs  Lab 05/05/19 2258 05/05/19 2258 05/07/19 0720  NA 137   < > 137  K 3.6   < > 4.0  CL 102   < > 111  CO2 22   < > 22  GLUCOSE 135*   < > 100*  BUN 21*   < > 12  CREATININE 0.51   < > 0.44  CALCIUM 9.4   < > 8.3*  AST 37  --   --   ALT 35  --   --   ALKPHOS 89  --   --   BILITOT 0.7  --   --    < > = values in this interval not displayed.   Cardiac Enzymes No results for input(s): TROPONINI in the last 168 hours. RADIOLOGY:  DG Chest Port 1 View  Result Date: 05/06/2019 CLINICAL DATA:  Code sepsis EXAM: PORTABLE CHEST 1 VIEW COMPARISON:  One-view chest x-ray 03/26/2019 FINDINGS: The heart size and mediastinal contours are within normal limits. Both lungs are clear. The visualized skeletal structures are unremarkable. IMPRESSION: Negative one-view chest  x-ray Electronically Signed   By: San Morelle M.D.   On: 05/06/2019 05:59   DG Finger Thumb Left  Result Date: 05/06/2019 CLINICAL DATA:  Infection of left thumb, history of IV drug use EXAM: LEFT THUMB 2+V COMPARISON:  None. FINDINGS: There is no evidence of fracture or dislocation. No cortical erosion or periosteal reaction. Joint spaces are preserved. No radiopaque foreign body. Focus of sclerosis at the radial aspect of the distal proximal phalanx may reflect a bone island. IMPRESSION: No evidence of osteomyelitis. Electronically Signed   By: Macy Mis M.D.   On: 05/06/2019 14:12   DG Finger Thumb  Right  Result Date: 05/06/2019 CLINICAL DATA:  Right thumb infection. EXAM: RIGHT THUMB 2+V COMPARISON:  No recent. FINDINGS: Soft tissue swelling. No radiopaque foreign body. No acute bony abnormality identified. IMPRESSION: No acute abnormality identified. Electronically Signed   By: Marcello Moores  Register   On: 05/06/2019 11:55   ASSESSMENT AND PLAN:   Jill Shepherd is a 24 y.o. female with medical history significant of anxiety, polysubstance abuse, IV drug use, who presents with multiple abscesses. Pt states that she has multiple abscesses which has been going on in the past few days, including left axillaryabscess, right buttock abscess and bilateral thumb abscess.  Sepsis due to abscess-multpile sites: POA --now improved -pt has abscess in right buttock, left axilla, bilateral thumb paronychia. - All wounds were treated with I&D by EDP.  - Patient remains critical for sepsis with leukocytosis, tachycardia, tachypnea.  -Lactic acid elevated at 2.5, which is normalized with IV fluid.   -General surgeon, Dr. Dahlia Byes --did I and D left axillary abscess - orthopedic surgeon, Dr. Mack Guise input appreciated--if thumb infection does not improve will need MRI hand and possible I anD - Empiric antimicrobial treatment with vancomycin and cefepime-- de-escalate once deep wound cultures available - PRN Zofran for nausea - Pain control: As needed Tylenol, ibuprofen, IV ketorolac (NO NARCOTICS GIVEN SUBSTANCE ABUSE) - Blood cultures x 2 -->so far negative -check TTE  History of drug use -prn ativan -did counseling about importance of quitting substance abuse -patient recently came to the ER on 21st of March. She had used heroin, methamphetamine and fentanyl -psych consultation for anxiety and substance abuse  Anxiety: -As needed hydroxyzine  Procedures:I and D left axilla Family communication :none Consults :Surgery, psych Discharge Disposition :from home--home in 1-3 days CODE STATUS:  full DVT Prophylaxis :lovenox Barriers to discharge:none  TOTAL TIME TAKING CARE OF THIS PATIENT: 30 minutes.  >50% time spent on counselling and coordination of care  Note: This dictation was prepared with Dragon dictation along with smaller phrase technology. Any transcriptional errors that result from this process are unintentional.  Fritzi Mandes M.D    [facility]   CC: Primary care physician; Patient, No Pcp PerPatient ID: Jill Shepherd, female   DOB: [birth year], 24 y.o.   MRN: [id number]

## 2019-05-08 LAB — PROCALCITONIN: Procalcitonin: <0.1 ng/mL

## 2019-05-08 LAB — CREATININE, SERUM
Creatinine, Ser: 0.49 mg/dL (ref 0.44–1.00)
GFR calc Af Amer: >60 mL/min (ref >60)
GFR calc non Af Amer: >60 mL/min (ref >60)

## 2019-05-08 MED ORDER — VANCOMYCIN HCL 750 MG/150ML IV SOLN: 750.0000 mg | INTRAVENOUS | Status: DC

## 2019-05-08 MED ORDER — VANCOMYCIN HCL IN DEXTROSE 750-5 MG/150ML-% IV SOLN
750.0000 mg | INTRAVENOUS | Status: DC
  Administered 2019-05-09: 09:00:00 750 mg via INTRAVENOUS
  Filled 2019-05-08 (×2): qty 150

## 2019-05-08 MED ORDER — KETOROLAC TROMETHAMINE 30 MG/ML IJ SOLN
INTRAMUSCULAR | Status: AC
  Administered 2019-05-08: 17:00:00 30 mg via INTRAVENOUS
  Filled 2019-05-08: qty 1

## 2019-05-08 MED ORDER — KETOROLAC TROMETHAMINE 30 MG/ML IJ SOLN
INTRAMUSCULAR | Status: AC
  Administered 2019-05-08: 30 mg via INTRAVENOUS
  Filled 2019-05-08: qty 1

## 2019-05-08 MED ORDER — SODIUM CHLORIDE FLUSH 0.9 % IV SOLN
INTRAVENOUS | Status: AC
  Filled 2019-05-08: qty 10

## 2019-05-08 NOTE — Progress Notes (Signed)
[facility]  - [place] at [facility]   PATIENT NAME: Jill Shepherd    MR#:  [id number]  DATE OF BIRTH:  Nov 05, 1995  SUBJECTIVE:   Patient status post left axillary abscess incision drainage. Eating lunch. She tells me she wants to get off of doing drugs. C/o pain in fingers and new abscess over the left ring finger REVIEW OF SYSTEMS:   Review of Systems  Constitutional: Negative for chills, fever and weight loss.  HENT: Negative for ear discharge, ear pain and nosebleeds.   Eyes: Negative for blurred vision, pain and discharge.  Respiratory: Negative for sputum production, shortness of breath, wheezing and stridor.   Cardiovascular: Negative for chest pain, palpitations, orthopnea and PND.  Gastrointestinal: Negative for abdominal pain, diarrhea, nausea and vomiting.  Genitourinary: Negative for frequency and urgency.  Musculoskeletal: Negative for back pain and joint pain.  Skin:       Swelling and pain over on both thumb  Neurological: Negative for sensory change, speech change, focal weakness and weakness.  Psychiatric/Behavioral: Negative for depression and hallucinations. The patient is not nervous/anxious.    Tolerating Diet:yes Tolerating PT:   DRUG ALLERGIES:  No Known Allergies  VITALS:  Blood pressure 97/65, pulse 76, temperature 97.7 F (36.5 C), resp. rate 16, height 5' (1.524 m), weight 36.3 kg, SpO2 98 %, currently breastfeeding.  PHYSICAL EXAMINATION:   Physical Exam  GENERAL:  24 y.o.-year-old patient lying in the bed with no acute distress. thin EYES: Pupils equal, round, reactive to light and accommodation. No scleral icterus.   HEENT: Head atraumatic, normocephalic. Oropharynx and nasopharynx clear.  NECK:  Supple, no jugular venous distention. No thyroid enlargement, no tenderness.  LUNGS: Normal breath sounds bilaterally, no wheezing, rales, rhonchi. No use of accessory muscles of respiration.  CARDIOVASCULAR: S1, S2 normal. No murmurs,  rubs, or gallops.  Left 4th finger      ABDOMEN: Soft, nontender, nondistended. Bowel sounds present. No organomegaly or mass.  EXTREMITIES: No cyanosis, clubbing or edema b/l.    NEUROLOGIC: Cranial nerves II through XII are intact. No focal Motor or sensory deficits b/l.   PSYCHIATRIC:  patient is alert and oriented x 3.  SKIN: buttok I an D site Packing + left axilla N    LABORATORY PANEL:  CBC Recent Labs  Lab 05/07/19 0720  WBC 7.3  HGB 11.6*  HCT 34.8*  PLT 280    Chemistries  Recent Labs  Lab 05/05/19 2258 05/05/19 2258 05/07/19 0720 05/07/19 0720 05/08/19 0731  NA 137   < > 137  --   --   K 3.6   < > 4.0  --   --   CL 102   < > 111  --   --   CO2 22   < > 22  --   --   GLUCOSE 135*   < > 100*  --   --   BUN 21*   < > 12  --   --   CREATININE 0.51   < > 0.44   < > 0.49  CALCIUM 9.4   < > 8.3*  --   --   AST 37  --   --   --   --   ALT 35  --   --   --   --   ALKPHOS 89  --   --   --   --   BILITOT 0.7  --   --   --   --    < > =  values in this interval not displayed.   Cardiac Enzymes No results for input(s): TROPONINI in the last 168 hours. RADIOLOGY:  DG Finger Thumb Left  Result Date: 05/06/2019 CLINICAL DATA:  Infection of left thumb, history of IV drug use EXAM: LEFT THUMB 2+V COMPARISON:  None. FINDINGS: There is no evidence of fracture or dislocation. No cortical erosion or periosteal reaction. Joint spaces are preserved. No radiopaque foreign body. Focus of sclerosis at the radial aspect of the distal proximal phalanx may reflect a bone island. IMPRESSION: No evidence of osteomyelitis. Electronically Signed   By: Macy Mis M.D.   On: 05/06/2019 14:12   DG Finger Thumb Right  Result Date: 05/06/2019 CLINICAL DATA:  Right thumb infection. EXAM: RIGHT THUMB 2+V COMPARISON:  No recent. FINDINGS: Soft tissue swelling. No radiopaque foreign body. No acute bony abnormality identified. IMPRESSION: No acute abnormality identified. Electronically  Signed   By: Marcello Moores  Register   On: 05/06/2019 11:55   ASSESSMENT AND PLAN:   TWILA RAPPA is a 24 y.o. female with medical history significant of anxiety, polysubstance abuse, IV drug use, who presents with multiple abscesses. Pt states that she has multiple abscesses which has been going on in the past few days, including left axillaryabscess, right buttock abscess and bilateral thumb abscess.  Sepsis due to abscess-multpile sites: POA --now improved -pt has abscess in right buttock, left axilla, bilateral thumb paronychia. -now has new abscess on left 4th finger - All wounds were treated with I&D by EDP.    -Lactic acid elevated at 2.5, which is normalized with IV fluid.   -4/9 General surgeon, Dr. Dahlia Byes --did I and D left axillary abscess  - orthopedic surgeon, Dr. Mack Guise input appreciated--if thumb infection does not improve will need MRI hand and possible I and D--for now looks stable - Empiric antimicrobial treatment with vancomycin and cefepime-- de-escalate once deep wound cultures available -ID consult placed for tomorrow - PRN Zofran for nausea - Pain control: As needed Tylenol, ibuprofen, IV ketorolac (NO NARCOTICS GIVEN SUBSTANCE ABUSE) - Blood cultures x 2 -->so far negative -check TTE  History of drug use -prn ativan -did counseling about importance of quitting substance abuse -patient recently came to the ER on 21st of March. She had used heroin, methamphetamine and fentanyl -psych consultation for anxiety and substance abuse  Anxiety: -As needed hydroxyzine  Procedures:I and D left axilla Family communication :none Consults :Surgery, psych Discharge Disposition :from home-- TBD--still developing new abscess CODE STATUS: full DVT Prophylaxis :lovenox Barriers to discharge:none  TOTAL TIME TAKING CARE OF THIS PATIENT: 30 minutes.  >50% time spent on counselling and coordination of care  Note: This dictation was prepared with Dragon dictation along with  smaller phrase technology. Any transcriptional errors that result from this process are unintentional.  Fritzi Mandes M.D    [facility]   CC: Primary care physician; Patient, No Pcp PerPatient ID: Irven Shelling, female   DOB: 1995/03/12, 24 y.o.   MRN: [id number]

## 2019-05-08 NOTE — Progress Notes (Signed)
S/p I/D Needs daily packing and a/bs No further general surgery issues We will be available

## 2019-05-08 NOTE — Progress Notes (Addendum)
Subjective:  Patient noted to have a new abscess at the tip of the distal phalanx of the right ring finger by Dr. Posey Pronto this morning.  Patient has previously undergone incision and drainage of both thumbs by the emergency department physician Dr. Alfred Levins.  Patient has been receiving IV antibiotics.  She has a history of IV drug use.  Patient is more alert today and answers questions appropriately and is able to follow commands.  An adult female is at the bedside.  Objective:   VITALS:   Vitals:   05/08/19 0550 05/08/19 0815 05/08/19 1223 05/08/19 1606  BP: 97/65 133/85 126/84 105/65  Pulse: 76 100 (!) 106 (!) 133  Resp: 16 17 18 18   Temp: 97.7 F (36.5 C) 97.9 F (36.6 C) 98.1 F (36.7 C) 98.4 F (36.9 C)  TempSrc:  Temporal Temporal Temporal  SpO2:  99% 99% 100%  Weight:      Height:        PHYSICAL EXAM: The distal phalanx of both the right and left thumbs demonstrate improved erythema and reduced swelling over the past 48 hours.  Patient is no longer tender to palpation over the pulp of distal phalanx in both thumbs.  Patient has intact sensation light touch and has intact motor function in both arms.  Her thumbs are well-perfused.  Right ring finger: Patient has an abscess over the tip of the distal phalanx.  She has a receded nail plate likely from biting.  Patient has significant tenderness to palpation of the distal phalanx of the right ring finger.  Her finger is well-perfused.  There is swelling of the pulp of the distal phalanx.  Patient has intact sensation light touch and intact motor function.  She does not have significant pain with active flexion extension of the DIP joint.  Picture of this abscess is available in Dr. Gus Height Patel's note earlier today.   LABS  Results for orders placed or performed during the hospital encounter of 05/06/19 (from the past 24 hour(s))  Procalcitonin     Status: None   Collection Time: 05/08/19  7:31 AM  Result Value Ref Range   Procalcitonin <0.10 ng/mL  Creatinine, serum     Status: None   Collection Time: 05/08/19  7:31 AM  Result Value Ref Range   Creatinine, Ser 0.49 0.44 - 1.00 mg/dL   GFR calc non Af Amer >60 >60 mL/min   GFR calc Af Amer >60 >60 mL/min    No results found.  Assessment/Plan: 1 Day Post-Op   Principal Problem:   Abscess-multpile sites Active Problems:   History of drug use   Anxiety   Sepsis ([place])   Opiate abuse, continuous ([place])   Methamphetamine abuse ([place])  I recommend to the patient that we give her a digital block and drain the abscess.  I am concerned that the patient may be throwing septic emboli given the new appearance of this abscess today.  Dr. Posey Pronto has ordered a transthoracic echocardiogram.  Continue antimicrobial treatment with vancomycin and cefepime.  ID consult placed for tomorrow.  Procedure note: Patient was prepped with Betadine in the web spaces between the middle and ring as well as the ring and small fingers of the right hand.  Under sterile conditions patient was given 5 cc of 1% lidocaine plain between both webspaces.  After several minutes the patient's ring finger was prepped with Betadine.  Using sterile technique a 15 blade was used to incise the tip of the right ring finger  directly over the abscess.  A small amount of purulent fluid and blood drained from the finger.  A hemostat was used to bluntly break up the septations at the tip of the distal phalanx.  The wound was left open to drain and a gauze dressing was applied over the right ring finger after the Betadine was cleaned off with alcohol swabs.  Patient tolerated the procedure well.   Juanell Fairly , MD 05/08/2019, 7:35 PM

## 2019-05-09 ENCOUNTER — Inpatient Hospital Stay (HOSPITAL_COMMUNITY)
Admit: 2019-05-09 | Discharge: 2019-05-09 | Disposition: A | Discharge status: Home / Self Care | Payer: Self-pay | Attending: Surgery | Admitting: Surgery

## 2019-05-09 DIAGNOSIS — L02511 Cutaneous abscess of right hand: Secondary | ICD-10-CM

## 2019-05-09 DIAGNOSIS — L02412 Cutaneous abscess of left axilla: Secondary | ICD-10-CM

## 2019-05-09 DIAGNOSIS — F1910 Other psychoactive substance abuse, uncomplicated: Secondary | ICD-10-CM

## 2019-05-09 DIAGNOSIS — L02512 Cutaneous abscess of left hand: Secondary | ICD-10-CM

## 2019-05-09 DIAGNOSIS — R9431 Abnormal electrocardiogram [ECG] [EKG]: Secondary | ICD-10-CM

## 2019-05-09 DIAGNOSIS — Z8614 Personal history of Methicillin resistant Staphylococcus aureus infection: Secondary | ICD-10-CM

## 2019-05-09 DIAGNOSIS — L0231 Cutaneous abscess of buttock: Secondary | ICD-10-CM

## 2019-05-09 DIAGNOSIS — B9562 Methicillin resistant Staphylococcus aureus infection as the cause of diseases classified elsewhere: Secondary | ICD-10-CM

## 2019-05-09 LAB — BASIC METABOLIC PANEL
Anion gap: 7 (ref 5–15)
BUN: 16 mg/dL (ref 6–20)
CO2: 23 mmol/L (ref 22–32)
Calcium: 8.6 mg/dL — ABNORMAL LOW (ref 8.9–10.3)
Chloride: 108 mmol/L (ref 98–111)
Creatinine, Ser: 0.52 mg/dL (ref 0.44–1.00)
GFR calc Af Amer: >60 mL/min (ref >60)
GFR calc non Af Amer: >60 mL/min (ref >60)
Glucose, Bld: 93 mg/dL (ref 70–99)
Potassium: 3.5 mmol/L (ref 3.5–5.1)
Sodium: 138 mmol/L (ref 135–145)

## 2019-05-09 LAB — ECHOCARDIOGRAM COMPLETE
Height: 60 in
Weight: 1280.43 oz

## 2019-05-09 LAB — MRSA PCR SCREENING: MRSA by PCR: POSITIVE — AB

## 2019-05-09 MED ORDER — KETOROLAC TROMETHAMINE 30 MG/ML IJ SOLN
INTRAMUSCULAR | Status: AC
  Administered 2019-05-09: 30 mg via INTRAVENOUS
  Filled 2019-05-09: qty 1

## 2019-05-09 MED ORDER — SODIUM CHLORIDE FLUSH 0.9 % IV SOLN
INTRAVENOUS | Status: AC
  Administered 2019-05-10: 06:00:00 5 mL
  Filled 2019-05-09: qty 10

## 2019-05-09 MED ORDER — SODIUM CHLORIDE FLUSH 0.9 % IV SOLN
INTRAVENOUS | Status: AC
  Administered 2019-05-09: 10 mL
  Filled 2019-05-09: qty 10

## 2019-05-09 MED ORDER — ENSURE ENLIVE PO LIQD
237.0000 mL | Freq: Two times a day (BID) | ORAL | Status: DC
  Administered 2019-05-09 – 2019-05-10 (×2): 237 mL via ORAL

## 2019-05-09 MED ORDER — QUETIAPINE FUMARATE 25 MG PO TABS
50.0000 mg | ORAL_TABLET | Freq: Every day | ORAL | Status: DC
  Administered 2019-05-09: 50 mg via ORAL
  Filled 2019-05-09 (×2): qty 2

## 2019-05-09 MED ORDER — LINEZOLID 600 MG PO TABS
600.0000 mg | ORAL_TABLET | Freq: Two times a day (BID) | ORAL | Status: DC
  Administered 2019-05-09 – 2019-05-10 (×2): 600 mg via ORAL
  Filled 2019-05-09 (×3): qty 1

## 2019-05-09 NOTE — TOC Initial Note (Signed)
Transition of Care [facility]) - Initial/Assessment Note    Patient Details  Name: Jill Shepherd MRN: [id number] Date of Birth: 1995-12-01  Transition of Care [facility]) CM/SW Contact:    Newport Cellar, RN Phone Number: 05/09/2019, 3:04 PM  Clinical Narrative:                 Sherron Monday with Iantha Fallen at DSS intake regarding CPS referral for toddler at home and active drug use. Iantha Fallen states child is already in the DSS system. DSS will follow up with patient during hospitalization.        Patient Goals and CMS Choice        Expected Discharge Plan and Services                                                Prior Living Arrangements/Services                       Activities of Daily Living Home Assistive Devices/Equipment: None ADL Screening (condition at time of admission) Patient's cognitive ability adequate to safely complete daily activities?: Yes Is the patient deaf or have difficulty hearing?: No Does the patient have difficulty seeing, even when wearing glasses/contacts?: No Does the patient have difficulty concentrating, remembering, or making decisions?: No Patient able to express need for assistance with ADLs?: Yes Does the patient have difficulty dressing or bathing?: No Independently performs ADLs?: Yes (appropriate for developmental age) Does the patient have difficulty walking or climbing stairs?: Yes Weakness of Legs: Both Weakness of Arms/Hands: Both  Permission Sought/Granted                  Emotional Assessment              Admission diagnosis:  Abscess [L02.91] Paronychia of thumb, unspecified laterality [L03.019] Sepsis (HCC) [A41.9] Sepsis without acute organ dysfunction, due to unspecified organism [facility]) [A41.9] Patient Active Problem List   Diagnosis Date Noted  . Opiate abuse, continuous (HCC) 05/07/2019  . 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 Sepsis (HCC) 05/06/2019  . Abscess-multpile sites 05/06/2019  . Anxiety  08/04/2017  . Postpartum depression 08/04/2017  . Labor and delivery indication for care or intervention 07/17/2017  . SVD (spontaneous vaginal delivery) 07/17/2017  . Pregnancy 07/07/2017  . History of preterm delivery 11/28/2016  . Pregnancy complicated by subutex maintenance, antepartum (HCC) 11/28/2016  . History of preterm premature rupture of membranes (PPROM) 09/25/2015  . History of pregnancy loss in prior pregnancy, currently pregnant 05/14/2015  . History of drug use 05/14/2015  . Underweight 05/14/2015   PCP:  Patient, No Pcp Per Pharmacy:   [facility] [address] [place] - [address] [place] [address] Phone: [phone] Fax: [phone]     Social Determinants of Health (SDOH) Interventions    Readmission Risk Interventions No flowsheet data found.

## 2019-05-09 NOTE — Progress Notes (Signed)
[facility]  - [place] [facility]   PATIENT NAME: Jill Shepherd    MR#:  [id number]  DATE OF BIRTH:  12/05/1995  SUBJECTIVE:   Patient status post left axillary abscess incision drainage. Eating lunch. She tells me she wants to get off of doing drugs. S/p I and D of left ring finger Sleeping Some anxiety earlier per RN REVIEW OF SYSTEMS:   Review of Systems  Constitutional: Negative for chills, fever and weight loss.  HENT: Negative for ear discharge, ear pain and nosebleeds.   Eyes: Negative for blurred vision, pain and discharge.  Respiratory: Negative for sputum production, shortness of breath, wheezing and stridor.   Cardiovascular: Negative for chest pain, palpitations, orthopnea and PND.  Gastrointestinal: Negative for abdominal pain, diarrhea, nausea and vomiting.  Genitourinary: Negative for frequency and urgency.  Musculoskeletal: Negative for back pain and joint pain.  Skin:       Swelling and pain over on both thumb  Neurological: Negative for sensory change, speech change, focal weakness and weakness.  Psychiatric/Behavioral: Negative for depression and hallucinations. The patient is not nervous/anxious.    Tolerating Diet:yes Tolerating PT:   DRUG ALLERGIES:  No Known Allergies  VITALS:  Blood pressure 111/73, pulse 89, temperature 98.4 F (36.9 C), temperature source Temporal, resp. rate 16, height 5' (1.524 m), weight 36.3 kg, SpO2 98 %, currently breastfeeding.  PHYSICAL EXAMINATION:   Physical Exam  GENERAL:  24 y.o.-year-old patient lying in the bed with no acute distress. thin EYES: Pupils equal, round, reactive to light and accommodation. No scleral icterus.   HEENT: Head atraumatic, normocephalic. Oropharynx and nasopharynx clear.  NECK:  Supple, no jugular venous distention. No thyroid enlargement, no tenderness.  LUNGS: Normal breath sounds bilaterally, no wheezing, rales, rhonchi. No use of accessory muscles of respiration.    CARDIOVASCULAR: S1, S2 normal. No murmurs, rubs, or gallops.  Left 4th finger      ABDOMEN: Soft, nontender, nondistended. Bowel sounds present. No organomegaly or mass.  EXTREMITIES: No cyanosis, clubbing or edema b/l.    NEUROLOGIC: Cranial nerves II through XII are intact. No focal Motor or sensory deficits b/l.   PSYCHIATRIC:  patient is alert .  SKIN: buttok I an D site Packing + left axilla N    LABORATORY PANEL:  CBC Recent Labs  Lab 05/07/19 0720  WBC 7.3  HGB 11.6*  HCT 34.8*  PLT 280    Chemistries  Recent Labs  Lab 05/05/19 2258 05/07/19 0720 05/09/19 0640  NA 137   < > 138  K 3.6   < > 3.5  CL 102   < > 108  CO2 22   < > 23  GLUCOSE 135*   < > 93  BUN 21*   < > 16  CREATININE 0.51   < > 0.52  CALCIUM 9.4   < > 8.6*  AST 37  --   --   ALT 35  --   --   ALKPHOS 89  --   --   BILITOT 0.7  --   --    < > = values in this interval not displayed.   Cardiac Enzymes No results for input(s): TROPONINI in the last 168 hours. RADIOLOGY:  ECHOCARDIOGRAM COMPLETE  Result Date: 05/09/2019    ECHOCARDIOGRAM REPORT   Patient Name:   CATHYANN KILFOYLE Date of Exam: 05/09/2019 Medical Rec #:  [id number]      Height:       60.0 in Accession #:  [id number]     Weight:       80.0 lb Date of Birth:  1996/01/21       BSA:          1.265 m Patient Age:    24 years       BP:           107/62 mmHg Patient Gender: F              HR:           79 bpm. Exam Location:  [facility] Procedure: 2D Echo, Color Doppler and Cardiac Doppler Indications:     R94.31 Abnormal ECG  History:         Patient has no prior history of Echocardiogram examinations.                  Drug abuse.  Sonographer:     Humphrey Rolls RDCS (AE) Referring Phys:  [id number] DIEGO F PABON Diagnosing Phys: Lorine Bears MD IMPRESSIONS  1. Left ventricular ejection fraction, by estimation, is 60 to 65%. The left ventricle has normal function. The left ventricle has no regional wall motion abnormalities. Left ventricular diastolic  parameters were normal.  2. Right ventricular systolic function is normal. The right ventricular size is normal. Tricuspid regurgitation signal is inadequate for assessing PA pressure.  3. The mitral valve is normal in structure. No evidence of mitral valve regurgitation. No evidence of mitral stenosis.  4. The aortic valve is normal in structure. Aortic valve regurgitation is not visualized. No aortic stenosis is present.  5. The inferior vena cava is normal in size with greater than 50% respiratory variability, suggesting right atrial pressure of 3 mmHg. Conclusion(s)/Recommendation(s): Normal biventricular function without evidence of hemodynamically significant valvular heart disease. FINDINGS  Left Ventricle: Left ventricular ejection fraction, by estimation, is 60 to 65%. The left ventricle has normal function. The left ventricle has no regional wall motion abnormalities. The left ventricular internal cavity size was normal in size. There is  no left ventricular hypertrophy. Left ventricular diastolic parameters were normal. Right Ventricle: The right ventricular size is normal. No increase in right ventricular wall thickness. Right ventricular systolic function is normal. Tricuspid regurgitation signal is inadequate for assessing PA pressure. Left Atrium: Left atrial size was normal in size. Right Atrium: Right atrial size was normal in size. Pericardium: There is no evidence of pericardial effusion. Mitral Valve: The mitral valve is normal in structure. Normal mobility of the mitral valve leaflets. No evidence of mitral valve regurgitation. No evidence of mitral valve stenosis. MV peak gradient, 2.8 mmHg. The mean mitral valve gradient is 1.0 mmHg. Tricuspid Valve: The tricuspid valve is normal in structure. Tricuspid valve regurgitation is not demonstrated. No evidence of tricuspid stenosis. Aortic Valve: The aortic valve is normal in structure. Aortic valve regurgitation is not visualized. No aortic  stenosis is present. Aortic valve mean gradient measures 2.0 mmHg. Aortic valve peak gradient measures 4.3 mmHg. Aortic valve area, by VTI measures 2.48 cm. Pulmonic Valve: The pulmonic valve was normal in structure. Pulmonic valve regurgitation is not visualized. No evidence of pulmonic stenosis. Aorta: The aortic root is normal in size and structure. Venous: The inferior vena cava is normal in size with greater than 50% respiratory variability, suggesting right atrial pressure of 3 mmHg. IAS/Shunts: No atrial level shunt detected by color flow Doppler.  LEFT VENTRICLE PLAX 2D LVIDd:         4.27 cm  Diastology LVIDs:  2.50 cm  LV e' lateral:   17.00 cm/s LV PW:         0.73 cm  LV E/e' lateral: 3.2 LV IVS:        0.45 cm  LV e' medial:    10.90 cm/s LVOT diam:     1.90 cm  LV E/e' medial:  5.0 LV SV:         44 LV SV Index:   35 LVOT Area:     2.84 cm  RIGHT VENTRICLE RV Basal diam:  2.05 cm LEFT ATRIUM             Index       RIGHT ATRIUM          Index LA diam:        2.50 cm 1.98 cm/m  RA Area:     8.63 cm LA Vol (A2C):   19.3 ml 15.26 ml/m RA Volume:   16.10 ml 12.73 ml/m LA Vol (A4C):   18.2 ml 14.39 ml/m LA Biplane Vol: 19.9 ml 15.73 ml/m  AORTIC VALVE                   PULMONIC VALVE AV Area (Vmax):    2.38 cm    PV Vmax:       0.88 m/s AV Area (Vmean):   2.49 cm    PV Vmean:      60.700 cm/s AV Area (VTI):     2.48 cm    PV VTI:        0.164 m AV Vmax:           104.00 cm/s PV Peak grad:  3.1 mmHg AV Vmean:          72.300 cm/s PV Mean grad:  2.0 mmHg AV VTI:            0.178 m AV Peak Grad:      4.3 mmHg AV Mean Grad:      2.0 mmHg LVOT Vmax:         87.20 cm/s LVOT Vmean:        63.400 cm/s LVOT VTI:          0.156 m LVOT/AV VTI ratio: 0.88  AORTA Ao Root diam: 2.70 cm MITRAL VALVE MV Area (PHT): 4.21 cm    SHUNTS MV Peak grad:  2.8 mmHg    Systemic VTI:  0.16 m MV Mean grad:  1.0 mmHg    Systemic Diam: 1.90 cm MV Vmax:       0.83 m/s MV Vmean:      53.2 cm/s MV Decel Time: 180 msec MV  E velocity: 54.30 cm/s MV A velocity: 57.70 cm/s MV E/A ratio:  0.94 Lorine Bears MD Electronically signed by Lorine Bears MD Signature Date/Time: 05/09/2019/1:04:25 PM    Final    ASSESSMENT AND PLAN:   ROX MCGRIFF is a 24 y.o. female with medical history significant of anxiety, polysubstance abuse, IV drug use, who presents with multiple abscesses. Pt states that she has multiple abscesses which has been going on in the past few days, including left axillaryabscess, right buttock abscess and bilateral thumb abscess.  Sepsis due to abscess-multpile sites: POA --now improved -pt has abscess in right buttock, left axilla, bilateral thumb paronychia. -4/11 pt has new abscess on left 4th finger--s/p I and D by dr Kirtland Bouchard -Lactic acid elevated at 2.5--now normal -4/9 General surgeon, Dr. Everlene Farrier --did I and D left axillary abscess  - orthopedic  surgeon, Dr. Mack Guise input appreciated--if thumb infection does not improve will need MRI hand and possible I and D--for now looks stable - Empiric antimicrobial treatment with vancomycin and cefepime - deep wound cultures-- MRSA + -ID consult today - PRN Zofran for nausea - Pain control: As needed Tylenol, ibuprofen, IV ketorolac (NO NARCOTICS GIVEN SUBSTANCE ABUSE) - Blood cultures x 2 -->so far negative - TTE--no valvular abnormality  History of drug use -did counseling about importance of quitting substance abuse -patient recently came to the ER on 21st of March. She had used heroin, methamphetamine and fentanyl -psych consultation for anxiety and substance abuse--appreciated. Started on Seroquel  Anxiety: -As needed hydroxyzine  Procedures:I and D left axilla, right buttock, left ring finger Family communication :none Consults :Surgery, psych, ortho Discharge Disposition :from home-- TBD--still developing new abscess Awaiting ID consult for abx  CODE STATUS: full DVT Prophylaxis :lovenox   TOTAL TIME TAKING CARE OF THIS PATIENT: 30  minutes.  >50% time spent on counselling and coordination of care  Note: This dictation was prepared with Dragon dictation along with smaller phrase technology. Any transcriptional errors that result from this process are unintentional.  Fritzi Mandes M.D    [facility]   CC: Primary care physician; Patient, No Pcp PerPatient ID: Irven Shelling, female   DOB: February 09, 1995, 24 y.o.   MRN: [id number]

## 2019-05-09 NOTE — Progress Notes (Signed)
*  PRELIMINARY RESULTS* Echocardiogram 2D Echocardiogram has been performed.  Joanette Gula Sherol Sabas 05/09/2019, 9:16 AM

## 2019-05-09 NOTE — OR Nursing (Addendum)
Received notification from lab that patient is MRSA positive.  Isolation cart at room.

## 2019-05-09 NOTE — Progress Notes (Signed)
Subjective:  Patient is status post bedside I&D of the right ring finger yesterday.  Patient denies significant pain.  Objective:   VITALS:   Vitals:   05/08/19 1223 05/08/19 1606 05/08/19 2000 05/09/19 1311  BP: 126/84 105/65 107/62 111/73  Pulse: (!) 106 (!) 133 (!) 111 89  Resp: 18 18  16   Temp: 98.1 F (36.7 C) 98.4 F (36.9 C)    TempSrc: Temporal Temporal    SpO2: 99% 100%  98%  Weight:      Height:        PHYSICAL EXAM: Bilateral thumbs: Patient has significant reduction in swelling and erythema in both thumbs.  She has responded well to the IV antibiotics.  Patient remains neurovascular intact in both thumbs.  Right ring finger: Patient's abscess remains decompressed.  There is no significant drainage from her incision.  Patient's ring finger was redressed today.  She is neurovascular intact in the right ring finger.  There is no evidence of new abscesses in her digits.   LABS  Results for orders placed or performed during the hospital encounter of 05/06/19 (from the past 24 hour(s))  Basic metabolic panel     Status: Abnormal   Collection Time: 05/09/19  6:40 AM  Result Value Ref Range   Sodium 138 135 - 145 mmol/L   Potassium 3.5 3.5 - 5.1 mmol/L   Chloride 108 98 - 111 mmol/L   CO2 23 22 - 32 mmol/L   Glucose, Bld 93 70 - 99 mg/dL   BUN 16 6 - 20 mg/dL   Creatinine, Ser 07/09/19 0.44 - 1.00 mg/dL   Calcium 8.6 (L) 8.9 - 10.3 mg/dL   GFR calc non Af Amer >60 >60 mL/min   GFR calc Af Amer >60 >60 mL/min   Anion gap 7 5 - 15    ECHOCARDIOGRAM COMPLETE  Result Date: 05/09/2019    ECHOCARDIOGRAM REPORT   Patient Name:   Jill Shepherd Date of Exam: 05/09/2019 Medical Rec #:  07/09/[id number]      Height:       60.0 in Accession #:    [id number]     Weight:       80.0 lb Date of Birth:  09/23/1995       BSA:          1.265 m Patient Age:    23 years       BP:           107/62 mmHg Patient Gender: F              HR:           79 bpm. Exam Location:  [facility] Procedure: 2D Echo,  Color Doppler and Cardiac Doppler Indications:     R94.31 Abnormal ECG  History:         Patient has no prior history of Echocardiogram examinations.                  Drug abuse.  Sonographer:     09/01/1996 RDCS (AE) Referring Phys:  Humphrey Rolls DIEGO F PABON Diagnosing Phys: [id number] MD IMPRESSIONS  1. Left ventricular ejection fraction, by estimation, is 60 to 65%. The left ventricle has normal function. The left ventricle has no regional wall motion abnormalities. Left ventricular diastolic parameters were normal.  2. Right ventricular systolic function is normal. The right ventricular size is normal. Tricuspid regurgitation signal is inadequate for assessing PA pressure.  3. The mitral valve is normal in  structure. No evidence of mitral valve regurgitation. No evidence of mitral stenosis.  4. The aortic valve is normal in structure. Aortic valve regurgitation is not visualized. No aortic stenosis is present.  5. The inferior vena cava is normal in size with greater than 50% respiratory variability, suggesting right atrial pressure of 3 mmHg. Conclusion(s)/Recommendation(s): Normal biventricular function without evidence of hemodynamically significant valvular heart disease. FINDINGS  Left Ventricle: Left ventricular ejection fraction, by estimation, is 60 to 65%. The left ventricle has normal function. The left ventricle has no regional wall motion abnormalities. The left ventricular internal cavity size was normal in size. There is  no left ventricular hypertrophy. Left ventricular diastolic parameters were normal. Right Ventricle: The right ventricular size is normal. No increase in right ventricular wall thickness. Right ventricular systolic function is normal. Tricuspid regurgitation signal is inadequate for assessing PA pressure. Left Atrium: Left atrial size was normal in size. Right Atrium: Right atrial size was normal in size. Pericardium: There is no evidence of pericardial effusion. Mitral Valve:  The mitral valve is normal in structure. Normal mobility of the mitral valve leaflets. No evidence of mitral valve regurgitation. No evidence of mitral valve stenosis. MV peak gradient, 2.8 mmHg. The mean mitral valve gradient is 1.0 mmHg. Tricuspid Valve: The tricuspid valve is normal in structure. Tricuspid valve regurgitation is not demonstrated. No evidence of tricuspid stenosis. Aortic Valve: The aortic valve is normal in structure. Aortic valve regurgitation is not visualized. No aortic stenosis is present. Aortic valve mean gradient measures 2.0 mmHg. Aortic valve peak gradient measures 4.3 mmHg. Aortic valve area, by VTI measures 2.48 cm. Pulmonic Valve: The pulmonic valve was normal in structure. Pulmonic valve regurgitation is not visualized. No evidence of pulmonic stenosis. Aorta: The aortic root is normal in size and structure. Venous: The inferior vena cava is normal in size with greater than 50% respiratory variability, suggesting right atrial pressure of 3 mmHg. IAS/Shunts: No atrial level shunt detected by color flow Doppler.  LEFT VENTRICLE PLAX 2D LVIDd:         4.27 cm  Diastology LVIDs:         2.50 cm  LV e' lateral:   17.00 cm/s LV PW:         0.73 cm  LV E/e' lateral: 3.2 LV IVS:        0.45 cm  LV e' medial:    10.90 cm/s LVOT diam:     1.90 cm  LV E/e' medial:  5.0 LV SV:         44 LV SV Index:   35 LVOT Area:     2.84 cm  RIGHT VENTRICLE RV Basal diam:  2.05 cm LEFT ATRIUM             Index       RIGHT ATRIUM          Index LA diam:        2.50 cm 1.98 cm/m  RA Area:     8.63 cm LA Vol (A2C):   19.3 ml 15.26 ml/m RA Volume:   16.10 ml 12.73 ml/m LA Vol (A4C):   18.2 ml 14.39 ml/m LA Biplane Vol: 19.9 ml 15.73 ml/m  AORTIC VALVE                   PULMONIC VALVE AV Area (Vmax):    2.38 cm    PV Vmax:       0.88 m/s AV Area (Vmean):  2.49 cm    PV Vmean:      60.700 cm/s AV Area (VTI):     2.48 cm    PV VTI:        0.164 m AV Vmax:           104.00 cm/s PV Peak grad:  3.1 mmHg AV  Vmean:          72.300 cm/s PV Mean grad:  2.0 mmHg AV VTI:            0.178 m AV Peak Grad:      4.3 mmHg AV Mean Grad:      2.0 mmHg LVOT Vmax:         87.20 cm/s LVOT Vmean:        63.400 cm/s LVOT VTI:          0.156 m LVOT/AV VTI ratio: 0.88  AORTA Ao Root diam: 2.70 cm MITRAL VALVE MV Area (PHT): 4.21 cm    SHUNTS MV Peak grad:  2.8 mmHg    Systemic VTI:  0.16 m MV Mean grad:  1.0 mmHg    Systemic Diam: 1.90 cm MV Vmax:       0.83 m/s MV Vmean:      53.2 cm/s MV Decel Time: 180 msec MV E velocity: 54.30 cm/s MV A velocity: 57.70 cm/s MV E/A ratio:  0.94 Lorine Bears MD Electronically signed by Lorine Bears MD Signature Date/Time: 05/09/2019/1:04:25 PM    Final     Assessment/Plan: 2 Days Post-Op   Principal Problem:   Abscess-multpile sites Active Problems:   History of drug use   Anxiety   Sepsis (HCC)   Opiate abuse, continuous (HCC)   Methamphetamine abuse (HCC)  Patient doing well following bedside I&D of her right ring finger.  Continue IV antibiotics.  Continue elevation of both hands.    Juanell Fairly , MD 05/09/2019, 3:54 PM

## 2019-05-09 NOTE — Consult Note (Addendum)
NAME: Jill Shepherd  DOB: 1995-09-17  MRN: [id number]  Date/Time: 05/09/2019 2:07 PM  REQUESTING PROVIDER: Dr.PAtel Subjective:  REASON FOR CONSULT: MRSA abscesses ?chart reviewed-some history from patient Jill Shepherd is a 24 y.o. 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 In the ED vitals were temp 98.6, BP 120/81, Pulse 100 and RR 20 2 sets of blood culture is negative The left axillary abscess was I/D and also had excisional debridement by surgeon on 4/10 and culture is MRSA. Seen by ortho surgeon for the finger abscesses and underwent bed side I/D of the ring finger abscess and had xray which did not show any osteo She is currently on IV vanco and zosyn She has no h/o endocarditis  Past Medical History:  Diagnosis Date  . Drug abuse (HCC)   . Headache   . SAB (spontaneous abortion) 01/19/2014   stillborn at 20.4 wks    Past Surgical History:  Procedure Laterality Date  . INCISION AND DRAINAGE ABSCESS Left 05/07/2019   Procedure: INCISION AND DRAINAGE ABSCESS;  Surgeon: Leafy Ro, MD;  Location: [facility] ORS;  Service: General;  Laterality: Left;    Social History   Socioeconomic History  . Marital status: Single    Spouse name: Not on file  . Number of children: Not on file  . Years of education: Not on file  . Highest education level: Not on file  Occupational History    Employer: [employer]  Tobacco Use  . Smoking status: Never Smoker  . Smokeless tobacco: Never Used  Substance and Sexual Activity  . Alcohol use: No    Alcohol/week: 0.0 standard drinks  . Drug use: Yes    Types: Other-see comments, Amphetamines, Fentanyl, Heroin    Comment: subutex tx  . Sexual  activity: Not Currently    Partners: Male    Birth control/protection: None  Other Topics Concern  . Not on file  Social History Narrative  . Not on file   Social Determinants of Health   Financial Resource Strain:   . Difficulty of Paying Living Expenses:   Food Insecurity:   . Worried About Programme researcher, broadcasting/film/video in the Last Year:   . Barista in the Last Year:   Transportation Needs:   . Freight forwarder (Medical):   Marland Kitchen Lack of Transportation (Non-Medical):   Physical Activity:   . Days of Exercise per Week:   . Minutes of Exercise per Session:   Stress:   . Feeling of Stress :   Social Connections:   . Frequency of Communication with Friends and Family:   . Frequency of Social Gatherings with Friends and Family:   . Attends Religious Services:   . Active Member of Clubs or Organizations:   . Attends Banker Meetings:   Marland Kitchen Marital Status:   Intimate Partner Violence:   . Fear of Current or Ex-Partner:   . Emotionally Abused:   Marland Kitchen Physically Abused:   . Sexually Abused:     Family History  Problem Relation Age of Onset  . Hypertension Father   . Hypertension Paternal Grandfather    No Known Allergies  ?  Current Facility-Administered Medications  Medication Dose Route Frequency Provider Last Rate Last Admin  . acetaminophen (TYLENOL) tablet 650 mg  650 mg Oral Q6H PRN Pabon, Diego F, MD      . ceFEPIme (MAXIPIME) 2 g in sodium chloride 0.9 % 100 mL IVPB  2 g Intravenous Q8H Jules Husbands, MD   Stopped at 05/09/19 0559  . enoxaparin (LOVENOX) injection 30 mg  30 mg Subcutaneous Q24H Fritzi Mandes, MD   30 mg at 05/08/19 2127  . feeding supplement (ENSURE ENLIVE) (ENSURE ENLIVE) liquid 237 mL  237 mL Oral BID BM Fritzi Mandes, MD      . hydrOXYzine (VISTARIL) injection 25 mg  25 mg Intramuscular Q8H PRN Pabon, Diego F, MD   25 mg at 05/09/19 1150  . ibuprofen (ADVIL) tablet 400 mg  400 mg Oral Q6H PRN Pabon, Diego F, MD      . ketorolac (TORADOL) 30  MG/ML injection 30 mg  30 mg Intravenous Q6H PRN Fritzi Mandes, MD   30 mg at 05/09/19 1148  . lidocaine-prilocaine (EMLA) cream   Topical Once Pabon, [place] F, MD      . ondansetron (ZOFRAN) injection 4 mg  4 mg Intravenous Q8H PRN Pabon, Diego F, MD      . QUEtiapine (SEROQUEL) tablet 100 mg  100 mg Oral QHS Money, Travis B, FNP   100 mg at 05/08/19 2124  . vancomycin (VANCOCIN) IVPB 750 mg/150 ml premix  750 mg Intravenous Q24H Fritzi Mandes, MD   Stopped at 05/09/19 1029     Abtx:  Anti-infectives (From admission, onward)   Start     Dose/Rate Route Frequency Ordered Stop   05/09/19 0800  vancomycin (VANCOREADY) IVPB 750 mg/150 mL  Status:  Discontinued     750 mg 150 mL/hr over 60 Minutes Intravenous Every 24 hours 05/08/19 1542 05/08/19 1544   05/09/19 0800  vancomycin (VANCOCIN) IVPB 750 mg/150 ml premix     750 mg 150 mL/hr over 60 Minutes Intravenous Every 24 hours 05/08/19 1544     05/07/19 0800  vancomycin (VANCOREADY) IVPB 750 mg/150 mL  Status:  Discontinued     750 mg 150 mL/hr over 60 Minutes Intravenous Every 24 hours 05/06/19 1133 05/08/19 1542   05/06/19 1400  ceFEPIme (MAXIPIME) 2 g in sodium chloride 0.9 % 100 mL IVPB     2 g 200 mL/hr over 30 Minutes Intravenous Every 8 hours 05/06/19 1129     05/06/19 1200  vancomycin (VANCOREADY) IVPB 750 mg/150 mL  Status:  Discontinued     750 mg 150 mL/hr over 60 Minutes Intravenous Every 24 hours 05/06/19 1129 05/06/19 1133   05/06/19 0345  ceFEPIme (MAXIPIME) 1 g in sodium chloride 0.9 % 100 mL IVPB     1 g 200 mL/hr over 30 Minutes Intravenous  Once 05/06/19 0333 05/06/19 0613   05/06/19 0345  vancomycin (VANCOCIN) IVPB 1000 mg/200 mL premix     1,000 mg 200 mL/hr over 60 Minutes Intravenous  Once 05/06/19 0333 05/06/19 0448     Pt a reluctant historian REVIEW OF SYSTEMS:  Const: negative fever, negative chills, negative weight loss Eyes: negative diplopia or visual changes, negative eye pain ENT: negative coryza, negative  sore throat Resp: negative cough, hemoptysis, dyspnea Cards: negative for chest pain, has palpitations, lower extremity edema GU: negative for frequency, dysuria and hematuria GI: Negative for abdominal pain, diarrhea, bleeding, constipation Skin: as above Heme: negative for easy bruising and gum/nose bleeding MS: weakness, generalized  Neurolo: headache  Psych:was agitated yesterday as per the chart   Endocrine: negative for thyroid, diabetes Allergy/Immunology- negative for any medication or food allergies ?  Objective:  VITALS:  BP 111/73   Pulse 89   Temp 98.4 F (36.9 C) (Temporal)   Resp 16   Ht 5' (1.524 m)   Wt 36.3 kg   SpO2 98%   BMI 15.63 kg/m  PHYSICAL EXAM:  General: awake, no distress, thin built Head: Normocephalic, without obvious abnormality, atraumatic. Eyes: Conjunctivae clear, anicteric sclerae. Pupils are equal ENT Nares normal. No drainage or sinus tenderness. Lips, mucosa, and tongue normal. No Thrush Neck: Supple, symmetrical, no adenopathy, thyroid: non tender no carotid bruit and no JVD. Back: No CVA tenderness. Lungs: Clear to auscultation bilaterally. No Wheezing or Rhonchi. No rales. Heart: Regular rate and rhythm, no murmur, rub or gallop. Abdomen: Soft, non-tender,not distended. Bowel sounds normal. No masses Extremities: both thumbs paranychia   rt ring finger pulp infection No splinter hemorrhage           Left axillary abscess, gluteal abscess covered with dressing No rashes or lesions. Or bruising Lymph: Cervical, supraclavicular normal. Neurologic: Grossly non-focal Pertinent Labs Lab Results CBC    Component Value Date/Time   WBC 7.3 05/07/2019 0720   RBC 3.96 05/07/2019 0720   HGB 11.6 (L) 05/07/2019 0720   HGB 10.7 (L) 05/05/2017 1327   HCT 34.8 (L) 05/07/2019 0720   HCT 31.4 (L) 05/05/2017 1327   PLT 280 05/07/2019 0720   PLT 177 05/05/2017 1327   MCV 87.9 05/07/2019 0720   MCV 88 05/05/2017 1327   MCV 90  01/19/2014 0102   MCH 29.3 05/07/2019 0720   MCHC 33.3 05/07/2019 0720   RDW 14.6 05/07/2019 0720   RDW 13.5 05/05/2017 1327   RDW 14.2 01/19/2014 0102   LYMPHSABS 2.6 03/26/2019 2250   LYMPHSABS 1.4 12/12/2016 0936   LYMPHSABS 1.3 01/19/2014 0102   MONOABS 1.2 (H) 03/26/2019 2250   MONOABS 0.7 01/19/2014 0102   EOSABS 0.0 03/26/2019 2250   EOSABS 0.1 12/12/2016 0936   EOSABS 0.0 01/19/2014 0102   BASOSABS 0.0 03/26/2019 2250   BASOSABS 0.0 12/12/2016 0936   BASOSABS 0.0 01/19/2014 0102    CMP Latest Ref Rng & Units 05/09/2019 05/08/2019 05/07/2019  Glucose 70 - 99 mg/dL 93 - 540(J100(H)  BUN 6 - 20 mg/dL 16 - 12  Creatinine 8.110.44 - 1.00 mg/dL 9.140.52 7.820.49 9.560.44  Sodium 135 - 145 mmol/L 138 - 137  Potassium 3.5 - 5.1 mmol/L 3.5 - 4.0  Chloride 98 - 111 mmol/L 108 - 111  CO2 22 - 32 mmol/L 23 - 22  Calcium 8.9 - 10.3 mg/dL 2.1(H8.6(L) - 8.3(L)  Total Protein 6.5 - 8.1 g/dL - - -  Total Bilirubin 0.3 - 1.2 mg/dL - - -  Alkaline Phos 38 - 126 U/L - - -  AST 15 - 41 U/L - - -  ALT 0 - 44 U/L - - -      Microbiology: Recent Results (from the past 240 hour(s))  Blood culture (routine x 2)     Status: None (Preliminary result)   Collection Time: 05/06/19  4:03 AM   Specimen: BLOOD  Result Value Ref Range Status   Specimen Description BLOOD RIGHT ANTECUBITAL  Final   Special Requests   Final    BOTTLES DRAWN AEROBIC AND ANAEROBIC Blood Culture adequate volume   Culture   Final    NO GROWTH 3 DAYS Performed at  [facility], [address] [place], [place] [address]    Report Status PENDING  Incomplete  Blood culture (routine x 2)     Status: None (Preliminary result)   Collection Time: 05/06/19  4:03 AM   Specimen: BLOOD  Result Value Ref Range Status   Specimen Description BLOOD LEFT ANTECUBITAL  Final   Special Requests   Final    BOTTLES DRAWN AEROBIC AND ANAEROBIC Blood Culture adequate volume   Culture   Final    NO GROWTH 3 DAYS Performed at [facility],  [address], [place], [place] [address]    Report Status PENDING  Incomplete  Urine culture     Status: None   Collection Time: 05/06/19  4:31 AM   Specimen: In/Out Cath Urine  Result Value Ref Range Status   Specimen Description   Final    IN/OUT CATH URINE Performed at [facility], [address], [place], [place] [address]    Special Requests   Final    NONE Performed at [facility], [address], [place], [place] [address]    Culture   Final    NO GROWTH Performed at [facility], [address] [place], [place] [address]    Report Status 05/07/2019 FINAL  Final  Respiratory Panel by RT PCR (Flu A&B, Covid) - Nasopharyngeal Swab     Status: None   Collection Time: 05/06/19  1:35 PM   Specimen: Nasopharyngeal Swab  Result Value Ref Range Status   SARS Coronavirus 2 by RT PCR NEGATIVE NEGATIVE Final    Comment: (NOTE) SARS-CoV-2 target nucleic acids are NOT DETECTED. The SARS-CoV-2 RNA is generally detectable in upper respiratoy specimens during the acute phase of infection. The lowest concentration of SARS-CoV-2 viral copies this assay can detect is 131 copies/mL. A negative result does not preclude SARS-Cov-2 infection and should not be used as the sole basis for treatment or other patient management decisions. A negative result may occur with  improper specimen collection/handling, submission of specimen other than nasopharyngeal swab, presence of viral mutation(s) within the areas targeted by this assay, and inadequate number of viral copies (<131 copies/mL). A negative result must be combined with clinical observations, patient history, and epidemiological information. The expected result is Negative. Fact Sheet for Patients:  https://www.moore.com/ Fact Sheet for Healthcare Providers:  https://www.young.biz/ This test is not yet ap proved or cleared by the [place] FDA and  has been  authorized for detection and/or diagnosis of SARS-CoV-2 by FDA under an Emergency Use Authorization (EUA). This EUA will remain  in effect (meaning this test can be used) for the duration of the COVID-19 declaration under Section 564(b)(1) of the Act, 21 U.S.C. section 360bbb-3(b)(1), unless the authorization is terminated or revoked sooner.    Influenza A by PCR NEGATIVE NEGATIVE Final   Influenza B by PCR NEGATIVE NEGATIVE Final    Comment: (NOTE) The Xpert Xpress SARS-CoV-2/FLU/RSV assay is intended as an aid in  the diagnosis of influenza from Nasopharyngeal swab specimens and  should not be used as a sole basis for treatment. Nasal washings and  aspirates are unacceptable for Xpert Xpress SARS-CoV-2/FLU/RSV  testing. Fact Sheet for Patients: https://www.moore.com/ Fact Sheet for Healthcare Providers: https://www.young.biz/ This test is not yet approved or cleared by the [place] FDA and  has been authorized for detection and/or diagnosis of SARS-CoV-2 by  FDA under an Emergency Use Authorization (EUA). This EUA will remain  in effect (meaning this  test can be used) for the duration of the  Covid-19 declaration under Section 564(b)(1) of the Act, 21  U.S.C. section 360bbb-3(b)(1), unless the authorization is  terminated or revoked. Performed at [facility], [address] [place], [place] [address]   Aerobic/Anaerobic Culture (surgical/deep wound)     Status: None (Preliminary result)   Collection Time: 05/07/19  9:03 AM   Specimen: PATH Other; Tissue  Result Value Ref Range Status   Specimen Description   Final    WOUND Performed at [facility], [address], [place], [place] [address]    Special Requests   Final    NONE Performed at [facility], [address] [place], [place] [address]    Gram Stain   Final    NO WBC SEEN NO ORGANISMS SEEN Performed at [facility], [address], [place], [place] [address]    Culture   Final    RARE METHICILLIN RESISTANT STAPHYLOCOCCUS AUREUS NO ANAEROBES ISOLATED; CULTURE IN PROGRESS FOR 5 DAYS    Report Status PENDING  Incomplete   Organism ID, Bacteria METHICILLIN RESISTANT STAPHYLOCOCCUS AUREUS  Final      Susceptibility   Methicillin resistant staphylococcus aureus - MIC*    CIPROFLOXACIN 1 SENSITIVE Sensitive     ERYTHROMYCIN >=8 RESISTANT Resistant     GENTAMICIN <=0.5 SENSITIVE Sensitive     OXACILLIN >=4 RESISTANT Resistant     TETRACYCLINE <=1 SENSITIVE Sensitive     VANCOMYCIN 1 SENSITIVE Sensitive     TRIMETH/SULFA <=10 SENSITIVE Sensitive     CLINDAMYCIN <=0.25 SENSITIVE Sensitive     RIFAMPIN <=0.5 SENSITIVE Sensitive     Inducible Clindamycin NEGATIVE Sensitive     * RARE METHICILLIN RESISTANT STAPHYLOCOCCUS AUREUS  Aerobic/Anaerobic Culture (surgical/deep wound)     Status: None (Preliminary result)   Collection Time: 05/07/19  9:30 AM   Specimen: PATH Other; Tissue  Result Value Ref Range Status   Specimen Description   Final    WOUND Performed at [facility], [address] [place], [place] [address]    Special Requests   Final    NONE Performed at [facility], [address] [place], [place] [address]    Gram Stain   Final    RARE WBC PRESENT,BOTH PMN AND MONONUCLEAR NO ORGANISMS SEEN Performed at [facility], [address] [place], [place] [address]    Culture   Final    RARE METHICILLIN RESISTANT STAPHYLOCOCCUS AUREUS NO ANAEROBES ISOLATED; CULTURE IN PROGRESS FOR 5 DAYS    Report Status PENDING  Incomplete   Organism ID, Bacteria METHICILLIN RESISTANT STAPHYLOCOCCUS AUREUS  Final      Susceptibility   Methicillin resistant staphylococcus aureus - MIC*    CIPROFLOXACIN 1 SENSITIVE Sensitive     ERYTHROMYCIN >=8 RESISTANT Resistant     GENTAMICIN <=0.5 SENSITIVE Sensitive     OXACILLIN >=4 RESISTANT Resistant     TETRACYCLINE <=1 SENSITIVE Sensitive      VANCOMYCIN 1 SENSITIVE Sensitive     TRIMETH/SULFA <=10 SENSITIVE Sensitive     CLINDAMYCIN <=0.25 SENSITIVE Sensitive     RIFAMPIN <=0.5 SENSITIVE Sensitive     Inducible Clindamycin NEGATIVE Sensitive     * RARE METHICILLIN RESISTANT STAPHYLOCOCCUS AUREUS    IMAGING RESULTS: 2 d echo- valves normal CXR No infiltrate  I have personally reviewed the films ? Impression/Recommendation ? ?Multiple digits  Abscesses in a patient who is IVDA_ one might wonder whether this is  infected oslers nodes , but she bites her nails which has put her at risk. Especially the thumbs. 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 Will get MRSA nares  will DC zosyn/cefepime Will change vanco IV to PO linezolid as the reagent for checking vanco level is in short supply ? ___IVDA- HIV neg will check HEpC ________________________________________________ Discussed with patient, requesting provider

## 2019-05-10 LAB — HEPATITIS PANEL, ACUTE
HCV Ab: REACTIVE — AB
Hep A IgM: NONREACTIVE
Hep B C IgM: NONREACTIVE
Hepatitis B Surface Ag: NONREACTIVE

## 2019-05-10 LAB — SURGICAL PATHOLOGY

## 2019-05-10 MED ORDER — IBUPROFEN 400 MG PO TABS: 400.0000 mg | ORAL_TABLET | Freq: Three times a day (TID) | ORAL | 0 refills | Status: DC | PRN

## 2019-05-10 MED ORDER — SULFAMETHOXAZOLE-TRIMETHOPRIM 800-160 MG PO TABS
1.0000 | ORAL_TABLET | Freq: Two times a day (BID) | ORAL | Status: DC
  Administered 2019-05-10: 1 via ORAL
  Filled 2019-05-10 (×2): qty 1

## 2019-05-10 MED ORDER — QUETIAPINE FUMARATE 25 MG PO TABS
25.0000 mg | ORAL_TABLET | Freq: Every day | ORAL | Status: DC
  Filled 2019-05-10: qty 1

## 2019-05-10 MED ORDER — ENSURE ENLIVE PO LIQD: 237.0000 mL | Freq: Two times a day (BID) | ORAL | Status: DC

## 2019-05-10 MED ORDER — HYDROXYZINE HCL 25 MG PO TABS
25.0000 mg | ORAL_TABLET | Freq: Three times a day (TID) | ORAL | Status: DC | PRN
  Administered 2019-05-10: 25 mg via ORAL
  Filled 2019-05-10 (×2): qty 1

## 2019-05-10 MED ORDER — KETOROLAC TROMETHAMINE 30 MG/ML IJ SOLN
INTRAMUSCULAR | Status: AC
  Administered 2019-05-10: 06:00:00 30 mg via INTRAVENOUS
  Filled 2019-05-10: qty 1

## 2019-05-10 MED ORDER — SULFAMETHOXAZOLE-TRIMETHOPRIM 800-160 MG PO TABS: 1.0000 | ORAL_TABLET | Freq: Two times a day (BID) | ORAL | 0 refills | Status: DC

## 2019-05-10 MED ORDER — HYDROXYZINE HCL 25 MG PO TABS: 25.0000 mg | ORAL_TABLET | Freq: Three times a day (TID) | ORAL | 0 refills | Status: DC | PRN

## 2019-05-10 NOTE — Discharge Summary (Signed)
[facility] [place]   PATIENT NAME: Jill Shepherd    MR#:  [id number]  DATE OF BIRTH:  1996/01/23  DATE OF ADMISSION:  05/06/2019 ADMITTING PHYSICIAN: Lorretta Harp, MD  DATE OF DISCHARGE: 05/10/2019  PRIMARY CARE PHYSICIAN: Patient, No Pcp Per    ADMISSION DIAGNOSIS:  Abscess [L02.91] Paronychia of thumb, unspecified laterality [L03.019] Sepsis (HCC) [A41.9] Sepsis without acute organ dysfunction, due to unspecified organism (HCC) [A41.9]  DISCHARGE DIAGNOSIS:  Sepsis due to MRSA abscess on admisison--resolved Multiple MRSA abscess with I and of of left axillary, right buttock and right ring finger IVDA Polysubstance drug abuse SECONDARY DIAGNOSIS:   Past Medical History:  Diagnosis Date  . Drug abuse (HCC)   . Headache   . SAB (spontaneous abortion) 01/19/2014   stillborn at 20.4 wks    HOSPITAL COURSE:  Jill Cordle [name] a 24 y.o.femalewith medical history significant ofanxiety, polysubstance abuse, IV drug use, who presents with multiple abscesses. Pt states that shehas multiple abscesses which has been going on in the past few days, including left axillaryabscess, right buttock abscess and bilateral thumb abscess.  Sepsis due to abscess-multpile sites:POA --now improved -pt has abscessinright buttock, left axilla,bilateral thumb paronychia. -4/11 pt has new abscess on left 4th finger--s/p I and D by dr Real Cons -Lactic acid elevated at 2.5--now normal -4/9 General surgeon, Dr. Everlene Farrier --did I and D left axillary abscess  - Empiric antimicrobial treatment with vancomycinandcefepime - deep wound cultures-- MRSA + -ID consult appreciated recommends bactrim DS for 10 days - PRN Zofran for nausea -Pain control: As needed Tylenol, ibuprofen, IV ketorolac (NO NARCOTICS GIVEN SUBSTANCE ABUSE) - Blood cultures x 2-->so far negative - TTE--no valvular abnormality  History of drug use -didcounseling about importance of quitting  substance abuse -patient recently came to the ER on 21st of March. She had used heroin, methamphetamine and fentanyl  Anxiety: -As needed hydroxyzine -psych consultation for anxiety and substance abuse--appreciated. Prn vistaril -d/ced seroquel--pt cannot afford it  Pt to meet with DSS prior to d/c. CSW to get po bactrim from [facility]  Procedures:I and D left axilla, right buttock, left ring finger Family communication :none Consults :Surgery, psych, ortho Discharge Disposition :from home- Ok form ortho and surgery standpoint for d/c  CODE STATUS: full DVT Prophylaxis :lovenox  CONSULTS OBTAINED:  Treatment Team:  Lynn Ito, MD Cindy Hazy, MD  DRUG ALLERGIES:  No Known Allergies  DISCHARGE MEDICATIONS:   Allergies as of 05/10/2019   No Known Allergies     Medication List    TAKE these medications   hydrOXYzine 25 MG tablet Commonly known as: ATARAX/VISTARIL Take 1 tablet (25 mg total) by mouth 3 (three) times daily as needed for anxiety.   ibuprofen 400 MG tablet Commonly known as: ADVIL Take 1 tablet (400 mg total) by mouth every 8 (eight) hours as needed for moderate pain.   sulfamethoxazole-trimethoprim 800-160 MG tablet Commonly known as: BACTRIM DS Take 1 tablet by mouth every 12 (twelve) hours for 10 days.       If you experience worsening of your admission symptoms, develop shortness of breath, life threatening emergency, suicidal or homicidal thoughts you must seek medical attention immediately by calling 911 or calling your MD immediately  if symptoms less severe.  You Must read complete instructions/literature along with all the possible adverse reactions/side effects for all the Medicines you take and that have been prescribed to you. Take any new Medicines after you have completely understood and  accept all the possible adverse reactions/side effects.   Please note  You were cared for by a hospitalist during your hospital  stay. If you have any questions about your discharge medications or the care you received while you were in the hospital after you are discharged, you can call the unit and asked to speak with the hospitalist on call if the hospitalist that took care of you is not available. Once you are discharged, your primary care physician will handle any further medical issues. Please note that NO REFILLS for any discharge medications will be authorized once you are discharged, as it is imperative that you return to your primary care physician (or establish a relationship with a primary care physician if you do not have one) for your aftercare needs so that they can reassess your need for medications and monitor your lab values. Today   SUBJECTIVE   No new complaints  VITAL SIGNS:  Blood pressure 122/75, pulse (!) 106, temperature 97.8 F (36.6 C), resp. rate 20, height 5' (1.524 m), weight 36.3 kg, SpO2 100 %, currently breastfeeding.  I/O:    Intake/Output Summary (Last 24 hours) at 05/10/2019 1403 Last data filed at 05/10/2019 1200 Gross per 24 hour  Intake 700 ml  Output 1200 ml  Net -500 ml    PHYSICAL EXAMINATION:  GENERAL:  24 y.o.-year-old patient lying in the bed with no acute distress.  EYES: Pupils equal, round, reactive to light and accommodation. No scleral icterus.  HEENT: Head atraumatic, normocephalic. Oropharynx and nasopharynx clear.  NECK:  Supple, no jugular venous distention. No thyroid enlargement, no tenderness.  LUNGS: Normal breath sounds bilaterally, no wheezing, rales,rhonchi or crepitation. No use of accessory muscles of respiration.  CARDIOVASCULAR: S1, S2 normal. No murmurs, rubs, or gallops.  ABDOMEN: Soft, non-tender, non-distended. Bowel sounds present. No organomegaly or mass.  EXTREMITIES: No pedal edema, cyanosis, or clubbing. Bilateral thumb and left ring finger dressing+ Left axillary dressing+ NEUROLOGIC: Cranial nerves II through XII are intact. Muscle  strength 5/5 in all extremities. Sensation intact. Gait not checked.  PSYCHIATRIC: The patient is alert and oriented x 3.  SKIN: No obvious rash, lesion, or ulcer.   DATA REVIEW:   CBC  Recent Labs  Lab 05/07/19 0720  WBC 7.3  HGB 11.6*  HCT 34.8*  PLT 280    Chemistries  Recent Labs  Lab 05/05/19 2258 05/07/19 0720 05/09/19 0640  NA 137   < > 138  K 3.6   < > 3.5  CL 102   < > 108  CO2 22   < > 23  GLUCOSE 135*   < > 93  BUN 21*   < > 16  CREATININE 0.51   < > 0.52  CALCIUM 9.4   < > 8.6*  AST 37  --   --   ALT 35  --   --   ALKPHOS 89  --   --   BILITOT 0.7  --   --    < > = values in this interval not displayed.    Microbiology Results   Recent Results (from the past 240 hour(s))  Blood culture (routine x 2)     Status: None (Preliminary result)   Collection Time: 05/06/19  4:03 AM   Specimen: BLOOD  Result Value Ref Range Status   Specimen Description BLOOD RIGHT ANTECUBITAL  Final   Special Requests   Final    BOTTLES DRAWN AEROBIC AND ANAEROBIC Blood Culture adequate volume  Culture   Final    NO GROWTH 4 DAYS Performed at [facility], [address] [place], [place] [address]    Report Status PENDING  Incomplete  Blood culture (routine x 2)     Status: None (Preliminary result)   Collection Time: 05/06/19  4:03 AM   Specimen: BLOOD  Result Value Ref Range Status   Specimen Description BLOOD LEFT ANTECUBITAL  Final   Special Requests   Final    BOTTLES DRAWN AEROBIC AND ANAEROBIC Blood Culture adequate volume   Culture   Final    NO GROWTH 4 DAYS Performed at [facility], [address], [place], [place] [address]    Report Status PENDING  Incomplete  Urine culture     Status: None   Collection Time: 05/06/19  4:31 AM   Specimen: In/Out Cath Urine  Result Value Ref Range Status   Specimen Description   Final    IN/OUT CATH URINE Performed at [facility], [address], [place], [place] [address]     Special Requests   Final    NONE Performed at [facility], [address] [place], [place] [address]    Culture   Final    NO GROWTH Performed at [facility], [address], [place], [place] [address]    Report Status 05/07/2019 FINAL  Final  Respiratory Panel by RT PCR (Flu A&B, Covid) - Nasopharyngeal Swab     Status: None   Collection Time: 05/06/19  1:35 PM   Specimen: Nasopharyngeal Swab  Result Value Ref Range Status   SARS Coronavirus 2 by RT PCR NEGATIVE NEGATIVE Final    Comment: (NOTE) SARS-CoV-2 target nucleic acids are NOT DETECTED. The SARS-CoV-2 RNA is generally detectable in upper respiratoy specimens during the acute phase of infection. The lowest concentration of SARS-CoV-2 viral copies this assay can detect is 131 copies/mL. A negative result does not preclude SARS-Cov-2 infection and should not be used as the sole basis for treatment or other patient management decisions. A negative result may occur with  improper specimen collection/handling, submission of specimen other than nasopharyngeal swab, presence of viral mutation(s) within the areas targeted by this assay, and inadequate number of viral copies (<131 copies/mL). A negative result must be combined with clinical observations, patient history, and epidemiological information. The expected result is Negative. Fact Sheet for Patients:  https://www.moore.com/ Fact Sheet for Healthcare Providers:  https://www.young.biz/ This test is not yet ap proved or cleared by the [place] FDA and  has been authorized for detection and/or diagnosis of SARS-CoV-2 by FDA under an Emergency Use Authorization (EUA). This EUA will remain  in effect (meaning this test can be used) for the duration of the COVID-19 declaration under Section 564(b)(1) of the Act, 21 U.S.C. section 360bbb-3(b)(1), unless the authorization is terminated or revoked sooner.     Influenza A by PCR NEGATIVE NEGATIVE Final   Influenza B by PCR NEGATIVE NEGATIVE Final    Comment: (NOTE) The Xpert Xpress SARS-CoV-2/FLU/RSV assay is intended as an aid in  the diagnosis of influenza from Nasopharyngeal swab specimens and  should not be used as a sole basis for treatment. Nasal washings and  aspirates are unacceptable for Xpert Xpress SARS-CoV-2/FLU/RSV  testing. Fact Sheet for Patients: https://www.moore.com/ Fact Sheet for Healthcare Providers: https://www.young.biz/ This test is not yet approved or cleared by the [place] FDA and  has been authorized for detection and/or diagnosis of SARS-CoV-2 by  FDA under an  Emergency Use Authorization (EUA). This EUA will remain  in effect (meaning this test can be used) for the duration of the  Covid-19 declaration under Section 564(b)(1) of the Act, 21  U.S.C. section 360bbb-3(b)(1), unless the authorization is  terminated or revoked. Performed at [facility], [address] [place], [place] [address]   Aerobic/Anaerobic Culture (surgical/deep wound)     Status: None (Preliminary result)   Collection Time: 05/07/19  9:03 AM   Specimen: PATH Other; Tissue  Result Value Ref Range Status   Specimen Description   Final    WOUND Performed at [facility], [address] [place], [place] [address]    Special Requests   Final    NONE Performed at [facility], [address] [place], [place] [address]    Gram Stain   Final    NO WBC SEEN NO ORGANISMS SEEN Performed at [facility], [place] [address] [place], [place] [address]    Culture   Final    RARE METHICILLIN RESISTANT STAPHYLOCOCCUS AUREUS NO ANAEROBES ISOLATED; CULTURE IN PROGRESS FOR 5 DAYS    Report Status PENDING  Incomplete   Organism ID, Bacteria METHICILLIN RESISTANT STAPHYLOCOCCUS AUREUS  Final      Susceptibility   Methicillin resistant staphylococcus aureus - MIC*     CIPROFLOXACIN 1 SENSITIVE Sensitive     ERYTHROMYCIN >=8 RESISTANT Resistant     GENTAMICIN <=0.5 SENSITIVE Sensitive     OXACILLIN >=4 RESISTANT Resistant     TETRACYCLINE <=1 SENSITIVE Sensitive     VANCOMYCIN 1 SENSITIVE Sensitive     TRIMETH/SULFA <=10 SENSITIVE Sensitive     CLINDAMYCIN <=0.25 SENSITIVE Sensitive     RIFAMPIN <=0.5 SENSITIVE Sensitive     Inducible Clindamycin NEGATIVE Sensitive     * RARE METHICILLIN RESISTANT STAPHYLOCOCCUS AUREUS  Aerobic/Anaerobic Culture (surgical/deep wound)     Status: None (Preliminary result)   Collection Time: 05/07/19  9:30 AM   Specimen: PATH Other; Tissue  Result Value Ref Range Status   Specimen Description   Final    WOUND Performed at [facility], [address] [place], [place] [address]    Special Requests   Final    NONE Performed at [facility], [address] [place], [place] [address]    Gram Stain   Final    RARE WBC PRESENT,BOTH PMN AND MONONUCLEAR NO ORGANISMS SEEN Performed at [facility], [place] [address] [place], [place] [address]    Culture   Final    RARE METHICILLIN RESISTANT STAPHYLOCOCCUS AUREUS NO ANAEROBES ISOLATED; CULTURE IN PROGRESS FOR 5 DAYS    Report Status PENDING  Incomplete   Organism ID, Bacteria METHICILLIN RESISTANT STAPHYLOCOCCUS AUREUS  Final      Susceptibility   Methicillin resistant staphylococcus aureus - MIC*    CIPROFLOXACIN 1 SENSITIVE Sensitive     ERYTHROMYCIN >=8 RESISTANT Resistant     GENTAMICIN <=0.5 SENSITIVE Sensitive     OXACILLIN >=4 RESISTANT Resistant     TETRACYCLINE <=1 SENSITIVE Sensitive     VANCOMYCIN 1 SENSITIVE Sensitive     TRIMETH/SULFA <=10 SENSITIVE Sensitive     CLINDAMYCIN <=0.25 SENSITIVE Sensitive     RIFAMPIN <=0.5 SENSITIVE Sensitive     Inducible Clindamycin NEGATIVE Sensitive     * RARE METHICILLIN RESISTANT STAPHYLOCOCCUS AUREUS  MRSA PCR Screening     Status: Abnormal   Collection Time: 05/09/19  5:06 PM    Specimen: Nasal Mucosa; Nasopharyngeal  Result  Value Ref Range Status   MRSA by PCR POSITIVE (A) NEGATIVE Final    Comment:        The GeneXpert MRSA Assay (FDA approved for NASAL specimens only), is one component of a comprehensive MRSA colonization surveillance program. It is not intended to diagnose MRSA infection nor to guide or monitor treatment for MRSA infections. RESULT CALLED TO, READ BACK BY AND VERIFIED WITH: VICKY MOORE  05/09/19 MJU Performed at [facility], [address] [place] [place], [place] [address]     RADIOLOGY:  ECHOCARDIOGRAM COMPLETE  Result Date: 05/09/2019    ECHOCARDIOGRAM REPORT   Patient Name:   Jill Shepherd Date of Exam: 05/09/2019 Medical Rec #:  [id number]      Height:       60.0 in Accession #:    [id number]     Weight:       80.0 lb Date of Birth:  02-24-1995       BSA:          1.265 m Patient Age:    23 years       BP:           107/62 mmHg Patient Gender: F              HR:           79 bpm. Exam Location:  [facility] Procedure: 2D Echo, Color Doppler and Cardiac Doppler Indications:     R94.31 Abnormal ECG  History:         Patient has no prior history of Echocardiogram examinations.                  Drug abuse.  Sonographer:     Humphrey Rolls RDCS (AE) Referring Phys:  [id number] DIEGO F PABON Diagnosing Phys: Lorine Bears MD IMPRESSIONS  1. Left ventricular ejection fraction, by estimation, is 60 to 65%. The left ventricle has normal function. The left ventricle has no regional wall motion abnormalities. Left ventricular diastolic parameters were normal.  2. Right ventricular systolic function is normal. The right ventricular size is normal. Tricuspid regurgitation signal is inadequate for assessing PA pressure.  3. The mitral valve is normal in structure. No evidence of mitral valve regurgitation. No evidence of mitral stenosis.  4. The aortic valve is normal in structure. Aortic valve regurgitation is not visualized. No aortic stenosis is present.  5.  The inferior vena cava is normal in size with greater than 50% respiratory variability, suggesting right atrial pressure of 3 mmHg. Conclusion(s)/Recommendation(s): Normal biventricular function without evidence of hemodynamically significant valvular heart disease. FINDINGS  Left Ventricle: Left ventricular ejection fraction, by estimation, is 60 to 65%. The left ventricle has normal function. The left ventricle has no regional wall motion abnormalities. The left ventricular internal cavity size was normal in size. There is  no left ventricular hypertrophy. Left ventricular diastolic parameters were normal. Right Ventricle: The right ventricular size is normal. No increase in right ventricular wall thickness. Right ventricular systolic function is normal. Tricuspid regurgitation signal is inadequate for assessing PA pressure. Left Atrium: Left atrial size was normal in size. Right Atrium: Right atrial size was normal in size. Pericardium: There is no evidence of pericardial effusion. Mitral Valve: The mitral valve is normal in structure. Normal mobility of the mitral valve leaflets. No evidence of mitral valve regurgitation. No evidence of mitral valve stenosis. MV peak gradient, 2.8 mmHg. The mean mitral valve gradient is 1.0 mmHg. Tricuspid Valve: The tricuspid valve is  normal in structure. Tricuspid valve regurgitation is not demonstrated. No evidence of tricuspid stenosis. Aortic Valve: The aortic valve is normal in structure. Aortic valve regurgitation is not visualized. No aortic stenosis is present. Aortic valve mean gradient measures 2.0 mmHg. Aortic valve peak gradient measures 4.3 mmHg. Aortic valve area, by VTI measures 2.48 cm. Pulmonic Valve: The pulmonic valve was normal in structure. Pulmonic valve regurgitation is not visualized. No evidence of pulmonic stenosis. Aorta: The aortic root is normal in size and structure. Venous: The inferior vena cava is normal in size with greater than 50%  respiratory variability, suggesting right atrial pressure of 3 mmHg. IAS/Shunts: No atrial level shunt detected by color flow Doppler.  LEFT VENTRICLE PLAX 2D LVIDd:         4.27 cm  Diastology LVIDs:         2.50 cm  LV e' lateral:   17.00 cm/s LV PW:         0.73 cm  LV E/e' lateral: 3.2 LV IVS:        0.45 cm  LV e' medial:    10.90 cm/s LVOT diam:     1.90 cm  LV E/e' medial:  5.0 LV SV:         44 LV SV Index:   35 LVOT Area:     2.84 cm  RIGHT VENTRICLE RV Basal diam:  2.05 cm LEFT ATRIUM             Index       RIGHT ATRIUM          Index LA diam:        2.50 cm 1.98 cm/m  RA Area:     8.63 cm LA Vol (A2C):   19.3 ml 15.26 ml/m RA Volume:   16.10 ml 12.73 ml/m LA Vol (A4C):   18.2 ml 14.39 ml/m LA Biplane Vol: 19.9 ml 15.73 ml/m  AORTIC VALVE                   PULMONIC VALVE AV Area (Vmax):    2.38 cm    PV Vmax:       0.88 m/s AV Area (Vmean):   2.49 cm    PV Vmean:      60.700 cm/s AV Area (VTI):     2.48 cm    PV VTI:        0.164 m AV Vmax:           104.00 cm/s PV Peak grad:  3.1 mmHg AV Vmean:          72.300 cm/s PV Mean grad:  2.0 mmHg AV VTI:            0.178 m AV Peak Grad:      4.3 mmHg AV Mean Grad:      2.0 mmHg LVOT Vmax:         87.20 cm/s LVOT Vmean:        63.400 cm/s LVOT VTI:          0.156 m LVOT/AV VTI ratio: 0.88  AORTA Ao Root diam: 2.70 cm MITRAL VALVE MV Area (PHT): 4.21 cm    SHUNTS MV Peak grad:  2.8 mmHg    Systemic VTI:  0.16 m MV Mean grad:  1.0 mmHg    Systemic Diam: 1.90 cm MV Vmax:       0.83 m/s MV Vmean:      53.2 cm/s MV Decel Time: 180 msec MV E  velocity: 54.30 cm/s MV A velocity: 57.70 cm/s MV E/A ratio:  0.94 Lorine Bears MD Electronically signed by Lorine Bears MD Signature Date/Time: 05/09/2019/1:04:25 PM    Final      CODE STATUS:     Code Status Orders  (From admission, onward)         Start     Ordered   05/06/19 1225  Full code  Continuous     05/06/19 1224        Code Status History    Date Active Date Inactive Code Status Order ID  Comments User Context   07/17/2017 1237 07/20/2017 0240 Full Code [id number]  Hildred Laser, MD Inpatient   07/17/2017 0512 07/17/2017 1237 Full Code [id number]  Kennith Gain, RN Inpatient   09/27/2015 0047 09/28/2015 1932 Full Code [id number]  Nadara Mustard, MD Inpatient   09/26/2015 1822 09/27/2015 0047 Full Code [id number]  Nadara Mustard, MD Inpatient   09/25/2015 1034 09/25/2015 1907 Full Code [id number]  Vena Austria, MD Inpatient   Advance Care Planning Activity       TOTAL TIME TAKING CARE OF THIS PATIENT: *40* minutes.    Enedina Finner M.D  [facility]    CC: Primary care physician; Patient, No Pcp Per

## 2019-05-10 NOTE — Progress Notes (Signed)
Subjective:  Patient was seen approximately at 1:00 this afternoon.  Nurse was at the bedside.  Patient was sitting upright in bed and denied any significant pain in her hands.  Objective:   VITALS:   Vitals:   05/09/19 2000 05/10/19 0000 05/10/19 0400 05/10/19 0745  BP: 103/82 116/81 111/71 122/75  Pulse: (!) 102 (!) 150 (!) 106 (!) 106  Resp: 20 (!) 24 14 20   Temp: 98.4 F (36.9 C) (!) 97.5 F (36.4 C) 97.9 F (36.6 C) 97.8 F (36.6 C)  TempSrc:      SpO2: 99% 98% 99% 100%  Weight:      Height:        PHYSICAL EXAM: Right hand: Patient's dressings over the thumb and ring finger were clean dry and intact.  Her hand was well-perfused.    Left hand: Patient's thumb demonstrates improved erythema and swelling.  She remains neurovascular intact.   LABS  Results for orders placed or performed during the hospital encounter of 05/06/19 (from the past 24 hour(s))  Hepatitis panel, acute     Status: Abnormal   Collection Time: 05/10/19  6:48 AM  Result Value Ref Range   Hepatitis B Surface Ag NON REACTIVE NON REACTIVE   HCV Ab Reactive (A) NON REACTIVE   Hep A IgM NON REACTIVE NON REACTIVE   Hep B C IgM NON REACTIVE NON REACTIVE    ECHOCARDIOGRAM COMPLETE  Result Date: 05/09/2019    ECHOCARDIOGRAM REPORT   Patient Name:   Jill Shepherd Date of Exam: 05/09/2019 Medical Rec #:  07/09/2019      Height:       60.0 in Accession #:    [id number]     Weight:       80.0 lb Date of Birth:  Jan 26, 1996       BSA:          1.265 m Patient Age:    23 years       BP:           107/62 mmHg Patient Gender: F              HR:           79 bpm. Exam Location:  [facility] Procedure: 2D Echo, Color Doppler and Cardiac Doppler Indications:     R94.31 Abnormal ECG  History:         Patient has no prior history of Echocardiogram examinations.                  Drug abuse.  Sonographer:     09/01/1996 RDCS (AE) Referring Phys:  Humphrey Rolls DIEGO F PABON Diagnosing Phys: [id number] MD IMPRESSIONS  1. Left  ventricular ejection fraction, by estimation, is 60 to 65%. The left ventricle has normal function. The left ventricle has no regional wall motion abnormalities. Left ventricular diastolic parameters were normal.  2. Right ventricular systolic function is normal. The right ventricular size is normal. Tricuspid regurgitation signal is inadequate for assessing PA pressure.  3. The mitral valve is normal in structure. No evidence of mitral valve regurgitation. No evidence of mitral stenosis.  4. The aortic valve is normal in structure. Aortic valve regurgitation is not visualized. No aortic stenosis is present.  5. The inferior vena cava is normal in size with greater than 50% respiratory variability, suggesting right atrial pressure of 3 mmHg. Conclusion(s)/Recommendation(s): Normal biventricular function without evidence of hemodynamically significant valvular heart disease. FINDINGS  Left Ventricle: Left ventricular ejection  fraction, by estimation, is 60 to 65%. The left ventricle has normal function. The left ventricle has no regional wall motion abnormalities. The left ventricular internal cavity size was normal in size. There is  no left ventricular hypertrophy. Left ventricular diastolic parameters were normal. Right Ventricle: The right ventricular size is normal. No increase in right ventricular wall thickness. Right ventricular systolic function is normal. Tricuspid regurgitation signal is inadequate for assessing PA pressure. Left Atrium: Left atrial size was normal in size. Right Atrium: Right atrial size was normal in size. Pericardium: There is no evidence of pericardial effusion. Mitral Valve: The mitral valve is normal in structure. Normal mobility of the mitral valve leaflets. No evidence of mitral valve regurgitation. No evidence of mitral valve stenosis. MV peak gradient, 2.8 mmHg. The mean mitral valve gradient is 1.0 mmHg. Tricuspid Valve: The tricuspid valve is normal in structure. Tricuspid  valve regurgitation is not demonstrated. No evidence of tricuspid stenosis. Aortic Valve: The aortic valve is normal in structure. Aortic valve regurgitation is not visualized. No aortic stenosis is present. Aortic valve mean gradient measures 2.0 mmHg. Aortic valve peak gradient measures 4.3 mmHg. Aortic valve area, by VTI measures 2.48 cm. Pulmonic Valve: The pulmonic valve was normal in structure. Pulmonic valve regurgitation is not visualized. No evidence of pulmonic stenosis. Aorta: The aortic root is normal in size and structure. Venous: The inferior vena cava is normal in size with greater than 50% respiratory variability, suggesting right atrial pressure of 3 mmHg. IAS/Shunts: No atrial level shunt detected by color flow Doppler.  LEFT VENTRICLE PLAX 2D LVIDd:         4.27 cm  Diastology LVIDs:         2.50 cm  LV e' lateral:   17.00 cm/s LV PW:         0.73 cm  LV E/e' lateral: 3.2 LV IVS:        0.45 cm  LV e' medial:    10.90 cm/s LVOT diam:     1.90 cm  LV E/e' medial:  5.0 LV SV:         44 LV SV Index:   35 LVOT Area:     2.84 cm  RIGHT VENTRICLE RV Basal diam:  2.05 cm LEFT ATRIUM             Index       RIGHT ATRIUM          Index LA diam:        2.50 cm 1.98 cm/m  RA Area:     8.63 cm LA Vol (A2C):   19.3 ml 15.26 ml/m RA Volume:   16.10 ml 12.73 ml/m LA Vol (A4C):   18.2 ml 14.39 ml/m LA Biplane Vol: 19.9 ml 15.73 ml/m  AORTIC VALVE                   PULMONIC VALVE AV Area (Vmax):    2.38 cm    PV Vmax:       0.88 m/s AV Area (Vmean):   2.49 cm    PV Vmean:      60.700 cm/s AV Area (VTI):     2.48 cm    PV VTI:        0.164 m AV Vmax:           104.00 cm/s PV Peak grad:  3.1 mmHg AV Vmean:          72.300 cm/s PV Mean grad:  2.0 mmHg AV VTI:            0.178 m AV Peak Grad:      4.3 mmHg AV Mean Grad:      2.0 mmHg LVOT Vmax:         87.20 cm/s LVOT Vmean:        63.400 cm/s LVOT VTI:          0.156 m LVOT/AV VTI ratio: 0.88  AORTA Ao Root diam: 2.70 cm MITRAL VALVE MV Area (PHT): 4.21  cm    SHUNTS MV Peak grad:  2.8 mmHg    Systemic VTI:  0.16 m MV Mean grad:  1.0 mmHg    Systemic Diam: 1.90 cm MV Vmax:       0.83 m/s MV Vmean:      53.2 cm/s MV Decel Time: 180 msec MV E velocity: 54.30 cm/s MV A velocity: 57.70 cm/s MV E/A ratio:  0.94 Lorine Bears MD Electronically signed by Lorine Bears MD Signature Date/Time: 05/09/2019/1:04:25 PM    Final     Assessment/Plan: 3 Days Post-Op   Principal Problem:   Abscess-multpile sites Active Problems:   History of drug use   Anxiety   Sepsis (HCC)   Opiate abuse, continuous (HCC)   Methamphetamine abuse (HCC)  Patient is stable from an orthopedic standpoint.  I spoke with Dr. Allena Katz via epic haiku text regarding the patient.  She is okay for discharge from an orthopedic standpoint and may follow-up in our office in 7 to 10 days if she has persistent swelling or erythema.  Continue antibiotic therapy as recommended by infectious disease.    Juanell Fairly , MD 05/10/2019, 6:23 PM

## 2019-05-10 NOTE — TOC Progression Note (Addendum)
Transition of Care [facility]) - Progression Note    Patient Details  Name: Jill Shepherd MRN: [id number] Date of Birth: October 01, 1995  Transition of Care [facility]) CM/SW Contact  Woodmere Cellar, RN Phone Number: 05/10/2019, 2:39 PM  Clinical Narrative:     Received call from [facility] with DSS stating she was in hospital to see patient regarding CPS referral. TOC RN CM escorted DSS to patient bedside. RN at bedside with patient.   TOC RN CM attempted to pick up medication at medication management however medication was not completed. Will try again later today.   Staff RN updated TOC RN CM that patient was crying after visit and stating she had no ride home. TOC RN CM offered taxi voucher if needed at discharge. Nurse spoke with patients stepmother who advised that she was not able to assist with patients care as she does not currently have a home. TOC RN CM made referral to [facility] for follow up care.         Expected Discharge Plan and Services           Expected Discharge Date: 05/10/19                                     Social Determinants of Health (SDOH) Interventions    Readmission Risk Interventions No flowsheet data found.

## 2019-05-10 NOTE — OR Nursing (Signed)
Patient woke up from bad dream.  Very anxious and scarred.  Medicated with Vistaril.  While talking with patient and holding her hand for comfort, patient admitted she took Suboxone around 2000.

## 2019-05-10 NOTE — Discharge Instructions (Signed)
Cellulitis, Adult  Cellulitis is a skin infection. The infected area is often warm, red, swollen, and sore. It occurs most often in the arms and lower legs. It is very important to get treated for this condition. What are the causes? This condition is caused by bacteria. The bacteria enter through a break in the skin, such as a cut, burn, insect bite, open sore, or crack. What increases the risk? This condition is more likely to occur in people who:  Have a weak body defense system (immune system).  Have open cuts, burns, bites, or scrapes on the skin.  Are older than 23 years of age.  Have a blood sugar problem (diabetes).  Have a long-lasting (chronic) liver disease (cirrhosis) or kidney disease.  Are very overweight (obese).  Have a skin problem, such as: ? Itchy rash (eczema). ? Slow movement of blood in the veins (venous stasis). ? Fluid buildup below the skin (edema).  Have been treated with high-energy rays (radiation).  Use IV drugs. What are the signs or symptoms? Symptoms of this condition include:  Skin that is: ? Red. ? Streaking. ? Spotting. ? Swollen. ? Sore or painful when you touch it. ? Warm.  A fever.  Chills.  Blisters. How is this diagnosed? This condition is diagnosed based on:  Medical history.  Physical exam.  Blood tests.  Imaging tests. How is this treated? Treatment for this condition may include:  Medicines to treat infections or allergies.  Home care, such as: ? Rest. ? Placing cold or warm cloths (compresses) on the skin.  Hospital care, if the condition is very bad. Follow these instructions at home: Medicines  Take over-the-counter and prescription medicines only as told by your doctor.  If you were prescribed an antibiotic medicine, take it as told by your doctor. Do not stop taking it even if you start to feel better. General instructions   Drink enough fluid to keep your pee (urine) pale yellow.  Do not  touch or rub the infected area.  Raise (elevate) the infected area above the level of your heart while you are sitting or lying down.  Place cold or warm cloths on the area as told by your doctor.  Keep all follow-up visits as told by your doctor. This is important. Contact a doctor if:  You have a fever.  You do not start to get better after 1-2 days of treatment.  Your bone or joint under the infected area starts to hurt after the skin has healed.  Your infection comes back. This can happen in the same area or another area.  You have a swollen bump in the area.  You have new symptoms.  You feel ill and have muscle aches and pains. Get help right away if:  Your symptoms get worse.  You feel very sleepy.  You throw up (vomit) or have watery poop (diarrhea) for a long time.  You see red streaks coming from the area.  Your red area gets larger.  Your red area turns dark in color. These symptoms may represent a serious problem that is an emergency. Do not wait to see if the symptoms will go away. Get medical help right away. Call your local emergency services (911 in the [place] Do not drive yourself to the hospital. Summary  Cellulitis is a skin infection. The area is often warm, red, swollen, and sore.  This condition is treated with medicines, rest, and cold and warm cloths.  Take all medicines  only as told by your doctor.  Tell your doctor if symptoms do not start to get better after 1-2 days of treatment. This information is not intended to replace advice given to you by your health care provider. Make sure you discuss any questions you have with your health care provider. Document Revised: 06/04/2017 Document Reviewed: 06/04/2017 Elsevier Patient Education  2020 [employer]   Fingertip Infection There are two main types of fingertip infections:  Long-term (chronic) or acute paronychia. This is an infection that happens around your nail. This type of infection  can start in one nail or occur gradually over time and affect more than one nail. The fingernails that are infected may become thick and deformed. This condition can also happen suddenly (be acute).  Felon. This is a bacterial infection in the tip of your finger (pad). A felon infection can cause a painful collection of pus (an abscess) to form inside your fingertip. If the infection is not treated, the infection can spread as deep as the tendon or bone. What are the causes? Paronychia infection can be caused by:  Bacteria.  Funguses.  A mix of both bacteria and funguses. A felon infection is usually caused by the bacteria that are normally found on your skin. An infection can develop if the bacteria spread through your skin to the pad of tissue inside your fingertip. What increases the risk? You are more likely to develop a fingertip infection if:  You have diabetes.  You have a weak body's defense system (immune system).  You work with your hands.  Your hands are exposed to moisture, chemicals, or irritants for long periods of time.  You have poor circulation.  You bite, chew, or pick your fingernails. What are the signs or symptoms? Symptoms of paronychia infection may affect one or more fingernails and may include:  Pain, swelling, and redness around the nail.  Pus-filled pockets at the base or side of the fingernail (cuticle).  Thick fingernails that separate from the nail bed.  Pus that drains from the nail bed. Symptoms of a felon usually affect just one fingertip pad and include:  Severe, throbbing pain.  Redness.  Swelling.  Warmth.  Tenderness when the affected fingertip is touched. How is this diagnosed? This condition is diagnosed based on:  Your medical history.  A physical exam.  Testing. If there is pus draining from the infection, it may be swabbed and sent to the lab for a culture.  An X-ray. This may be done to see if the infection has spread  to the bone. How is this treated? Treatment for a fingertip infection may include:  Warm water or salt-water soaks several times per day.  Antibiotic medicine. This may be an ointment or pills.  Steroid ointment.  Antifungal pills.  Drainage of pus pockets. This is done by making an incision to open the fingertip to drain pus.  Wearing gloves to protect your nails. Follow these instructions at home: Medicines  Take or apply over-the-counter and prescription medicines only as told by your health care provider.  If you were prescribed an antibiotic medicine, take or apply it as told by your health care provider. Do not stop using the antibiotic even if you start to feel better. Wound care  Follow instructions from your health care provider about how to take care of your wound. Make sure you: ? Wash your hands with soap and water before and after you change your bandage (dressing). If soap and water  are not available, use hand sanitizer. ? Change your dressing as told by your health care provider. ? Leave stitches (sutures), skin glue, or adhesive strips in place. These skin closures may need to stay in place for 2 weeks or longer. If adhesive strip edges start to loosen and curl up, you may trim the loose edges. Do not remove adhesive strips completely unless your health care provider tells you to do that.  Clean the infected area each day with warm water or salt water, or as told by your health care provider. ? Gently wash the infected area with mild soap and water. ? Rinse the infected area with water to remove all soap. ? Pat the infected area dry with a clean towel. Do not rub it. ? To make a salt-water mixture, completely dissolve -1 tsp (3-6 g) of salt in 1 cup (237 mL) of warm water.  Check the infected area every day for more signs of infection. Watch for: ? More redness, swelling, or pain. ? More fluid or blood. ? Warmth. ? A bad smell. Bathing  Keep the dressing dry  until your health care provider says it can be removed.  Ask your health care provider if you may take baths, swim, shower, or use a hot tub. To help prevent spread of the infection, you may only be allowed to take sponge baths. This is rare.  Do not let your bandage get wet. Cover it with a watertight covering when you take a bath or shower. General instructions  Follow instructions from your health care provider about: ? How to take care of the infection. ? When and how you should change your bandage (dressing). ? When you should remove your dressing.  Raise (elevate) the infected area above the level of your heart while you are sitting or lying down or as told by your health care provider. This will help reduce inflammation.  Do not scratch or pick at the infected area.  Wear gloves as told by your health care provider.  Keep all follow-up visits as told by your health care provider. This is important. How is this prevented?  Wear gloves when you work with your hands.  Wash your hands often with antibacterial soap.  Avoid letting your hands stay wet or irritated for long periods of time.  Do not bite your fingernails.  Do not suck on your fingers.  Do not pull on your cuticles.  Use clean scissors or nail clippers to trim your nails. Do not cut your fingernails very short. Contact a health care provider if:  Your pain medicine is not helping.  You have more redness, swelling, or pain at your fingertip.  You continue to have fluid, blood, or pus coming from your fingertip.  Your infection area feels warm to the touch.  You continue to notice a bad smell coming from your fingertip or your dressing. Get help right away if:  The area of redness is spreading, or you notice a red streak going away from your fingertip.  You have a fever. Summary  Paronychia is an infection that happens around your nail. Paronychia infection can be caused by bacteria, funguses, or a mix  of both.  A felon infection is usually caused by the bacteria that are normally found on your skin. An infection can develop if the bacteria spread through your skin to the pad of tissue inside your fingertip.  Follow instructions from your health care provider about how to take care of the  infection.  Take or apply over-the-counter and prescription medicines only as told by your health care provider.  Contact a health care provider if you have more drainage, redness, swelling, or pain at your fingertip. This information is not intended to replace advice given to you by your health care provider. Make sure you discuss any questions you have with your health care provider. Document Revised: 10/13/2017 Document Reviewed: 10/13/2017 Elsevier Patient Education  [employer]. Abstain from using any form of street drugs

## 2019-05-10 NOTE — OR Nursing (Signed)
Checked on patient, asleep.

## 2019-05-10 NOTE — TOC Progression Note (Signed)
Transition of Care [facility]) - Progression Note    Patient Details  Name: Jill Shepherd MRN: [id number] Date of Birth: 22-Jan-1996  Transition of Care [facility]) CM/SW Contact  Thornton Cellar, RN Phone Number: 05/10/2019, 10:02 AM  Clinical Narrative:    Spoke with Dr. Allena Katz who is preparing for discharge and concerned regarding patients ability to afford Linezolid. TOC RN CM outreached to [employer] @ medication mgmt and confirmed med mgmt has 30 tablets available. TOC RN CM confirmed that patient would only need 20 tablets for prescription. Will work with MD and medication management to obtain medication.         Expected Discharge Plan and Services                                                 Social Determinants of Health (SDOH) Interventions    Readmission Risk Interventions No flowsheet data found.

## 2019-05-10 NOTE — TOC Transition Note (Signed)
Transition of Care [facility]) - CM/SW Discharge Note   Patient Details  Name: Jill Shepherd MRN: [id number] Date of Birth: 07-17-1995  Transition [facility] Care [facility]) CM/SW Contact:  [place] Cellar, RN Phone Number: 05/10/2019, 4:41 PM   Clinical Narrative:    Delivered medications to patient bedside. Discussed medication management and referral to [facility]. Patient states her sister is coming to pick her up for discharge at 6pm. RN notified and no other needs or concerned. TOC signing off.          Patient Goals and CMS Choice        Discharge Placement                       Discharge Plan and Services                                     Social Determinants of Health (SDOH) Interventions     Readmission Risk Interventions No flowsheet data found.

## 2019-05-11 ENCOUNTER — Telehealth: Payer: Self-pay | Admitting: General Practice

## 2019-05-11 LAB — CULTURE, BLOOD (ROUTINE X 2)
Culture: NO GROWTH
Culture: NO GROWTH
Special Requests: ADEQUATE
Special Requests: ADEQUATE

## 2019-05-11 NOTE — Telephone Encounter (Signed)
Called pt to go over [facility] eligibility. I was able to leave a vm

## 2019-05-12 ENCOUNTER — Emergency Department
Admission: EM | Admit: 2019-05-12 | Discharge: 2019-05-13 | Disposition: A | Discharge status: Other Acute Inpatient Hospital | Payer: Self-pay | Attending: Student | Admitting: Student

## 2019-05-12 ENCOUNTER — Other Ambulatory Visit: Payer: Self-pay

## 2019-05-12 DIAGNOSIS — O09299 Supervision of pregnancy with other poor reproductive or obstetric history, unspecified trimester: Secondary | ICD-10-CM

## 2019-05-12 DIAGNOSIS — Z8759 Personal history of other complications of pregnancy, childbirth and the puerperium: Secondary | ICD-10-CM

## 2019-05-12 DIAGNOSIS — Z20822 Contact with and (suspected) exposure to covid-19: Secondary | ICD-10-CM | POA: Insufficient documentation

## 2019-05-12 DIAGNOSIS — Z3490 Encounter for supervision of normal pregnancy, unspecified, unspecified trimester: Secondary | ICD-10-CM

## 2019-05-12 DIAGNOSIS — Z87898 Personal history of other specified conditions: Secondary | ICD-10-CM

## 2019-05-12 DIAGNOSIS — F1520 Other stimulant dependence, uncomplicated: Secondary | ICD-10-CM | POA: Insufficient documentation

## 2019-05-12 DIAGNOSIS — F1510 Other stimulant abuse, uncomplicated: Secondary | ICD-10-CM | POA: Diagnosis present

## 2019-05-12 DIAGNOSIS — A419 Sepsis, unspecified organism: Secondary | ICD-10-CM | POA: Diagnosis present

## 2019-05-12 DIAGNOSIS — Z8751 Personal history of pre-term labor: Secondary | ICD-10-CM

## 2019-05-12 DIAGNOSIS — F1120 Opioid dependence, uncomplicated: Secondary | ICD-10-CM

## 2019-05-12 DIAGNOSIS — F32A Depression, unspecified: Secondary | ICD-10-CM

## 2019-05-12 DIAGNOSIS — F329 Major depressive disorder, single episode, unspecified: Secondary | ICD-10-CM | POA: Insufficient documentation

## 2019-05-12 DIAGNOSIS — F419 Anxiety disorder, unspecified: Secondary | ICD-10-CM | POA: Diagnosis present

## 2019-05-12 DIAGNOSIS — R636 Underweight: Secondary | ICD-10-CM | POA: Diagnosis present

## 2019-05-12 DIAGNOSIS — F1991 Other psychoactive substance use, unspecified, in remission: Secondary | ICD-10-CM

## 2019-05-12 LAB — AEROBIC/ANAEROBIC CULTURE W GRAM STAIN (SURGICAL/DEEP WOUND): Gram Stain: NONE SEEN

## 2019-05-12 LAB — CBC
HCT: 38.9 % (ref 36.0–46.0)
Hemoglobin: 12.8 g/dL (ref 12.0–15.0)
MCH: 29.5 pg (ref 26.0–34.0)
MCHC: 32.9 g/dL (ref 30.0–36.0)
MCV: 89.6 fL (ref 80.0–100.0)
Platelets: 314 10*3/uL (ref 150–400)
RBC: 4.34 MIL/uL (ref 3.87–5.11)
RDW: 14.2 % (ref 11.5–15.5)
WBC: 8 10*3/uL (ref 4.0–10.5)
nRBC: 0 % (ref 0.0–0.2)

## 2019-05-12 LAB — COMPREHENSIVE METABOLIC PANEL
ALT: 63 U/L — ABNORMAL HIGH (ref 0–44)
AST: 55 U/L — ABNORMAL HIGH (ref 15–41)
Albumin: 4.4 g/dL (ref 3.5–5.0)
Alkaline Phosphatase: 81 U/L (ref 38–126)
Anion gap: 10 (ref 5–15)
BUN: 13 mg/dL (ref 6–20)
CO2: 29 mmol/L (ref 22–32)
Calcium: 10 mg/dL (ref 8.9–10.3)
Chloride: 99 mmol/L (ref 98–111)
Creatinine, Ser: 0.7 mg/dL (ref 0.44–1.00)
GFR calc Af Amer: >60 mL/min (ref >60)
GFR calc non Af Amer: >60 mL/min (ref >60)
Glucose, Bld: 120 mg/dL — ABNORMAL HIGH (ref 70–99)
Potassium: 4.7 mmol/L (ref 3.5–5.1)
Sodium: 138 mmol/L (ref 135–145)
Total Bilirubin: 0.6 mg/dL (ref 0.3–1.2)
Total Protein: 7.8 g/dL (ref 6.5–8.1)

## 2019-05-12 LAB — URINE DRUG SCREEN, QUALITATIVE (ARMC ONLY)
Amphetamines, Ur Screen: NOT DETECTED
Barbiturates, Ur Screen: NOT DETECTED
Benzodiazepine, Ur Scrn: NOT DETECTED
Cannabinoid 50 Ng, Ur ~~LOC~~: NOT DETECTED
Cocaine Metabolite,Ur ~~LOC~~: NOT DETECTED
MDMA (Ecstasy)Ur Screen: NOT DETECTED
Methadone Scn, Ur: NOT DETECTED
Opiate, Ur Screen: NOT DETECTED
Phencyclidine (PCP) Ur S: NOT DETECTED
Tricyclic, Ur Screen: NOT DETECTED

## 2019-05-12 LAB — POCT PREGNANCY, URINE: Preg Test, Ur: NEGATIVE

## 2019-05-12 LAB — SALICYLATE LEVEL: Salicylate Lvl: <7 mg/dL — ABNORMAL LOW (ref 7.0–30.0)

## 2019-05-12 LAB — ETHANOL: Alcohol, Ethyl (B): <10 mg/dL (ref <10)

## 2019-05-12 LAB — RESPIRATORY PANEL BY RT PCR (FLU A&B, COVID)
Influenza A by PCR: NEGATIVE
Influenza B by PCR: NEGATIVE
SARS Coronavirus 2 by RT PCR: NEGATIVE

## 2019-05-12 LAB — ACETAMINOPHEN LEVEL: Acetaminophen (Tylenol), Serum: <10 ug/mL — ABNORMAL LOW (ref 10–30)

## 2019-05-12 MED ORDER — HYDROXYZINE HCL 25 MG PO TABS
25.0000 mg | ORAL_TABLET | Freq: Three times a day (TID) | ORAL | Status: DC | PRN
  Administered 2019-05-13: 25 mg via ORAL
  Filled 2019-05-12: qty 1

## 2019-05-12 MED ORDER — HYDROXYZINE HCL 25 MG PO TABS
50.0000 mg | ORAL_TABLET | Freq: Once | ORAL | Status: AC
  Administered 2019-05-12: 22:00:00 50 mg via ORAL
  Filled 2019-05-12: qty 2

## 2019-05-12 MED ORDER — SULFAMETHOXAZOLE-TRIMETHOPRIM 800-160 MG PO TABS
1.0000 | ORAL_TABLET | Freq: Two times a day (BID) | ORAL | Status: DC
  Administered 2019-05-12 – 2019-05-13 (×2): 1 via ORAL
  Filled 2019-05-12 (×3): qty 1

## 2019-05-12 NOTE — ED Notes (Signed)
BEHAVIORAL HEALTH ROUNDING Patient sleeping: YES Patient alert and oriented: SLEEPING Behavior appropriate: SLEEPING Nutrition and fluids offered: SLEEPING Toileting and hygiene offered: SLEEPING Sitter present: NO Law enforcement present: YES 

## 2019-05-12 NOTE — ED Provider Notes (Addendum)
[facility] Emergency Department Provider Note   ____________________________________________   I have reviewed the triage vital signs and the nursing notes.   HISTORY  Chief Complaint Suicidal   History limited by: Not cooperative   HPI Jill Shepherd is a 24 y.o. female who presents to the emergency department today under IVC from [facility] because of concern for thoughts of self harm and drug use. The patient herself is not forthcoming with information and does not answer all questions. She states that she is simply tired and that she wants to sleep. Denies thoughts of self harm to myself. Denies any medical complaints.    Records reviewed. Per medical record review patient has a history of polysubstance abuse, anxiety.  Past Medical History:  Diagnosis Date  . Drug abuse (HCC)   . Headache   . SAB (spontaneous abortion) 01/19/2014   stillborn at 20.4 wks    Patient Active Problem List   Diagnosis Date Noted  . Opiate abuse, continuous (HCC) 05/07/2019  . 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 Sepsis (HCC) 05/06/2019  . Abscess-multpile sites 05/06/2019  . Anxiety 08/04/2017  . Postpartum depression 08/04/2017  . Labor and delivery indication for care or intervention 07/17/2017  . SVD (spontaneous vaginal delivery) 07/17/2017  . Pregnancy 07/07/2017  . History of preterm delivery 11/28/2016  . Pregnancy complicated by subutex maintenance, antepartum (HCC) 11/28/2016  . History of preterm premature rupture of membranes (PPROM) 09/25/2015  . History of pregnancy loss in prior pregnancy, currently pregnant 05/14/2015  . History of drug use 05/14/2015  . Underweight 05/14/2015    Past Surgical History:  Procedure Laterality Date  . INCISION AND DRAINAGE ABSCESS Left 05/07/2019   Procedure: INCISION AND DRAINAGE ABSCESS;  Surgeon: Leafy Ro, MD;  Location: [facility] ORS;  Service: General;  Laterality: Left;    Prior to Admission medications    Medication Sig Start Date End Date Taking? Authorizing Provider  hydrOXYzine (ATARAX/VISTARIL) 25 MG tablet Take 1 tablet (25 mg total) by mouth 3 (three) times daily as needed for anxiety. 05/10/19   Enedina Finner, MD  ibuprofen (ADVIL) 400 MG tablet Take 1 tablet (400 mg total) by mouth every 8 (eight) hours as needed for moderate pain. 05/10/19   Enedina Finner, MD  sulfamethoxazole-trimethoprim (BACTRIM DS) 800-160 MG tablet Take 1 tablet by mouth every 12 (twelve) hours for 10 days. 05/10/19 05/20/19  Enedina Finner, MD    Allergies Patient has no known allergies.  Family History  Problem Relation Age of Onset  . Hypertension Father   . Hypertension Paternal Grandfather     Social History Social History   Tobacco Use  . Smoking status: Never Smoker  . Smokeless tobacco: Never Used  Substance Use Topics  . Alcohol use: No    Alcohol/week: 0.0 standard drinks  . Drug use: Yes    Types: Other-see comments, Amphetamines, Fentanyl, Heroin    Comment: subutex tx    Review of Systems Constitutional: No fever/chills Eyes: No visual changes. ENT: No sore throat. Cardiovascular: Denies chest pain. Respiratory: Denies shortness of breath. Gastrointestinal: No abdominal pain.  No nausea, no vomiting.  No diarrhea.   Genitourinary: Negative for dysuria. Musculoskeletal: Negative for back pain. Skin: Negative for rash. Neurological: Negative for headaches, focal weakness or numbness.  ____________________________________________   PHYSICAL EXAM:  VITAL SIGNS: ED Triage Vitals [05/12/19 1611]  Enc Vitals Group     BP 121/64     Pulse Rate 87     Resp 16  Temp 98 F (36.7 C)     Temp Source Oral     SpO2 100 %     Weight 80 lb (36.3 kg)     Height 5' (1.524 m)     Head Circumference      Peak Flow      Pain Score 0   Constitutional: Alert and oriented.  Eyes: Conjunctivae are normal.  ENT      Head: Normocephalic and atraumatic.      Nose: No congestion/rhinnorhea.       Mouth/Throat: Mucous membranes are moist.      Neck: No stridor. Cardiovascular: Normal rate, regular rhythm.  No murmurs, rubs, or gallops. Respiratory: Normal respiratory effort without tachypnea nor retractions. Breath sounds are clear and equal bilaterally. No wheezes/rales/rhonchi. Gastrointestinal: Soft  Genitourinary: Deferred Musculoskeletal: Normal range of motion in all extremities. No lower extremity edema. Neurologic:  Normal speech and language. No gross focal neurologic deficits are appreciated.  Skin:  Skin is warm, dry and intact. No rash noted. Psychiatric: Withdrawn. Not cooperative with exam.   ____________________________________________    LABS (pertinent positives/negatives)  Upreg negative UDS none detected Acetaminophen, salicylate, ethanol below threshold CBC wbc 8.0, hgb 12.8, plt 314 CMP wnl except glu 120, ast 55, alt 63  ____________________________________________   EKG  None  ____________________________________________    RADIOLOGY  None  ____________________________________________   PROCEDURES  Procedures  ____________________________________________   INITIAL IMPRESSION / ASSESSMENT AND PLAN / ED COURSE  Pertinent labs & imaging results that were available during my care of the patient were reviewed by me and considered in my medical decision making (see chart for details).   Patient presents to the emergency department today under IVC from [place] for concern for suicidal ideation.  On exam patient is not very forthcoming with information.  She did deny any suicidal ideation to myself however given the patient's hesitancy to answer questions I do have concerns.  Will continue IVC.  The patient has been placed in psychiatric observation due to the need to provide a safe environment for the patient while obtaining psychiatric consultation and evaluation, as well as ongoing medical and medication management to treat the patient's  condition.  The patient has been placed under full IVC at this time.  ____________________________________________   FINAL CLINICAL IMPRESSION(S) / ED DIAGNOSES  Final diagnoses:  Depression, unspecified depression type     Note: This dictation was prepared with Dragon dictation. Any transcriptional errors that result from this process are unintentional     Nance Pear, MD 05/12/19 2051    Nance Pear, MD 05/12/19 2052

## 2019-05-12 NOTE — ED Notes (Signed)
Dinner tray ordered. Patient awaitin transfer in am. covid swab obtained and sent to lab.

## 2019-05-12 NOTE — ED Notes (Signed)
Patient observed with no unusual behavior or acute distress. Patient with no verbalized needs or c/o at this time.... will continue to monitor and follow up as needed. Security staff monitoring patient on [employer] system.  

## 2019-05-12 NOTE — ED Notes (Signed)
Pt transferred into ED BHU room 5  Patient assigned to appropriate care area. Patient oriented to unit/care area: Informed that, for their safety, care areas are designed for safety and monitored by security cameras at all times; Visiting hours and phone times explained to patient. Patient verbalizes understanding, and verbal contract for safety obtained.   Assessment completed  She denies pain

## 2019-05-12 NOTE — ED Notes (Signed)
Report to receiving nurse, patient moved to [facility]

## 2019-05-12 NOTE — ED Notes (Signed)
asssumed care of patient, patient coming from [facility] with c/o of SI, as per triage nurse. Patient excepted to [facility] but unable to go until morning and based on neg covid test. Patient roomed in ed. Aware of plan of care. Safety maintained. Will monitor.

## 2019-05-12 NOTE — ED Notes (Signed)
Pt expresses concerns over getting her rx meds; st she is taking something for anxiety, antibiotic, and 4mg  suboxone every day; reviewed pt's chart and prescriptions; located rx for vistaril & bactrim; no record of suboxone; message sent to Dr for orders

## 2019-05-12 NOTE — ED Notes (Signed)
Pt has been accepted to [facility] pending negative COVID test [place] facility rep [phone]  call report number [phone]  pt can arrive on 4.16.2021 after 8am Accepting [facility]

## 2019-05-12 NOTE — ED Notes (Addendum)
Pt in room watching TV, appears calmer; New bandaids applied to thumb tips bilat and rt index finger tip; gauze/tegraderm dressing changed to rt buttock and left axillae; areas were I&D on 4/9; all appears to be healing well with no drainage noted; skin slightly reddened around edges of old dressings

## 2019-05-12 NOTE — ED Notes (Addendum)
NP in to dayroom speak with pt regarding plan of care, pt is ambulatory without difficulty; pt is visibly upset regarding update, pacing and talking loudly

## 2019-05-12 NOTE — ED Notes (Addendum)
BEHAVIORAL HEALTH ROUNDING Patient sleeping: NO Patient alert and oriented: YES Behavior appropriate: YES Nutrition and fluids offered: YES, snacks & ginger ale given Toileting and hygiene offered: YES Sitter present: NO Patent examiner present: YES

## 2019-05-12 NOTE — ED Triage Notes (Signed)
Pt to ED via [facility] under IVC from [employer]. Pt states that her stepmother misunderstood her when she said "that if I don't stop going through what I'm going through then I will just kill myself". Pt states that she "is going through a lot of shit right now and that if people would listen to me I would be ok". Denies HI.

## 2019-05-13 ENCOUNTER — Inpatient Hospital Stay
Admission: AD | Admit: 2019-05-13 | Discharge: 2019-05-19 | DRG: 885 | Disposition: A | Discharge status: Home / Self Care | Payer: No Typology Code available for payment source | Source: Intra-hospital | Attending: Psychiatry | Admitting: Psychiatry

## 2019-05-13 ENCOUNTER — Other Ambulatory Visit: Payer: Self-pay

## 2019-05-13 ENCOUNTER — Encounter: Payer: Self-pay | Admitting: Behavioral Health

## 2019-05-13 DIAGNOSIS — Z8249 Family history of ischemic heart disease and other diseases of the circulatory system: Secondary | ICD-10-CM | POA: Diagnosis not present

## 2019-05-13 DIAGNOSIS — F1510 Other stimulant abuse, uncomplicated: Secondary | ICD-10-CM | POA: Diagnosis present

## 2019-05-13 DIAGNOSIS — Z813 Family history of other psychoactive substance abuse and dependence: Secondary | ICD-10-CM | POA: Diagnosis not present

## 2019-05-13 DIAGNOSIS — Z22322 Carrier or suspected carrier of Methicillin resistant Staphylococcus aureus: Secondary | ICD-10-CM | POA: Diagnosis not present

## 2019-05-13 DIAGNOSIS — Z653 Problems related to other legal circumstances: Secondary | ICD-10-CM

## 2019-05-13 DIAGNOSIS — N390 Urinary tract infection, site not specified: Secondary | ICD-10-CM | POA: Diagnosis present

## 2019-05-13 DIAGNOSIS — B9562 Methicillin resistant Staphylococcus aureus infection as the cause of diseases classified elsewhere: Secondary | ICD-10-CM | POA: Diagnosis present

## 2019-05-13 DIAGNOSIS — F319 Bipolar disorder, unspecified: Principal | ICD-10-CM | POA: Diagnosis present

## 2019-05-13 DIAGNOSIS — F1920 Other psychoactive substance dependence, uncomplicated: Secondary | ICD-10-CM | POA: Diagnosis not present

## 2019-05-13 DIAGNOSIS — Z818 Family history of other mental and behavioral disorders: Secondary | ICD-10-CM | POA: Diagnosis not present

## 2019-05-13 DIAGNOSIS — F1190 Opioid use, unspecified, uncomplicated: Secondary | ICD-10-CM | POA: Diagnosis present

## 2019-05-13 DIAGNOSIS — R45851 Suicidal ideations: Secondary | ICD-10-CM | POA: Diagnosis present

## 2019-05-13 DIAGNOSIS — F1123 Opioid dependence with withdrawal: Secondary | ICD-10-CM | POA: Diagnosis not present

## 2019-05-13 DIAGNOSIS — L089 Local infection of the skin and subcutaneous tissue, unspecified: Secondary | ICD-10-CM | POA: Diagnosis present

## 2019-05-13 DIAGNOSIS — F1994 Other psychoactive substance use, unspecified with psychoactive substance-induced mood disorder: Secondary | ICD-10-CM | POA: Diagnosis not present

## 2019-05-13 DIAGNOSIS — R636 Underweight: Secondary | ICD-10-CM | POA: Diagnosis present

## 2019-05-13 DIAGNOSIS — Z681 Body mass index (BMI) 19 or less, adult: Secondary | ICD-10-CM | POA: Diagnosis not present

## 2019-05-13 MED ORDER — SULFAMETHOXAZOLE-TRIMETHOPRIM 800-160 MG PO TABS
1.0000 | ORAL_TABLET | Freq: Two times a day (BID) | ORAL | Status: DC
  Administered 2019-05-13 – 2019-05-19 (×12): 1 via ORAL
  Filled 2019-05-13 (×12): qty 1

## 2019-05-13 MED ORDER — MAGNESIUM HYDROXIDE 400 MG/5ML PO SUSP: 30.0000 mL | Freq: Every day | ORAL | Status: DC | PRN

## 2019-05-13 MED ORDER — ALUM & MAG HYDROXIDE-SIMETH 200-200-20 MG/5ML PO SUSP: 30.0000 mL | ORAL | Status: DC | PRN

## 2019-05-13 MED ORDER — HYDROXYZINE HCL 50 MG PO TABS
50.0000 mg | ORAL_TABLET | Freq: Four times a day (QID) | ORAL | Status: DC | PRN
  Administered 2019-05-13 – 2019-05-18 (×13): 50 mg via ORAL
  Filled 2019-05-13 (×13): qty 1

## 2019-05-13 MED ORDER — ACETAMINOPHEN 325 MG PO TABS
650.0000 mg | ORAL_TABLET | Freq: Four times a day (QID) | ORAL | Status: DC | PRN
  Administered 2019-05-14 – 2019-05-18 (×2): 650 mg via ORAL
  Filled 2019-05-13 (×2): qty 2

## 2019-05-13 MED ORDER — IBUPROFEN 800 MG PO TABS: 400.0000 mg | ORAL_TABLET | Freq: Four times a day (QID) | ORAL | Status: DC | PRN

## 2019-05-13 MED ORDER — IBUPROFEN 200 MG PO TABS
400.0000 mg | ORAL_TABLET | Freq: Three times a day (TID) | ORAL | Status: DC | PRN
  Administered 2019-05-13: 400 mg via ORAL
  Filled 2019-05-13 (×2): qty 2

## 2019-05-13 NOTE — ED Notes (Signed)
Hourly rounding reveals patient in day room. No complaints, stable, in no acute distress. Q15 minute rounds and monitoring via Security Cameras to continue. 

## 2019-05-13 NOTE — ED Notes (Signed)
BEHAVIORAL HEALTH ROUNDING Patient sleeping: YES Patient alert and oriented: SLEEPING Behavior appropriate: SLEEPING Nutrition and fluids offered: SLEEPING Toileting and hygiene offered: SLEEPING Sitter present: NO Law enforcement present: YES 

## 2019-05-13 NOTE — Plan of Care (Signed)
Patient new to the unit.   Problem: Education: Goal: Knowledge of [facility] Education information/materials will improve Outcome: Not Progressing Goal: Emotional status will improve Outcome: Not Progressing Goal: Mental status will improve Outcome: Not Progressing Goal: Verbalization of understanding the information provided will improve Outcome: Not Progressing   Problem: Safety: Goal: Periods of time without injury will increase Outcome: Not Progressing   Problem: Education: Goal: Ability to make informed decisions regarding treatment will improve Outcome: Not Progressing   Problem: Self-Concept: Goal: Ability to disclose and discuss suicidal ideas will improve Outcome: Not Progressing Goal: Will verbalize positive feelings about self Outcome: Not Progressing

## 2019-05-13 NOTE — BH Assessment (Signed)
Referral information for Psychiatric Hospitalization faxed to;   [facility] [phone]),   . [facility] [phone] -or- [phone]),   . [facility] [phone])  . [facility] [phone])  . [facility] [phone])

## 2019-05-13 NOTE — BH Assessment (Signed)
PATIENT BED AVAILABLE AFTER 9:30AM  Patient is to be admitted to [facility] by Psychiatric Nurse Practitioner Gillermo Murdoch.  Attending Physician will be Dr. Toni Amend.   Patient has been assigned to room 324, by [facility] Charge Nurse [place].    ER staff is aware of the admission:  [facility] ER Secretary    Dr. Colon Branch, ER MD   Misty Stanley Patient's Nurse   [facility] Patient Access.
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
## 2019-05-13 NOTE — BH Assessment (Addendum)
Assessment Note  Jill Shepherd is an 24 y.o. female presenting to [facility] ED under IVC given by [facility]. Per triage note Pt to ED via [facility] under IVC from [place]. Pt states that her stepmother misunderstood her when she said "that if I don't stop going through what I'm going through then I will just kill myself". Pt states that she "is going through a lot of shit right now and that if people would listen to me I would be ok". Denies HI. During assessment patient was pacing through the hallway and had difficulty being still, speech was rapid but was alert, oriented x4 and pleasant. Patient reported "I was on drugs, I was shooting up drugs, I have been losing my mind." Patient reported that she has recently had a death in her family "my daddy died, and I lost a few babies." Patient reported past trauma associated with giving birth "to a still born baby when I was 43, then my daddy died in 06/05/2015." Patient reported "me and my husband lost our son recently too." Patient reported "I deal with a lot of loss and it made me turn to drugs."  Patient reported that she currently uses "Meth and Heroin" but reported "I haven't used Meth in a couple of weeks." Patient UDS is negative for all substances. Patient reported that she currently attends [facility] is prescribed medication for her anxiety "I have really bad anxiety that's why I can't sit still." Patient reported that she currently has not been sleeping. Patient denies current SI/HI/AH/HV and does not appear to be responding to any internal or external stimuli.    Per Psyc NP patient is recommended for Inpatient Hospitalization.   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 Drug abuse ([place])   . Headache   . SAB (spontaneous abortion) 01/19/2014   stillborn at 20.4 wks    Past Surgical History:  Procedure Laterality Date  .  INCISION AND DRAINAGE ABSCESS Left 05/07/2019   Procedure: INCISION AND DRAINAGE ABSCESS;  Surgeon: Jules Husbands, MD;  Location: [facility] ORS;  Service: General;  Laterality: Left;    Family History:  Family History  Problem Relation Age of Onset  . Hypertension Father   . Hypertension Paternal Grandfather     Social History:  reports that she has never smoked. She has never used smokeless tobacco. She reports current drug use. Drugs: Other-see comments, Amphetamines, Fentanyl, and Heroin. She reports that she does not drink alcohol.  Additional Social History:  Alcohol / Drug Use Pain Medications: See MAR Prescriptions: See MAR Over the Counter: See MAR History of alcohol / drug use?: Yes Substance #1 Name of Substance 1: Methamphetamine Substance #2 Name of Substance 2: Heroin  CIWA: CIWA-Ar BP: (!) 113/59 Pulse Rate: 92 COWS:    Allergies: No Known Allergies  Home Medications: (Not in a hospital admission)   OB/GYN Status:  No LMP recorded. (Menstrual status: Irregular Periods).  General Assessment Data Location of Assessment: [facility] ED TTS Assessment: In system Is this a Tele or Face-to-Face Assessment?: Face-to-Face Is this an Initial Assessment or a Re-assessment for this encounter?: Initial Assessment Patient Accompanied by:: N/A Language Other than English: No Living Arrangements: Other (Comment)(Private Residence) What gender do you identify as?: Female Marital status: Single Pregnancy Status: No Living Arrangements: Other relatives Can pt return to current living arrangement?: Yes Admission Status: Involuntary Petitioner: Other([facility]) Is  patient capable of signing voluntary admission?: No Referral Source: Other Insurance type: None  Medical Screening Exam [facility] Walk-in ONLY) Medical Exam completed: Yes  Crisis Care Plan Living Arrangements: Other relatives Legal Guardian: Other:(Self) Name of Psychiatrist: None Name of Therapist: [facility]  Education  Status Is patient currently in school?: No Is the patient employed, unemployed or receiving disability?: Unemployed  Risk to self with the past 6 months Suicidal Ideation: No Has patient been a risk to self within the past 6 months prior to admission? : No Suicidal Intent: No Has patient had any suicidal intent within the past 6 months prior to admission? : No Is patient at risk for suicide?: No Suicidal Plan?: No Has patient had any suicidal plan within the past 6 months prior to admission? : No Access to Means: No What has been your use of drugs/alcohol within the last 12 months?: Methamphetamine, Heroin Previous Attempts/Gestures: Yes How many times?: 1 Triggers for Past Attempts: Unknown Intentional Self Injurious Behavior: None Family Suicide History: Unknown Recent stressful life event(s): Other (Comment)(Family loss, Death of children) Persecutory voices/beliefs?: No Depression: Yes Depression Symptoms: Insomnia, Tearfulness, Loss of interest in usual pleasures Substance abuse history and/or treatment for substance abuse?: Yes Suicide prevention information given to non-admitted patients: Not applicable  Risk to Others within the past 6 months Homicidal Ideation: No Does patient have any lifetime risk of violence toward others beyond the six months prior to admission? : No Thoughts of Harm to Others: No Current Homicidal Intent: No Current Homicidal Plan: No Access to Homicidal Means: No History of harm to others?: No Assessment of Violence: None Noted Does patient have access to weapons?: No Criminal Charges Pending?: No Does patient have a court date: No Is patient on probation?: No  Psychosis Hallucinations: None noted Delusions: None noted  Mental Status Report Appearance/Hygiene: In scrubs Eye Contact: Fair Motor Activity: Hyperactivity, Restlessness, Freedom of movement Speech: Rapid Level of Consciousness: Alert Mood: Anxious Affect: Appropriate to  circumstance Anxiety Level: Moderate Thought Processes: Coherent Judgement: Partial Orientation: Person, Place, Time, Situation, Appropriate for developmental age Obsessive Compulsive Thoughts/Behaviors: None  Cognitive Functioning Concentration: Normal Memory: Remote Intact, Recent Intact Is patient IDD: No Insight: Fair Impulse Control: Fair Appetite: Good Have you had any weight changes? : No Change Sleep: Decreased Total Hours of Sleep: 0 Vegetative Symptoms: None  ADLScreening [facility] Assessment Services) Patient's cognitive ability adequate to safely complete daily activities?: Yes Patient able to express need for assistance with ADLs?: Yes Independently performs ADLs?: Yes (appropriate for developmental age)  Prior Inpatient Therapy Prior Inpatient Therapy: Yes Prior Therapy Dates: 11/2018 Prior Therapy Facilty/Provider(s): [facility] Reason for Treatment: Substance Abuse  Prior Outpatient Therapy Prior Outpatient Therapy: Yes Prior Therapy Dates: Current Prior Therapy Facilty/Provider(s): [facility] Reason for Treatment: Anxiety and Depression Does patient have an ACCT team?: No Does patient have Intensive In-House Services?  : No Does patient have [facility] services? : No Does patient have P4CC services?: No  ADL Screening (condition at time of admission) Patient's cognitive ability adequate to safely complete daily activities?: Yes Is the patient deaf or have difficulty hearing?: No Does the patient have difficulty seeing, even when wearing glasses/contacts?: No Does the patient have difficulty concentrating, remembering, or making decisions?: No Patient able to express need for assistance with ADLs?: Yes Does the patient have difficulty dressing or bathing?: No Independently performs ADLs?: Yes (appropriate for developmental age) Does the patient have difficulty walking or climbing stairs?: No Weakness of Legs: None Weakness of Arms/Hands: None  Home Assistive  Devices/Equipment Home Assistive Devices/Equipment: None  Therapy Consults (therapy consults require a physician order) PT Evaluation Needed: No OT Evalulation Needed: No SLP Evaluation Needed: No Abuse/Neglect Assessment (Assessment to be complete while patient is alone) Physical Abuse: Denies Verbal Abuse: Denies Sexual Abuse: Denies Exploitation of patient/patient's resources: Denies Self-Neglect: Denies Values / Beliefs Cultural Requests During Hospitalization: None Spiritual Requests During Hospitalization: None Consults Spiritual Care Consult Needed: No Transition of Care Team Consult Needed: No Advance Directives (For Healthcare) Does Patient Have a Medical Advance Directive?: No          Disposition: Per Psyc NP patient is recommended for Inpatient Hospitalization.  Disposition Initial Assessment Completed for this Encounter: Yes  On Site Evaluation by:   Reviewed with Physician:    Benay Pike MS LCASA  05/13/2019 12:44 AM

## 2019-05-13 NOTE — Progress Notes (Signed)
Patient was admitted from [facility], report was received from [place], [place]. Patient pleasant upon arrival to the unit and denies SI/HI/AVH and pain. Patient endorses anxiety and depression with this Clinical research associate. Patient stated she was here because of her substance abuse problems and wants to get help. Patient was asking about short term substance abuse facility she could go to when she gets out of here. Patient was given education. Skin check completed with Cleo, RN, pt has red marks on her face, surgical wound in left armpit. MRSA there, but had it removed. Pt still on antibiotics. Pt has bandages on her fingers (Baind-aide). Patient presents with pressured speech. Patient oriented to the unit and to her room. No contraband found on patient or in her belongings. Patient given education. Patient given support and encouragement to be active in her treatment plan. Patient being monitored Q 15 minutes for safety per unit protocol. Patient remains safe on the unit.

## 2019-05-13 NOTE — ED Notes (Signed)
Report to include Situation, Background, Assessment, and Recommendations received from Jadeka RN. Patient alert and oriented, warm and dry, in no acute distress. Patient denies SI, HI, AVH and pain. Patient made aware of Q15 minute rounds and security cameras for their safety. Patient instructed to come to me with needs or concerns. 

## 2019-05-13 NOTE — ED Notes (Signed)
Animator notified

## 2019-05-13 NOTE — ED Notes (Signed)
Patient talking with TTS and psychiatry  

## 2019-05-13 NOTE — ED Provider Notes (Signed)
Emergency Medicine Observation Re-evaluation Note  Jill Shepherd is a 24 y.o. female, seen on rounds today.  Pt initially presented to the ED for complaints of Suicidal Currently, the patient is resting.  Physical Exam  BP (!) 113/59 (BP Location: Right Arm)   Pulse 92   Temp (!) 97.5 F (36.4 C) (Oral)   Resp 20   Ht 5' (1.524 m)   Wt 36.3 kg   SpO2 99%   BMI 15.62 kg/m    ED Course / MDM    I have reviewed the labs performed to date as well as medications administered while in observation.  Recent changes in the last 24 hours include nothing.  Plan   Current plan is for admission to [facility] later today. Patient is under full IVC at this time.   Miguel Aschoff., MD 05/13/19 [phone]

## 2019-05-13 NOTE — Consult Note (Signed)
[facility] Face-to-Face Psychiatry Consult   Reason for Consult: Suicidal Referring Physician: Dr. Derrill Kay Patient Identification: Jill Shepherd MRN:  [id number] Principal Diagnosis: <principal problem not specified> Diagnosis:  Active Problems:   History of pregnancy loss in prior pregnancy, currently pregnant   History of drug use   Underweight   History of preterm premature rupture of membranes (PPROM)   History of preterm delivery   Pregnancy complicated by subutex maintenance, antepartum (HCC)   Pregnancy   Labor and delivery indication for care or intervention   Anxiety   Sepsis (HCC)   Methamphetamine abuse (HCC)   Total Time spent with patient: 45 minutes  Subjective: "I was misunderstood tonight and my stepmother thought I said something about hurting myself." Jill Shepherd is a 24 y.o. female patient presented to [facility] ED via law enforcement through [facility] under involuntary commitment status (IVC).  Per the triage nursing note, The patient stated that her stepmother misunderstood her when she said, "that if I don't stop going through what I'm going through, then I will just kill myself." The patient stated that she "is going through a lot of shit right now and that if people listened to me, I would be ok."   The patient was seen face-to-face by this provider; chart reviewed and consulted with Dr.  Derrill Kay on 05/12/2019 due to the patient's care. It was discussed with both providers that the patient does meet the criteria to be admitted to the psychiatric inpatient unit.  The patient is alert and oriented x 4, extremely anxious, pacing on the unit but she is cooperative and mood-congruent with affect on evaluation.  The patient does not appear to be responding to internal or external stimuli. The patient is presenting with any delusional thinking. The patient denies auditory or visual hallucinations. The patient denies any suicidal, homicidal, or self-harm ideations. The patient is  presenting with some psychotic behaviors. During an encounter with the patient, she answered questions appropriately; she had to be redirected on many occasions in answering the questions that were posted to her.  Plan: The patient is a safety risk to self and does require psychiatric inpatient admission for stabilization and treatment. HPI: Per Dr. Derrill Kay: Jill Shepherd is a 24 y.o. female who presents to the emergency department today under IVC from [facility] because of concern for thoughts of self harm and drug use. The patient herself is not forthcoming with information and does not answer all questions. She states that she is simply tired and that she wants to sleep. Denies thoughts of self harm to myself. Denies any medical complaints.   Past Psychiatric History:  Polysubstance abuse Anxiety  Risk to Self: Suicidal Ideation: No Suicidal Intent: No Is patient at risk for suicide?: No Suicidal Plan?: No Access to Means: No What has been your use of drugs/alcohol within the last 12 months?: Methamphetamine, Heroin How many times?: 1 Triggers for Past Attempts: Unknown Intentional Self Injurious Behavior: None Risk to Others: Homicidal Ideation: No Thoughts of Harm to Others: No Current Homicidal Intent: No Current Homicidal Plan: No Access to Homicidal Means: No History of harm to others?: No Assessment of Violence: None Noted Does patient have access to weapons?: No Criminal Charges Pending?: No Does patient have a court date: No Prior Inpatient Therapy: Prior Inpatient Therapy: Yes Prior Therapy Dates: 11/2018 Prior Therapy Facilty/Provider(s): [facility] Reason for Treatment: Substance Abuse Prior Outpatient Therapy: Prior Outpatient Therapy: Yes Prior Therapy Dates: Current Prior Therapy Facilty/Provider(s): [facility] Reason for Treatment: Anxiety  and Depression Does patient have an ACCT team?: No Does patient have Intensive In-House Services?  : No Does patient have [facility] services?  : No Does patient have P4CC services?: No  Past Medical History:  Past Medical History:  Diagnosis Date  . Drug abuse (HCC)   . Headache   . SAB (spontaneous abortion) 01/19/2014   stillborn at 20.4 wks    Past Surgical History:  Procedure Laterality Date  . INCISION AND DRAINAGE ABSCESS Left 05/07/2019   Procedure: INCISION AND DRAINAGE ABSCESS;  Surgeon: Leafy RoPabon, Diego F, MD;  Location: [facility] ORS;  Service: General;  Laterality: Left;   Family History:  Family History  Problem Relation Age of Onset  . Hypertension Father   . Hypertension Paternal Grandfather    Family Psychiatric  History:  Social History:  Social History   Substance and Sexual Activity  Alcohol Use No  . Alcohol/week: 0.0 standard drinks     Social History   Substance and Sexual Activity  Drug Use Yes  . Types: Other-see comments, Amphetamines, Fentanyl, Heroin   Comment: subutex tx    Social History   Socioeconomic History  . Marital status: Single    Spouse name: Not on file  . Number of children: Not on file  . Years of education: Not on file  . Highest education level: Not on file  Occupational History    Employer: [employer]  Tobacco Use  . Smoking status: Never Smoker  . Smokeless tobacco: Never Used  Substance and Sexual Activity  . Alcohol use: No    Alcohol/week: 0.0 standard drinks  . Drug use: Yes    Types: Other-see comments, Amphetamines, Fentanyl, Heroin    Comment: subutex tx  . Sexual activity: Not Currently    Partners: Male    Birth control/protection: None  Other Topics Concern  . Not on file  Social History Narrative  . Not on file   Social Determinants of Health   Financial Resource Strain:   . Difficulty of Paying Living Expenses:   Food Insecurity:   . Worried About Programme researcher, broadcasting/film/videounning Out of Food in the Last Year:   . Baristaan Out of Food in the Last Year:   Transportation Needs:   . Freight forwarderLack of Transportation (Medical):   Marland Kitchen. Lack of Transportation (Non-Medical):   Physical  Activity:   . Days of Exercise per Week:   . Minutes of Exercise per Session:   Stress:   . Feeling of Stress :   Social Connections:   . Frequency of Communication with Friends and Family:   . Frequency of Social Gatherings with Friends and Family:   . Attends Religious Services:   . Active Member of Clubs or Organizations:   . Attends BankerClub or Organization Meetings:   Marland Kitchen. Marital Status:    Additional Social History:    Allergies:  No Known Allergies  Labs:  Results for orders placed or performed during the hospital encounter of 05/12/19 (from the past 48 hour(s))  Comprehensive metabolic panel     Status: Abnormal   Collection Time: 05/12/19  4:18 PM  Result Value Ref Range   Sodium 138 135 - 145 mmol/L   Potassium 4.7 3.5 - 5.1 mmol/L   Chloride 99 98 - 111 mmol/L   CO2 29 22 - 32 mmol/L   Glucose, Bld 120 (H) 70 - 99 mg/dL    Comment: Glucose reference range applies only to samples taken after fasting for at least 8 hours.   BUN 13  6 - 20 mg/dL   Creatinine, Ser 7.20 0.44 - 1.00 mg/dL   Calcium 94.7 8.9 - 09.6 mg/dL   Total Protein 7.8 6.5 - 8.1 g/dL   Albumin 4.4 3.5 - 5.0 g/dL   AST 55 (H) 15 - 41 U/L   ALT 63 (H) 0 - 44 U/L   Alkaline Phosphatase 81 38 - 126 U/L   Total Bilirubin 0.6 0.3 - 1.2 mg/dL   GFR calc non Af Amer >60 >60 mL/min   GFR calc Af Amer >60 >60 mL/min   Anion gap 10 5 - 15    Comment: Performed at [facility], [address] [place], [place] [address]  Ethanol     Status: None   Collection Time: 05/12/19  4:18 PM  Result Value Ref Range   Alcohol, Ethyl (B) <10 <10 mg/dL    Comment: (NOTE) Lowest detectable limit for serum alcohol is 10 mg/dL. For medical purposes only. Performed at [facility], [address] [place], [place] [address]   Salicylate level     Status: Abnormal   Collection Time: 05/12/19  4:18 PM  Result Value Ref Range   Salicylate Lvl <7.0 (L) 7.0 - 30.0 mg/dL    Comment: Performed at [facility], [address] [place], [place] [address]  Acetaminophen level     Status: Abnormal   Collection Time: 05/12/19  4:18 PM  Result Value Ref Range   Acetaminophen (Tylenol), Serum <10 (L) 10 - 30 ug/mL    Comment: (NOTE) Therapeutic concentrations vary significantly. A range of 10-30 ug/mL  may be an effective concentration for many patients. However, some  are best treated at concentrations outside of this range. Acetaminophen concentrations >150 ug/mL at 4 hours after ingestion  and >50 ug/mL at 12 hours after ingestion are often associated with  toxic reactions. Performed at [facility], [address] [place], [place] [address]   cbc     Status: None   Collection Time: 05/12/19  4:18 PM  Result Value Ref Range   WBC 8.0 4.0 - 10.5 K/uL   RBC 4.34 3.87 - 5.11 MIL/uL   Hemoglobin 12.8 12.0 - 15.0 g/dL   HCT 68.1 27.5 - 17.0 %   MCV 89.6 80.0 - 100.0 fL   MCH 29.5 26.0 - 34.0 pg   MCHC 32.9 30.0 - 36.0 g/dL   RDW 01.7 49.4 - 49.6 %   Platelets 314 150 - 400 K/uL   nRBC 0.0 0.0 - 0.2 %    Comment: Performed at [facility], [address], [place], [place] [address]  Urine Drug Screen, Qualitative     Status: None   Collection Time: 05/12/19  4:18 PM  Result Value Ref Range   Tricyclic, Ur Screen NONE DETECTED NONE DETECTED   Amphetamines, Ur Screen NONE DETECTED NONE DETECTED   MDMA (Ecstasy)Ur Screen NONE DETECTED NONE DETECTED   Cocaine Metabolite,Ur [place] NONE DETECTED NONE DETECTED   Opiate, Ur Screen NONE DETECTED NONE DETECTED   Phencyclidine (PCP) Ur S NONE DETECTED NONE DETECTED   Cannabinoid 50 Ng, Ur [place] NONE DETECTED NONE DETECTED   Barbiturates, Ur Screen NONE DETECTED NONE DETECTED   Benzodiazepine, Ur Scrn NONE DETECTED NONE DETECTED   Methadone Scn, Ur NONE DETECTED NONE DETECTED    Comment: (NOTE) Tricyclics + metabolites, urine    Cutoff 1000 ng/mL Amphetamines + metabolites, urine  Cutoff 1000 ng/mL MDMA (Ecstasy), urine  Cutoff 500 ng/mL Cocaine Metabolite, urine          Cutoff 300 ng/mL Opiate + metabolites, urine        Cutoff 300 ng/mL Phencyclidine (PCP), urine         Cutoff 25 ng/mL Cannabinoid, urine                 Cutoff 50 ng/mL Barbiturates + metabolites, urine  Cutoff 200 ng/mL Benzodiazepine, urine              Cutoff 200 ng/mL Methadone, urine                   Cutoff 300 ng/mL The urine drug screen provides only a preliminary, unconfirmed analytical test result and should not be used for non-medical purposes. Clinical consideration and professional judgment should be applied to any positive drug screen result due to possible interfering substances. A more specific alternate chemical method must be used in order to obtain a confirmed analytical result. Gas chromatography / mass spectrometry (GC/MS) is the preferred confirmat ory method. Performed at [facility], [address] [place], [place] [address]   Pregnancy, urine POC     Status: None   Collection Time: 05/12/19  4:38 PM  Result Value Ref Range   Preg Test, Ur NEGATIVE NEGATIVE    Comment:        THE SENSITIVITY OF THIS METHODOLOGY IS >24 mIU/mL   Respiratory Panel by RT PCR (Flu A&B, Covid) - Nasopharyngeal Swab     Status: None   Collection Time: 05/12/19  5:43 PM   Specimen: Nasopharyngeal Swab  Result Value Ref Range   SARS Coronavirus 2 by RT PCR NEGATIVE NEGATIVE    Comment: (NOTE) SARS-CoV-2 target nucleic acids are NOT DETECTED. The SARS-CoV-2 RNA is generally detectable in upper respiratoy specimens during the acute phase of infection. The lowest concentration of SARS-CoV-2 viral copies this assay can detect is 131 copies/mL. A negative result does not preclude SARS-Cov-2 infection and should not be used as the sole basis for treatment or other patient management decisions. A negative result may occur with  improper specimen collection/handling, submission of specimen other than  nasopharyngeal swab, presence of viral mutation(s) within the areas targeted by this assay, and inadequate number of viral copies (<131 copies/mL). A negative result must be combined with clinical observations, patient history, and epidemiological information. The expected result is Negative. Fact Sheet for Patients:  PinkCheek.be Fact Sheet for Healthcare Providers:  GravelBags.it This test is not yet ap proved or cleared by the [place] FDA and  has been authorized for detection and/or diagnosis of SARS-CoV-2 by FDA under an Emergency Use Authorization (EUA). This EUA will remain  in effect (meaning this test can be used) for the duration of the COVID-19 declaration under Section 564(b)(1) of the Act, 21 U.S.C. section 360bbb-3(b)(1), unless the authorization is terminated or revoked sooner.    Influenza A by PCR NEGATIVE NEGATIVE   Influenza B by PCR NEGATIVE NEGATIVE    Comment: (NOTE) The Xpert Xpress SARS-CoV-2/FLU/RSV assay is intended as an aid in  the diagnosis of influenza from Nasopharyngeal swab specimens and  should not be used as a sole basis for treatment. Nasal washings and  aspirates are unacceptable for Xpert Xpress SARS-CoV-2/FLU/RSV  testing. Fact Sheet for Patients: PinkCheek.be Fact Sheet for Healthcare Providers: GravelBags.it This test is not yet approved or cleared by the [place] FDA and  has been authorized for detection and/or diagnosis of  SARS-CoV-2 by  FDA under an Emergency Use Authorization (EUA). This EUA will remain  in effect (meaning this test can be used) for the duration of the  Covid-19 declaration under Section 564(b)(1) of the Act, 21  U.S.C. section 360bbb-3(b)(1), unless the authorization is  terminated or revoked. Performed at [facility], [address] [place], [place] [address]     Current  Facility-Administered Medications  Medication Dose Route Frequency Provider Last Rate Last Admin  . hydrOXYzine (ATARAX/VISTARIL) tablet 25 mg  25 mg Oral TID PRN Phineas Semen, MD      . sulfamethoxazole-trimethoprim (BACTRIM DS) 800-160 MG per tablet 1 tablet  1 tablet Oral Q12H Phineas Semen, MD   1 tablet at 05/12/19 2137   Current Outpatient Medications  Medication Sig Dispense Refill  . hydrOXYzine (ATARAX/VISTARIL) 25 MG tablet Take 1 tablet (25 mg total) by mouth 3 (three) times daily as needed for anxiety. 15 tablet 0  . ibuprofen (ADVIL) 400 MG tablet Take 1 tablet (400 mg total) by mouth every 8 (eight) hours as needed for moderate pain. 30 tablet 0  . sulfamethoxazole-trimethoprim (BACTRIM DS) 800-160 MG tablet Take 1 tablet by mouth every 12 (twelve) hours for 10 days. 20 tablet 0    Musculoskeletal: Strength & Muscle Tone: within normal limits Gait & Station: normal Patient leans: N/A  Psychiatric Specialty Exam: Physical Exam  Nursing note and vitals reviewed. Constitutional: She is oriented to person, place, and time. She appears well-developed.  Respiratory: Effort normal.  Musculoskeletal:        General: Normal range of motion.     Cervical back: Normal range of motion and neck supple.  Neurological: She is alert and oriented to person, place, and time.    Review of Systems  Psychiatric/Behavioral: Positive for agitation, behavioral problems and sleep disturbance. The patient is nervous/anxious.   All other systems reviewed and are negative.   Blood pressure (!) 113/59, pulse 92, temperature (!) 97.5 F (36.4 C), temperature source Oral, resp. rate 20, height 5' (1.524 m), weight 36.3 kg, SpO2 99 %, currently breastfeeding.Body mass index is 15.62 kg/m.  General Appearance: Casual  Eye Contact:  Good  Speech:  Clear and Coherent  Volume:  Increased  Mood:  Anxious, Irritable and Worthless  Affect:  Congruent, Restricted and Tearful  Thought Process:   Coherent  Orientation:  Full (Time, Place, and Person)  Thought Content:  Logical  Suicidal Thoughts:  No  Homicidal Thoughts:  No  Memory:  Immediate;   Good Recent;   Good Remote;   Good  Judgement:  Poor  Insight:  Lacking  Psychomotor Activity:  Increased  Concentration:  Concentration: Poor and Attention Span: Poor  Recall:  Good  Fund of Knowledge:  Fair  Language:  Good  Akathisia:  Negative  Handed:  Right  AIMS (if indicated):     Assets:  Communication Skills Desire for Improvement Resilience Social Support  ADL's:  Intact  Cognition:  WNL  Sleep:        Treatment Plan Summary: Medication management and Plan Patient meets criteria for psychiatric inpatient admission  Disposition: Recommend psychiatric Inpatient admission when medically cleared. Supportive therapy provided about ongoing stressors.  Gillermo Murdoch, NP 05/13/2019 1:08 AM

## 2019-05-13 NOTE — ED Notes (Signed)
Hourly rounding reveals patient sleeping in room. No complaints, stable, in no acute distress. Q15 minute rounds and monitoring via Security Cameras to continue. 

## 2019-05-13 NOTE — ED Notes (Signed)
Pt in dayroom; reports she awoke from a bad dream and was frightened; pt escorted back to room and reassured of her safety on unit and that she was in view of staff at all times; pt voices understanding

## 2019-05-13 NOTE — ED Notes (Signed)
BEHAVIORAL HEALTH ROUNDING Patient sleeping: NO Patient alert and oriented: YES Behavior appropriate: YES Nutrition and fluids offered: YES Toileting and hygiene offered: YES Sitter present: NO Law enforcement present: YES 

## 2019-05-13 NOTE — BH Assessment (Signed)
TTS contacted [facility] regarding acceptance that was reported yesterday on 05/12/19, Per intake staff Bianca patient has not been accepted. This Clinical research associate re-faxed information to [facility] for review.

## 2019-05-14 DIAGNOSIS — F1123 Opioid dependence with withdrawal: Secondary | ICD-10-CM

## 2019-05-14 DIAGNOSIS — Z22322 Carrier or suspected carrier of Methicillin resistant Staphylococcus aureus: Secondary | ICD-10-CM

## 2019-05-14 DIAGNOSIS — F1994 Other psychoactive substance use, unspecified with psychoactive substance-induced mood disorder: Secondary | ICD-10-CM

## 2019-05-14 DIAGNOSIS — F1920 Other psychoactive substance dependence, uncomplicated: Secondary | ICD-10-CM

## 2019-05-14 MED ORDER — LOPERAMIDE HCL 2 MG PO CAPS: 2.0000 mg | ORAL_CAPSULE | ORAL | Status: AC | PRN

## 2019-05-14 MED ORDER — CLONIDINE HCL 0.1 MG PO TABS
0.1000 mg | ORAL_TABLET | Freq: Four times a day (QID) | ORAL | Status: DC
  Administered 2019-05-14: 0.1 mg via ORAL
  Filled 2019-05-14: qty 1

## 2019-05-14 MED ORDER — METHOCARBAMOL 500 MG PO TABS
500.0000 mg | ORAL_TABLET | Freq: Three times a day (TID) | ORAL | Status: AC | PRN
  Administered 2019-05-14 – 2019-05-19 (×5): 500 mg via ORAL
  Filled 2019-05-14 (×5): qty 1

## 2019-05-14 MED ORDER — NAPROXEN 500 MG PO TABS
500.0000 mg | ORAL_TABLET | Freq: Two times a day (BID) | ORAL | Status: AC | PRN
  Administered 2019-05-16 – 2019-05-18 (×3): 500 mg via ORAL
  Filled 2019-05-14 (×3): qty 1

## 2019-05-14 MED ORDER — CLONIDINE HCL 0.1 MG PO TABS: 0.1000 mg | ORAL_TABLET | ORAL | Status: DC

## 2019-05-14 MED ORDER — DICYCLOMINE HCL 20 MG PO TABS
20.0000 mg | ORAL_TABLET | Freq: Four times a day (QID) | ORAL | Status: AC | PRN
  Administered 2019-05-19: 20 mg via ORAL
  Filled 2019-05-14 (×2): qty 1

## 2019-05-14 MED ORDER — TRAZODONE HCL 50 MG PO TABS
50.0000 mg | ORAL_TABLET | Freq: Every evening | ORAL | Status: DC | PRN
  Administered 2019-05-14 – 2019-05-15 (×2): 50 mg via ORAL
  Filled 2019-05-14 (×2): qty 1

## 2019-05-14 MED ORDER — MUPIROCIN CALCIUM 2 % EX CREA
TOPICAL_CREAM | Freq: Two times a day (BID) | CUTANEOUS | Status: DC
  Administered 2019-05-17: 1 via TOPICAL
  Filled 2019-05-14: qty 15

## 2019-05-14 MED ORDER — ONDANSETRON 4 MG PO TBDP
4.0000 mg | ORAL_TABLET | Freq: Four times a day (QID) | ORAL | Status: AC | PRN
  Administered 2019-05-18: 4 mg via ORAL
  Filled 2019-05-14: qty 1

## 2019-05-14 MED ORDER — CLONIDINE HCL 0.1 MG PO TABS: 0.1000 mg | ORAL_TABLET | Freq: Every day | ORAL | Status: DC
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
## 2019-05-14 NOTE — [facility] Group Notes (Signed)
LCSW Relapse Prevention and Social Support Group Note   05/14/2019 1300  Type of Group and Topic: Psychoeducational Group:  Relapse prevention and social support.   Participation Level:  Did not attend.  Description of Group  Relapse prevention and developing social support group identifies mental health triggers and early warning signs as a first step towards developing appropriate coping skills.  This can include a relapse of mental health or substance use symptoms.  With the help of examples, patients are encouraged to recognize and intervene when symptoms return before a crisis occurs.  Patients are also engaged to evaluate their current support network and to consider ways to increase and broaden that network.    Therapeutic Goals 1. Patients will identify triggers and early symptoms related to both mental health and substance use relapses. 2. Patients will begin the process of identifying plans/coping skills to manage these symptoms before they escalate to a crisis. 3. Patients will consider individuals in their current support network, whether they are positive or negative supports, and look at options to increase the number of positive supports in their network.    Summary of Patient Progress     Therapeutic Modalities: Cognitive Behavioral Therapy Psychoeducation    Greg Afifa Truax, MSW, LCSW  

## 2019-05-14 NOTE — Tx Team (Signed)
Initial Treatment Plan 05/14/2019 12:39 AM Jill Shepherd GYI:[id number]    PATIENT STRESSORS: Marital or family conflict Substance abuse   PATIENT STRENGTHS: Motivation for treatment/growth Supportive family/friends   PATIENT IDENTIFIED PROBLEMS: Substance Abuse  Anxiety  Depression                 DISCHARGE CRITERIA:  Motivation to continue treatment in a less acute level of care Verbal commitment to aftercare and medication compliance  PRELIMINARY DISCHARGE PLAN: Outpatient therapy Return to previous living arrangement  PATIENT/FAMILY INVOLVEMENT: This treatment plan has been presented to and reviewed with the patient, Jill Shepherd. The patient has been given the opportunity to ask questions and make suggestions.  Elmyra Ricks, RN 05/14/2019, 12:39 AM

## 2019-05-14 NOTE — Tx Team (Signed)
Interdisciplinary Treatment and Diagnostic Plan Update  05/14/2019 Time of Session: 0940 Jill Shepherd MRN: [id number]  Principal Diagnosis: <principal problem not specified>  Secondary Diagnoses: Active Problems:   Bipolar 1 disorder (HCC)   Current Medications:  Current Facility-Administered Medications  Medication Dose Route Frequency Provider Last Rate Last Admin  . acetaminophen (TYLENOL) tablet 650 mg  650 mg Oral Q6H PRN Caroline Sauger, NP      . alum & mag hydroxide-simeth (MAALOX/MYLANTA) 200-200-20 MG/5ML suspension 30 mL  30 mL Oral Q4H PRN Caroline Sauger, NP      . cloNIDine (CATAPRES) tablet 0.1 mg  0.1 mg Oral QID Sharma Covert, MD       Followed by  . [START ON 05/16/2019] cloNIDine (CATAPRES) tablet 0.1 mg  0.1 mg Oral BH-qamhs Clary, Cordie Grice, MD       Followed by  . [START ON 05/18/2019] cloNIDine (CATAPRES) tablet 0.1 mg  0.1 mg Oral QAC breakfast Sharma Covert, MD      . dicyclomine (BENTYL) tablet 20 mg  20 mg Oral Q6H PRN Sharma Covert, MD      . hydrOXYzine (ATARAX/VISTARIL) tablet 50 mg  50 mg Oral Q6H PRN Deloria Lair, NP   50 mg at 05/13/19 2201  . loperamide (IMODIUM) capsule 2-4 mg  2-4 mg Oral PRN Sharma Covert, MD      . magnesium hydroxide (MILK OF MAGNESIA) suspension 30 mL  30 mL Oral Daily PRN Caroline Sauger, NP      . methocarbamol (ROBAXIN) tablet 500 mg  500 mg Oral Q8H PRN Sharma Covert, MD      . mupirocin cream (BACTROBAN) 2 %   Topical BID Sharma Covert, MD      . naproxen (NAPROSYN) tablet 500 mg  500 mg Oral BID PRN Sharma Covert, MD      . ondansetron (ZOFRAN-ODT) disintegrating tablet 4 mg  4 mg Oral Q6H PRN Sharma Covert, MD      . sulfamethoxazole-trimethoprim (BACTRIM DS) 800-160 MG per tablet 1 tablet  1 tablet Oral Q12H Caroline Sauger, NP   1 tablet at 05/14/19 0806   PTA Medications: Medications Prior to Admission  Medication Sig Dispense Refill Last Dose  .  hydrOXYzine (ATARAX/VISTARIL) 25 MG tablet Take 1 tablet (25 mg total) by mouth 3 (three) times daily as needed for anxiety. 15 tablet 0   . ibuprofen (ADVIL) 400 MG tablet Take 1 tablet (400 mg total) by mouth every 8 (eight) hours as needed for moderate pain. 30 tablet 0   . sulfamethoxazole-trimethoprim (BACTRIM DS) 800-160 MG tablet Take 1 tablet by mouth every 12 (twelve) hours for 10 days. 20 tablet 0     Patient Stressors: Marital or family conflict Substance abuse  Patient Strengths: Motivation for treatment/growth Supportive family/friends  Treatment Modalities: Medication Management, Group therapy, Case management,  1 to 1 session with clinician, Psychoeducation, Recreational therapy.   Physician Treatment Plan for Primary Diagnosis: <principal problem not specified> Long Term Goal(s):     Short Term Goals:    Medication Management: Evaluate patient's response, side effects, and tolerance of medication regimen.  Therapeutic Interventions: 1 to 1 sessions, Unit Group sessions and Medication administration.  Evaluation of Outcomes: Not Met  Physician Treatment Plan for Secondary Diagnosis: Active Problems:   Bipolar 1 disorder ([place])  Long Term Goal(s):     Short Term Goals:       Medication Management: Evaluate patient's response, side effects, and tolerance  of medication regimen.  Therapeutic Interventions: 1 to 1 sessions, Unit Group sessions and Medication administration.  Evaluation of Outcomes: Not Met   RN Treatment Plan for Primary Diagnosis: <principal problem not specified> Long Term Goal(s): Knowledge of disease and therapeutic regimen to maintain health will improve  Short Term Goals: Ability to identify and develop effective coping behaviors will improve and Compliance with prescribed medications will improve  Medication Management: RN will administer medications as ordered by provider, will assess and evaluate patient's response and provide education  to patient for prescribed medication. RN will report any adverse and/or side effects to prescribing provider.  Therapeutic Interventions: 1 on 1 counseling sessions, Psychoeducation, Medication administration, Evaluate responses to treatment, Monitor vital signs and CBGs as ordered, Perform/monitor CIWA, COWS, AIMS and Fall Risk screenings as ordered, Perform wound care treatments as ordered.  Evaluation of Outcomes: Not Met   LCSW Treatment Plan for Primary Diagnosis: <principal problem not specified> Long Term Goal(s): Safe transition to appropriate next level of care at discharge, Engage patient in therapeutic group addressing interpersonal concerns.  Short Term Goals: Engage patient in aftercare planning with referrals and resources, Increase social support and Increase skills for wellness and recovery  Therapeutic Interventions: Assess for all discharge needs, 1 to 1 time with Social worker, Explore available resources and support systems, Assess for adequacy in community support network, Educate family and significant other(s) on suicide prevention, Complete Psychosocial Assessment, Interpersonal group therapy.  Evaluation of Outcomes: Not Met   Progress in Treatment: Attending groups: No. Participating in groups: No. Taking medication as prescribed: Yes. Toleration medication: Yes. Family/Significant other contact made: No, will contact:  when given permission Patient understands diagnosis: Yes. Discussing patient identified problems/goals with staff: Yes. Medical problems stabilized or resolved: Yes. Denies suicidal/homicidal ideation: Yes. Issues/concerns per patient self-inventory: No. Other: none  New problem(s) identified: No, Describe:  none  New Short Term/Long Term Goal(s):  Patient Goals:  "get clean, get help, avoid withdrawal"  Discharge Plan or Barriers:   Reason for Continuation of Hospitalization: Depression Medication stabilization  Estimated Length of  Stay: 3-5 days.  Attendees: Patient: Jill Shepherd. (pt has MRSA, cannot sign) 05/14/2019   Physician: Dr Mallie Darting, MD 05/14/2019   Nursing:  05/14/2019   RN Care Manager: 05/14/2019   Social Worker: Lurline Idol, LCSW 05/14/2019   Recreational Therapist:  05/14/2019   Other:  05/14/2019   Other:  05/14/2019   Other: 05/14/2019        Scribe for Treatment Team: Joanne Chars, [place] 05/14/2019 9:51 AM

## 2019-05-14 NOTE — Plan of Care (Signed)
Patient oriented to unit. Patient's safety is maintained on unit. Patient denies SI/HI/AVH.  Patient has multiple wounds. Wounds are assessed. RN To provider verbal request for topical. Patient is educated on contact precautions.   Problem: Education: Goal: Knowledge of [place] [facility] Education information/materials will improve Outcome: Not Progressing Goal: Emotional status will improve Outcome: Not Progressing Goal: Mental status will improve Outcome: Not Progressing Goal: Verbalization of understanding the information provided will improve Outcome: Not Progressing

## 2019-05-14 NOTE — H&P (Signed)
Psychiatric Admission Assessment Adult  Patient Identification: Jill Shepherd MRN:  [id number] Date of Evaluation:  05/14/2019 Chief Complaint:  Bipolar 1 disorder (HCC) [F31.9] Principal Diagnosis: <principal problem not specified> Diagnosis:  Active Problems:   Bipolar 1 disorder (HCC)  History of Present Illness: Patient is seen and examined.  Patient is a 24 year old female with a past psychiatric history significant for polysubstance use disorders who presented to the [facility] emergency department under involuntary commitment by the local mental health center.  The patient stated that her stepmother misunderstood her when the patient expressed suicidal ideation.  She stated she had been going through a great deal of things, and " if people would listen to me I had be okay".  The patient stated that she was on drugs and was using IV drugs, and that she had been losing her mind.  The patient reported a death in her family that her father had died, and that the patient had loss several pregnancy secondary to miscarriage.  She also reported emotional trauma after having a stillborn child at age 84.  Reportedly her father died in May 31, 2015.  The patient reported to regular use of methamphetamines and heroin.  She stated she had previously been prescribed Suboxone, and was getting that from [facility], but did not follow-up and was taking her mother's Suboxone.  Her major complaint today is that she is going through withdrawal, and she is afraid it is going to get much worse.  She also complained of anxiety symptoms and not sleeping.  She denied suicidal ideation, homicidal ideation, auditory hallucinations or visual hallucinations.  She did request that she get some Ensure for additional nutrition.  She was admitted to the hospital for evaluation and stabilization.  It should be noted that the patient had cultures done of wounds on her skin, and she is MRSA positive.  She was prescribed Bactrim  in the emergency room for urinary tract infection, but this should also cover her MRSA.  Associated Signs/Symptoms: Depression Symptoms:  anhedonia, insomnia, psychomotor agitation, fatigue, feelings of worthlessness/guilt, difficulty concentrating, hopelessness, suicidal thoughts without plan, anxiety, loss of energy/fatigue, disturbed sleep, (Hypo) Manic Symptoms:  Impulsivity, Irritable Mood, Labiality of Mood, Anxiety Symptoms:  Excessive Worry, Psychotic Symptoms:  Denied PTSD Symptoms: Had a traumatic exposure:  In the past Total Time spent with patient: 45 minutes  Past Psychiatric History: No previous psychiatric admissions.  She has been treated as an outpatient for substance abuse issues including being treated with Suboxone at [facility] locally.  She reports a history of PTSD and depression as well as anxiety.  Is the patient at risk to self? No.  Has the patient been a risk to self in the past 6 months? No.  Has the patient been a risk to self within the distant past? No.  Is the patient a risk to others? No.  Has the patient been a risk to others in the past 6 months? No.  Has the patient been a risk to others within the distant past? No.   Prior Inpatient Therapy:   Prior Outpatient Therapy:    Alcohol Screening: 1. How often do you have a drink containing alcohol?: Never 2. How many drinks containing alcohol do you have on a typical day when you are drinking?: 1 or 2 3. How often do you have six or more drinks on one occasion?: Never AUDIT-C Score: 0 4. How often during the last year have you found that you were not able to  stop drinking once you had started?: Never 5. How often during the last year have you failed to do what was normally expected from you becasue of drinking?: Never 6. How often during the last year have you needed a first drink in the morning to get yourself going after a heavy drinking session?: Never 7. How often during the last year have you  had a feeling of guilt of remorse after drinking?: Never 8. How often during the last year have you been unable to remember what happened the night before because you had been drinking?: Never 9. Have you or someone else been injured as a result of your drinking?: No 10. Has a relative or friend or a doctor or another health worker been concerned about your drinking or suggested you cut down?: No Alcohol Use Disorder Identification Test Final Score (AUDIT): 0 Alcohol Brief Interventions/Follow-up: AUDIT Score <7 follow-up not indicated Substance Abuse History in the last 12 months:  Yes.   Consequences of Substance Abuse: Medical Consequences:  Clearly this is impacted this current hospitalization. Previous Psychotropic Medications: Yes  Psychological Evaluations: Yes  Past Medical History:  Past Medical History:  Diagnosis Date  . Drug abuse ([place])   . Headache   . SAB (spontaneous abortion) 01/19/2014   stillborn at 20.4 wks    Past Surgical History:  Procedure Laterality Date  . INCISION AND DRAINAGE ABSCESS Left 05/07/2019   Procedure: INCISION AND DRAINAGE ABSCESS;  Surgeon: Jules Husbands, MD;  Location: [facility] ORS;  Service: General;  Laterality: Left;   Family History:  Family History  Problem Relation Age of Onset  . Hypertension Father   . Hypertension Paternal Grandfather    Family Psychiatric  History: Her mother is apparently an opiate dependent person and takes Suboxone. Tobacco Screening:   Social History:  Social History   Substance and Sexual Activity  Alcohol Use No  . Alcohol/week: 0.0 standard drinks     Social History   Substance and Sexual Activity  Drug Use Yes  . Types: Other-see comments, Amphetamines, Fentanyl, Heroin   Comment: subutex tx    Additional Social History:                           Allergies:  No Known Allergies Lab Results:  Results for orders placed or performed during the hospital encounter of 05/12/19 (from the past  48 hour(s))  Comprehensive metabolic panel     Status: Abnormal   Collection Time: 05/12/19  4:18 PM  Result Value Ref Range   Sodium 138 135 - 145 mmol/L   Potassium 4.7 3.5 - 5.1 mmol/L   Chloride 99 98 - 111 mmol/L   CO2 29 22 - 32 mmol/L   Glucose, Bld 120 (H) 70 - 99 mg/dL    Comment: Glucose reference range applies only to samples taken after fasting for at least 8 hours.   BUN 13 6 - 20 mg/dL   Creatinine, Ser 0.70 0.44 - 1.00 mg/dL   Calcium 10.0 8.9 - 10.3 mg/dL   Total Protein 7.8 6.5 - 8.1 g/dL   Albumin 4.4 3.5 - 5.0 g/dL   AST 55 (H) 15 - 41 U/L   ALT 63 (H) 0 - 44 U/L   Alkaline Phosphatase 81 38 - 126 U/L   Total Bilirubin 0.6 0.3 - 1.2 mg/dL   GFR calc non Af Amer >60 >60 mL/min   GFR calc Af Amer >60 >60 mL/min  Anion gap 10 5 - 15    Comment: Performed at [facility], [address] [place], [place] [address]  Ethanol     Status: None   Collection Time: 05/12/19  4:18 PM  Result Value Ref Range   Alcohol, Ethyl (B) <10 <10 mg/dL    Comment: (NOTE) Lowest detectable limit for serum alcohol is 10 mg/dL. For medical purposes only. Performed at [facility], [address] [place], [place] [address]   Salicylate level     Status: Abnormal   Collection Time: 05/12/19  4:18 PM  Result Value Ref Range   Salicylate Lvl <7.0 (L) 7.0 - 30.0 mg/dL    Comment: Performed at [facility], [address] [place], [place] [address]  Acetaminophen level     Status: Abnormal   Collection Time: 05/12/19  4:18 PM  Result Value Ref Range   Acetaminophen (Tylenol), Serum <10 (L) 10 - 30 ug/mL    Comment: (NOTE) Therapeutic concentrations vary significantly. A range of 10-30 ug/mL  may be an effective concentration for many patients. However, some  are best treated at concentrations outside of this range. Acetaminophen concentrations >150 ug/mL at 4 hours after ingestion  and >50 ug/mL at 12 hours after ingestion are often associated with   toxic reactions. Performed at [facility], [address] [place], [place] [address]   cbc     Status: None   Collection Time: 05/12/19  4:18 PM  Result Value Ref Range   WBC 8.0 4.0 - 10.5 K/uL   RBC 4.34 3.87 - 5.11 MIL/uL   Hemoglobin 12.8 12.0 - 15.0 g/dL   HCT 75.6 43.3 - 29.5 %   MCV 89.6 80.0 - 100.0 fL   MCH 29.5 26.0 - 34.0 pg   MCHC 32.9 30.0 - 36.0 g/dL   RDW 18.8 41.6 - 60.6 %   Platelets 314 150 - 400 K/uL   nRBC 0.0 0.0 - 0.2 %    Comment: Performed at [facility], [address], [place], [place] [address]  Urine Drug Screen, Qualitative     Status: None   Collection Time: 05/12/19  4:18 PM  Result Value Ref Range   Tricyclic, Ur Screen NONE DETECTED NONE DETECTED   Amphetamines, Ur Screen NONE DETECTED NONE DETECTED   MDMA (Ecstasy)Ur Screen NONE DETECTED NONE DETECTED   Cocaine Metabolite,Ur [place] NONE DETECTED NONE DETECTED   Opiate, Ur Screen NONE DETECTED NONE DETECTED   Phencyclidine (PCP) Ur S NONE DETECTED NONE DETECTED   Cannabinoid 50 Ng, Ur [place] NONE DETECTED NONE DETECTED   Barbiturates, Ur Screen NONE DETECTED NONE DETECTED   Benzodiazepine, Ur Scrn NONE DETECTED NONE DETECTED   Methadone Scn, Ur NONE DETECTED NONE DETECTED    Comment: (NOTE) Tricyclics + metabolites, urine    Cutoff 1000 ng/mL Amphetamines + metabolites, urine  Cutoff 1000 ng/mL MDMA (Ecstasy), urine              Cutoff 500 ng/mL Cocaine Metabolite, urine          Cutoff 300 ng/mL Opiate + metabolites, urine        Cutoff 300 ng/mL Phencyclidine (PCP), urine         Cutoff 25 ng/mL Cannabinoid, urine                 Cutoff 50 ng/mL Barbiturates + metabolites, urine  Cutoff 200 ng/mL Benzodiazepine, urine  Cutoff 200 ng/mL Methadone, urine                   Cutoff 300 ng/mL The urine drug screen provides only a preliminary, unconfirmed analytical test result and should not be used for non-medical purposes. Clinical consideration and professional  judgment should be applied to any positive drug screen result due to possible interfering substances. A more specific alternate chemical method must be used in order to obtain a confirmed analytical result. Gas chromatography / mass spectrometry (GC/MS) is the preferred confirmat ory method. Performed at [facility], [address] [place], [place] [address]   Pregnancy, urine POC     Status: None   Collection Time: 05/12/19  4:38 PM  Result Value Ref Range   Preg Test, Ur NEGATIVE NEGATIVE    Comment:        THE SENSITIVITY OF THIS METHODOLOGY IS >24 mIU/mL   Respiratory Panel by RT PCR (Flu A&B, Covid) - Nasopharyngeal Swab     Status: None   Collection Time: 05/12/19  5:43 PM   Specimen: Nasopharyngeal Swab  Result Value Ref Range   SARS Coronavirus 2 by RT PCR NEGATIVE NEGATIVE    Comment: (NOTE) SARS-CoV-2 target nucleic acids are NOT DETECTED. The SARS-CoV-2 RNA is generally detectable in upper respiratoy specimens during the acute phase of infection. The lowest concentration of SARS-CoV-2 viral copies this assay can detect is 131 copies/mL. A negative result does not preclude SARS-Cov-2 infection and should not be used as the sole basis for treatment or other patient management decisions. A negative result may occur with  improper specimen collection/handling, submission of specimen other than nasopharyngeal swab, presence of viral mutation(s) within the areas targeted by this assay, and inadequate number of viral copies (<131 copies/mL). A negative result must be combined with clinical observations, patient history, and epidemiological information. The expected result is Negative. Fact Sheet for Patients:  https://www.moore.com/ Fact Sheet for Healthcare Providers:  https://www.young.biz/ This test is not yet ap proved or cleared by the [place] FDA and  has been authorized for detection and/or diagnosis of  SARS-CoV-2 by FDA under an Emergency Use Authorization (EUA). This EUA will remain  in effect (meaning this test can be used) for the duration of the COVID-19 declaration under Section 564(b)(1) of the Act, 21 U.S.C. section 360bbb-3(b)(1), unless the authorization is terminated or revoked sooner.    Influenza A by PCR NEGATIVE NEGATIVE   Influenza B by PCR NEGATIVE NEGATIVE    Comment: (NOTE) The Xpert Xpress SARS-CoV-2/FLU/RSV assay is intended as an aid in  the diagnosis of influenza from Nasopharyngeal swab specimens and  should not be used as a sole basis for treatment. Nasal washings and  aspirates are unacceptable for Xpert Xpress SARS-CoV-2/FLU/RSV  testing. Fact Sheet for Patients: https://www.moore.com/ Fact Sheet for Healthcare Providers: https://www.young.biz/ This test is not yet approved or cleared by the [place] FDA and  has been authorized for detection and/or diagnosis of SARS-CoV-2 by  FDA under an Emergency Use Authorization (EUA). This EUA will remain  in effect (meaning this test can be used) for the duration of the  Covid-19 declaration under Section 564(b)(1) of the Act, 21  U.S.C. section 360bbb-3(b)(1), unless the authorization is  terminated or revoked. Performed at [facility], [address], [place], [place] [address]     Blood Alcohol level:  Lab Results  Component Value Date   [facility] <10 05/12/2019   ETH <10 03/26/2019  Metabolic Disorder Labs:  No results found for: HGBA1C, MPG No results found for: PROLACTIN No results found for: CHOL, TRIG, HDL, CHOLHDL, VLDL, LDLCALC  Current Medications: Current Facility-Administered Medications  Medication Dose Route Frequency Provider Last Rate Last Admin  . acetaminophen (TYLENOL) tablet 650 mg  650 mg Oral Q6H PRN Gillermo Murdoch, NP      . alum & mag hydroxide-simeth (MAALOX/MYLANTA) 200-200-20 MG/5ML suspension 30 mL  30 mL Oral Q4H PRN  Gillermo Murdoch, NP      . cloNIDine (CATAPRES) tablet 0.1 mg  0.1 mg Oral QID Antonieta Pert, MD   0.1 mg at 05/14/19 1048   Followed by  . [START ON 05/16/2019] cloNIDine (CATAPRES) tablet 0.1 mg  0.1 mg Oral BH-qamhs Seriah Brotzman, Marlane Mingle, MD       Followed by  . [START ON 05/18/2019] cloNIDine (CATAPRES) tablet 0.1 mg  0.1 mg Oral QAC breakfast Antonieta Pert, MD      . dicyclomine (BENTYL) tablet 20 mg  20 mg Oral Q6H PRN Antonieta Pert, MD      . hydrOXYzine (ATARAX/VISTARIL) tablet 50 mg  50 mg Oral Q6H PRN Jearld Lesch, NP   50 mg at 05/14/19 1048  . loperamide (IMODIUM) capsule 2-4 mg  2-4 mg Oral PRN Antonieta Pert, MD      . magnesium hydroxide (MILK OF MAGNESIA) suspension 30 mL  30 mL Oral Daily PRN Gillermo Murdoch, NP      . methocarbamol (ROBAXIN) tablet 500 mg  500 mg Oral Q8H PRN Antonieta Pert, MD      . mupirocin cream (BACTROBAN) 2 %   Topical BID Antonieta Pert, MD   Given at 05/14/19 1221  . naproxen (NAPROSYN) tablet 500 mg  500 mg Oral BID PRN Antonieta Pert, MD      . ondansetron (ZOFRAN-ODT) disintegrating tablet 4 mg  4 mg Oral Q6H PRN Antonieta Pert, MD      . sulfamethoxazole-trimethoprim (BACTRIM DS) 800-160 MG per tablet 1 tablet  1 tablet Oral Q12H Gillermo Murdoch, NP   1 tablet at 05/14/19 0806  . traZODone (DESYREL) tablet 50 mg  50 mg Oral QHS PRN Antonieta Pert, MD       PTA Medications: Medications Prior to Admission  Medication Sig Dispense Refill Last Dose  . hydrOXYzine (ATARAX/VISTARIL) 25 MG tablet Take 1 tablet (25 mg total) by mouth 3 (three) times daily as needed for anxiety. 15 tablet 0   . ibuprofen (ADVIL) 400 MG tablet Take 1 tablet (400 mg total) by mouth every 8 (eight) hours as needed for moderate pain. 30 tablet 0   . sulfamethoxazole-trimethoprim (BACTRIM DS) 800-160 MG tablet Take 1 tablet by mouth every 12 (twelve) hours for 10 days. 20 tablet 0     Musculoskeletal: Strength & Muscle  Tone: within normal limits Gait & Station: normal Patient leans: N/A  Psychiatric Specialty Exam: Physical Exam  Nursing note and vitals reviewed. Constitutional: She is oriented to person, place, and time. She appears well-developed and well-nourished.  HENT:  Head: Normocephalic and atraumatic.  Respiratory: Effort normal.  Neurological: She is alert and oriented to person, place, and time.    Review of Systems  Blood pressure (!) 78/54, pulse 93, temperature 97.6 F (36.4 C), temperature source Oral, resp. rate 17, height 5' (1.524 m), weight 39.5 kg, SpO2 98 %, currently breastfeeding.Body mass index is 16.99 kg/m.  General Appearance: Casual  Eye Contact:  Fair  Speech:  Normal Rate  Volume:  Normal  Mood:  Anxious  Affect:  Congruent  Thought Process:  Coherent and Descriptions of Associations: Circumstantial  Orientation:  Full (Time, Place, and Person)  Thought Content:  Logical  Suicidal Thoughts:  No  Homicidal Thoughts:  No  Memory:  Immediate;   Fair Recent;   Fair Remote;   Fair  Judgement:  Impaired  Insight:  Lacking  Psychomotor Activity:  Increased  Concentration:  Concentration: Fair and Attention Span: Fair  Recall:  [employer] of Knowledge:  Fair  Language:  Good  Akathisia:  Negative  Handed:  Right  AIMS (if indicated):     Assets:  Desire for Improvement Resilience  ADL's:  Intact  Cognition:  WNL  Sleep:  Number of Hours: 4.5    Treatment Plan Summary: Daily contact with patient to assess and evaluate symptoms and progress in treatment, Medication management and Plan : Patient is seen and examined.  Patient is a 24 year old female with a past psychiatric history significant for polysubstance dependence.  She will be admitted to the psychiatric unit.  She will be encouraged to attend groups.  She will be encouraged to work on her coping skills.  She is more focused right now on withdrawal of substances.  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  Review of her laboratories revealed a mildly elevated glucose, elevated liver function enzymes with an AST of 55 and an ALT of 63.  Her CK was significantly elevated on 04/18/2019.  Her CRP on 4/9 was 2.9.  This is most likely secondary to her skin infections.  Her CBC was normal on admission.  Sed rate was only 14.  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  HIV was negative.  Her urine drug screen showed 0-5 white blood cells, but rare bacteria.  Most likely the urine infection is probably minimal.  Her pregnancy test was negative.  Blood alcohol was less than 10.  Drug screen including opiates were negative.  Her skin culture from 4/10 revealed methicillin-resistant Staph aureus.  It is sensitive to Bactrim.  Bactroban will also be applied to her wounds.  I have notified infection control that she is on the psychiatric unit, and we will further await their recommendations.  I did order contact isolation precautions.  We will do the opiate detox protocol.  She has requested Suboxone, but I have told her we do not have that available for detox.  Outside of the detox protocol I will write for trazodone 50 mg p.o. nightly as needed insomnia and we will continue the hydroxyzine 50 mg every 6 hours as needed anxiety.  Observation Level/Precautions:  Detox 15 minute checks  Laboratory:  Chemistry Profile  Psychotherapy:    Medications:    Consultations:    Discharge Concerns:    Estimated LOS:  Other:     Physician Treatment Plan for Primary Diagnosis: <principal problem not specified> Long Term Goal(s): Improvement in symptoms so as ready for discharge  Short Term Goals: Ability to identify changes in lifestyle to reduce recurrence of condition will improve, Ability to verbalize feelings will improve, Ability to disclose and discuss suicidal ideas, Ability to  demonstrate self-control will improve, Ability to identify and develop effective coping behaviors will improve, Ability to maintain clinical measurements within normal limits will improve and Ability to identify triggers associated with substance abuse/mental health  issues will improve  Physician Treatment Plan for Secondary Diagnosis: Active Problems:   Bipolar 1 disorder (HCC)  Long Term Goal(s): Improvement in symptoms so as ready for discharge  Short Term Goals: Ability to identify changes in lifestyle to reduce recurrence of condition will improve, Ability to verbalize feelings will improve, Ability to disclose and discuss suicidal ideas, Ability to demonstrate self-control will improve, Ability to identify and develop effective coping behaviors will improve, Ability to maintain clinical measurements within normal limits will improve and Ability to identify triggers associated with substance abuse/mental health issues will improve  I certify that inpatient services furnished can reasonably be expected to improve the patient's condition.    Antonieta PertGreg Lawson Alanea Woolridge, MD 4/17/20212:42 PM

## 2019-05-14 NOTE — BHH Suicide Risk Assessment (Signed)
[facility] Admission Suicide Risk Assessment   Nursing information obtained from:  Patient Demographic factors:  Caucasian, Unemployed Current Mental Status:  Self-harm thoughts Loss Factors:  Financial problems / change in socioeconomic status, Decline in physical health Historical Factors:  Impulsivity Risk Reduction Factors:  Responsible for children under 23 years of age, Living with another person, especially a relative, Positive social support  Total Time spent with patient: 30 minutes Principal Problem: <principal problem not specified> Diagnosis:  Active Problems:   Bipolar 1 disorder ([place])  Subjective Data: Patient is seen and examined.  Patient is a 24 year old female with a past psychiatric history significant for polysubstance use disorders who presented to the [facility] emergency department under involuntary commitment by the local mental health center.  The patient stated that her stepmother misunderstood her when the patient expressed suicidal ideation.  She stated she had been going through a great deal of things, and " if people would listen to me I had be okay".  The patient stated that she was on drugs and was using IV drugs, and that she had been losing her mind.  The patient reported a death in her family that her father had died, and that the patient had loss several pregnancy secondary to miscarriage.  She also reported emotional trauma after having a stillborn child at age 34.  Reportedly her father died in 2015-06-06.  The patient reported to regular use of methamphetamines and heroin.  She stated she had previously been prescribed Suboxone, and was getting that from [place], but did not follow-up and was taking her mother's Suboxone.  Her major complaint today is that she is going through withdrawal, and she is afraid it is going to get much worse.  She also complained of anxiety symptoms and not sleeping.  She denied suicidal ideation, homicidal ideation, auditory  hallucinations or visual hallucinations.  She did request that she get some Ensure for additional nutrition.  She was admitted to the hospital for evaluation and stabilization.  It should be noted that the patient had cultures done of wounds on her skin, and she is MRSA positive.  She was prescribed Bactrim in the emergency room for urinary tract infection, but this should also cover her MRSA.  Continued Clinical Symptoms:  Alcohol Use Disorder Identification Test Final Score (AUDIT): 0 The "Alcohol Use Disorders Identification Test", Guidelines for Use in Primary Care, Second Edition.  World Pharmacologist [facility]). Score between 0-7:  no or low risk or alcohol related problems. Score between 8-15:  moderate risk of alcohol related problems. Score between 16-19:  high risk of alcohol related problems. Score 20 or above:  warrants further diagnostic evaluation for alcohol dependence and treatment.   CLINICAL FACTORS:   Depression:   Anhedonia Comorbid alcohol abuse/dependence Hopelessness Impulsivity Insomnia Alcohol/Substance Abuse/Dependencies   Musculoskeletal: Strength & Muscle Tone: within normal limits Gait & Station: normal Patient leans: N/A  Psychiatric Specialty Exam: Physical Exam  Nursing note and vitals reviewed. Constitutional: She is oriented to person, place, and time. She appears well-developed and well-nourished.  HENT:  Head: Normocephalic and atraumatic.  Respiratory: Effort normal.  Neurological: She is alert and oriented to person, place, and time.    Review of Systems  Blood pressure (!) 78/54, pulse 93, temperature 97.6 F (36.4 C), temperature source Oral, resp. rate 17, height 5' (1.524 m), weight 39.5 kg, SpO2 98 %, currently breastfeeding.Body mass index is 16.99 kg/m.  General Appearance: Casual  Eye Contact:  Fair  Speech:  Normal Rate  Volume:  Normal  Mood:  Anxious  Affect:  Congruent  Thought Process:  Coherent and Descriptions of  Associations: Circumstantial  Orientation:  Full (Time, Place, and Person)  Thought Content:  Logical  Suicidal Thoughts:  No  Homicidal Thoughts:  No  Memory:  Immediate;   Fair Recent;   Fair Remote;   Fair  Judgement:  Intact  Insight:  Fair  Psychomotor Activity:  Increased  Concentration:  Concentration: Fair and Attention Span: Fair  Recall:  [employer] of Knowledge:  Fair  Language:  Good  Akathisia:  Negative  Handed:  Right  AIMS (if indicated):     Assets:  Desire for Improvement Resilience  ADL's:  Intact  Cognition:  WNL  Sleep:  Number of Hours: 4.5      COGNITIVE FEATURES THAT CONTRIBUTE TO RISK:  None    SUICIDE RISK:   Minimal: No identifiable suicidal ideation.  Patients presenting with no risk factors but with morbid ruminations; may be classified as minimal risk based on the severity of the depressive symptoms  PLAN OF CARE: Patient is seen and examined.  Patient is a 24 year old female with a past psychiatric history significant for polysubstance dependence.  She will be admitted to the psychiatric unit.  She will be encouraged to attend groups.  She will be encouraged to work on her coping skills.  She is more focused right now on withdrawal of substances.  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  Review of her laboratories revealed a mildly elevated glucose, elevated liver function enzymes with an AST of 55 and an ALT of 63.  Her CK was significantly elevated on 04/18/2019.  Her CRP on 4/9 was 2.9.  This is most likely secondary to her skin infections.  Her CBC was normal on admission.  Sed rate was only 14.  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  HIV was negative.  Her urine drug screen showed 0-5 white blood cells, but rare bacteria.  Most likely the urine infection is probably minimal.  Her pregnancy test was  negative.  Blood alcohol was less than 10.  Drug screen including opiates were negative.  Her skin culture from 4/10 revealed methicillin-resistant Staph aureus.  It is sensitive to Bactrim.  Bactroban will also be applied to her wounds.  I have notified infection control that she is on the psychiatric unit, and we will further await their recommendations.  I did order contact isolation precautions.  We will do the opiate detox protocol.  She has requested Suboxone, but I have told her we do not have that available for detox.  Outside of the detox protocol I will write for trazodone 50 mg p.o. nightly as needed insomnia and we will continue the hydroxyzine 50 mg every 6 hours as needed anxiety.  I certify that inpatient services furnished can reasonably be expected to improve the patient's condition.   Antonieta Pert, MD 05/14/2019, 1:48 PM

## 2019-05-15 NOTE — Progress Notes (Signed)
Patient presents hyper verbal with increased anxiety. Denies any SI, HI, AHV. Pleasant and cooperative with care. Appropriate with staff and peers. Medication and group compliant. No symptoms of withdrawal noted. Reports sleeping well on last night with medication. Pt goal today is to work on self, sobriety and staying clean.  Encouragement and support offered. Safety checks maintained. Medications given as prescribed. Pt receptive and remains safe on unit with q 15 min checks.

## 2019-05-15 NOTE — Progress Notes (Signed)
[facility] MD Progress Note  05/15/2019 11:16 AM Jill Shepherd  MRN:  [id number] Subjective: Patient is a 24 year old female with a past psychiatric history significant for polysubstance use disorders who presented to the [facility] emergency department under involuntary commitment by the [facility] after expressing suicidal ideation.  Objective: Patient is seen and examined.  Patient is a 24 year old female with the above-stated past psychiatric history who is seen in follow-up.  She is doing fine.  She is not really showing any signs or symptoms of any withdrawal syndromes.  She remains interested in residential substance abuse treatment, but social work informed me this a.m. that she has a court date on Wednesday.  She denied any suicidal or homicidal ideation.  She denied any auditory or visual hallucinations.  She is smiling and engaging today and participating in groups.  Her vital signs are stable, she is afebrile.  She slept 8 hours last night.  Review of her laboratories showed no new labs today.  I have written for contact isolation, but it does not appear as though anything is been in place currently.  She continues on Bactrim p.o. as well as Bactroban on her wounds.  Principal Problem: <principal problem not specified> Diagnosis: Active Problems:   Bipolar 1 disorder ([facility])  Total Time spent with patient: 15 minutes  Past Psychiatric History: See admission H&P  Past Medical History:  Past Medical History:  Diagnosis Date  . Drug abuse ([place])   . Headache   . SAB (spontaneous abortion) 01/19/2014   stillborn at 20.4 wks    Past Surgical History:  Procedure Laterality Date  . INCISION AND DRAINAGE ABSCESS Left 05/07/2019   Procedure: INCISION AND DRAINAGE ABSCESS;  Surgeon: Jules Husbands, MD;  Location: [facility] ORS;  Service: General;  Laterality: Left;   Family History:  Family History  Problem Relation Age of Onset  . Hypertension Father   .  Hypertension Paternal Grandfather    Family Psychiatric  History: See admission H&P Social History:  Social History   Substance and Sexual Activity  Alcohol Use No  . Alcohol/week: 0.0 standard drinks     Social History   Substance and Sexual Activity  Drug Use Yes  . Types: Other-see comments, Amphetamines, Fentanyl, Heroin   Comment: subutex tx    Social History   Socioeconomic History  . Marital status: Single    Spouse name: Not on file  . Number of children: 1  . Years of education: Not on file  . Highest education level: Not on file  Occupational History    Employer: [employer]  Tobacco Use  . Smoking status: Never Smoker  . Smokeless tobacco: Never Used  Substance and Sexual Activity  . Alcohol use: No    Alcohol/week: 0.0 standard drinks  . Drug use: Yes    Types: Other-see comments, Amphetamines, Fentanyl, Heroin    Comment: subutex tx  . Sexual activity: Yes    Partners: Male    Birth control/protection: None  Other Topics Concern  . Not on file  Social History Narrative  . Not on file   Social Determinants of Health   Financial Resource Strain:   . Difficulty of Paying Living Expenses:   Food Insecurity:   . Worried About Charity fundraiser in the Last Year:   . Arboriculturist in the Last Year:   Transportation Needs:   . Film/video editor (Medical):   Marland Kitchen Lack of Transportation (Non-Medical):  Physical Activity:   . Days of Exercise per Week:   . Minutes of Exercise per Session:   Stress:   . Feeling of Stress :   Social Connections:   . Frequency of Communication with Friends and Family:   . Frequency of Social Gatherings with Friends and Family:   . Attends Religious Services:   . Active Member of Clubs or Organizations:   . Attends Banker Meetings:   Marland Kitchen Marital Status:    Additional Social History:                         Sleep: Good  Appetite:  Good  Current Medications: Current  Facility-Administered Medications  Medication Dose Route Frequency Provider Last Rate Last Admin  . acetaminophen (TYLENOL) tablet 650 mg  650 mg Oral Q6H PRN Gillermo Murdoch, NP   650 mg at 05/14/19 2023  . alum & mag hydroxide-simeth (MAALOX/MYLANTA) 200-200-20 MG/5ML suspension 30 mL  30 mL Oral Q4H PRN Gillermo Murdoch, NP      . dicyclomine (BENTYL) tablet 20 mg  20 mg Oral Q6H PRN Antonieta Pert, MD      . hydrOXYzine (ATARAX/VISTARIL) tablet 50 mg  50 mg Oral Q6H PRN Jearld Lesch, NP   50 mg at 05/15/19 0820  . loperamide (IMODIUM) capsule 2-4 mg  2-4 mg Oral PRN Antonieta Pert, MD      . magnesium hydroxide (MILK OF MAGNESIA) suspension 30 mL  30 mL Oral Daily PRN Gillermo Murdoch, NP      . methocarbamol (ROBAXIN) tablet 500 mg  500 mg Oral Q8H PRN Antonieta Pert, MD   500 mg at 05/14/19 2023  . mupirocin cream (BACTROBAN) 2 %   Topical BID Antonieta Pert, MD   Given at 05/15/19 0820  . naproxen (NAPROSYN) tablet 500 mg  500 mg Oral BID PRN Antonieta Pert, MD      . ondansetron (ZOFRAN-ODT) disintegrating tablet 4 mg  4 mg Oral Q6H PRN Antonieta Pert, MD      . sulfamethoxazole-trimethoprim (BACTRIM DS) 800-160 MG per tablet 1 tablet  1 tablet Oral Q12H Gillermo Murdoch, NP   1 tablet at 05/15/19 0820  . traZODone (DESYREL) tablet 50 mg  50 mg Oral QHS PRN Antonieta Pert, MD   50 mg at 05/14/19 2023    Lab Results: No results found for this or any previous visit (from the past 48 hour(s)).  Blood Alcohol level:  Lab Results  Component Value Date   ETH <10 05/12/2019   ETH <10 03/26/2019    Metabolic Disorder Labs: No results found for: HGBA1C, MPG No results found for: PROLACTIN No results found for: CHOL, TRIG, HDL, CHOLHDL, VLDL, LDLCALC  Physical Findings: AIMS: Facial and Oral Movements Muscles of Facial Expression: None, normal Lips and Perioral Area: None, normal Jaw: None, normal Tongue: None, normal,Extremity  Movements Upper (arms, wrists, hands, fingers): None, normal Lower (legs, knees, ankles, toes): None, normal, Trunk Movements Neck, shoulders, hips: None, normal, Overall Severity Severity of abnormal movements (highest score from questions above): None, normal Incapacitation due to abnormal movements: None, normal Patient's awareness of abnormal movements (rate only patient's report): No Awareness, Dental Status Current problems with teeth and/or dentures?: No Does patient usually wear dentures?: No  CIWA:    COWS:  COWS Total Score: 2  Musculoskeletal: Strength & Muscle Tone: within normal limits Gait & Station: normal Patient leans: N/A  Psychiatric Specialty  Exam: Physical Exam  Nursing note and vitals reviewed. Constitutional: She is oriented to person, place, and time. She appears well-developed and well-nourished.  HENT:  Head: Normocephalic and atraumatic.  Respiratory: Effort normal.  Neurological: She is alert and oriented to person, place, and time.    Review of Systems  Blood pressure (!) 99/54, pulse 80, temperature 97.9 F (36.6 C), temperature source Oral, resp. rate 18, height 5' (1.524 m), weight 39.5 kg, SpO2 100 %, currently breastfeeding.Body mass index is 16.99 kg/m.  General Appearance: Casual  Eye Contact:  Good  Speech:  Normal Rate  Volume:  Normal  Mood:  Euthymic  Affect:  Congruent  Thought Process:  Coherent and Descriptions of Associations: Intact  Orientation:  Full (Time, Place, and Person)  Thought Content:  Logical  Suicidal Thoughts:  No  Homicidal Thoughts:  No  Memory:  Immediate;   Fair Recent;   Fair Remote;   Fair  Judgement:  Intact  Insight:  Fair  Psychomotor Activity:  Normal  Concentration:  Concentration: Good and Attention Span: Good  Recall:  Good  Fund of Knowledge:  Fair  Language:  Good  Akathisia:  Negative  Handed:  Right  AIMS (if indicated):     Assets:  Desire for Improvement Resilience  ADL's:  Intact   Cognition:  WNL  Sleep:  Number of Hours: 8     Treatment Plan Summary: Daily contact with patient to assess and evaluate symptoms and progress in treatment, Medication management and Plan : Patient is seen and examined.  Patient is a 24 year old female with the above-stated past psychiatric history who is seen in follow-up.   Diagnosis: #1 substance-induced mood disorder, #2 opiate dependence, #3 reported history of PTSD, #4 MRSA skin infection  Patient is seen in follow-up.  She is essentially unchanged from yesterday.  She is doing well, not having any withdrawal syndromes.  She has requested residential substance abuse treatment, but social worker informed today that she has a court date on Wednesday, April 22.  We discussed that today.  Social work will discuss with the patient and the options that she may have available.  She continues on Bactrim as well as Bactroban for her MRSA wound infections.  No changes to her medications today.  1.  Continue opiate detox protocol (clonidine component of this was stopped secondary to low blood pressure). 2.  Continue Bactrim DS 1 tablet p.o. twice daily for MRSA skin infection. 3.  Continue Bactroban to skin wounds for MRSA skin infection. 4.  Remind staff of contact isolation precautions. 5.  Continue trazodone 50 mg p.o. nightly as needed insomnia. 6.  Disposition planning-in progress.  Antonieta Pert, MD 05/15/2019, 11:16 AM

## 2019-05-15 NOTE — Progress Notes (Signed)
Care of patient taken over at 2300 and patient seemed to rest well through out the night, with no issues to report on shift at this time.  

## 2019-05-15 NOTE — Plan of Care (Signed)
  Problem: Education: Goal: Knowledge of [place] Education information/materials will improve Outcome: Progressing Goal: Emotional status will improve Outcome: Progressing Goal: Mental status will improve Outcome: Progressing Goal: Verbalization of understanding the information provided will improve Outcome: Progressing   Problem: Safety: Goal: Periods of time without injury will increase Outcome: Progressing   

## 2019-05-15 NOTE — BHH Suicide Risk Assessment (Signed)
B[facility] INPATIENT:  Family/Significant Other Suicide Prevention Education  Suicide Prevention Education:  Contact Attempts: Hope Budds, sister, [phone], has been identified by the patient as the family member/significant other with whom the patient will be residing, and identified as the person(s) who will aid the patient in the event of a mental health crisis.  With written consent from the patient, two attempts were made to provide suicide prevention education, prior to and/or following the patient's discharge.  We were unsuccessful in providing suicide prevention education.  A suicide education pamphlet was given to the patient to share with family/significant other.  Date and time of first attempt:05/15/19, 1201 Date and time of second attempt:  Lorri Frederick, LCSW 05/15/2019, 12:02 PM
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
## 2019-05-16 DIAGNOSIS — F319 Bipolar disorder, unspecified: Principal | ICD-10-CM

## 2019-05-16 MED ORDER — QUETIAPINE FUMARATE 25 MG PO TABS
50.0000 mg | ORAL_TABLET | Freq: Every day | ORAL | Status: DC
  Administered 2019-05-16: 50 mg via ORAL
  Filled 2019-05-16: qty 2

## 2019-05-16 NOTE — Progress Notes (Signed)
Patient pleasant and cooperative. Complains of headache in am, prn given with good relief. Pt complains of anxiety from being in milieu around a crowd, yet talks and laughs constantly with peers. Denies any SI, HI, AVH. Medication and group compliant.  Pt states has court case that she needs to attend and does not want to go to jail.  Encouragement and support provided. Safety checks maintained. meds given as prescribed, pt remains safe with q 15 min checks.

## 2019-05-16 NOTE — BHH Suicide Risk Assessment (Signed)
[facility] INPATIENT:  Family/Significant Other Suicide Prevention Education  Suicide Prevention Education:  Contact Attempts: Paulina Fusi, sister, [phone], has been identified by the patient as the family member/significant other with whom the patient will be residing, and identified as the person(s) who will aid the patient in the event of a mental health crisis.  With written consent from the patient, two attempts were made to provide suicide prevention education, prior to and/or following the patient's discharge.  We were unsuccessful in providing suicide prevention education.  A suicide education pamphlet was given to the patient to share with family/significant other.  Date and time of first attempt: 05/15/19, 1201 Date and time of second attempt: 05/16/2019 at 3:26PM  CSW left HIPAA compliant voicemail.  Harden Mo 05/16/2019, 3:26 PM

## 2019-05-16 NOTE — BHH Group Notes (Signed)
B[facility] Group Notes:  (Nursing/MHT/Case Management/Adjunct)  Date:  05/16/2019  Time:  10:35 AM  Type of Therapy:  Community Meeting  Participation Level:  Active  Participation Quality:  Appropriate and Attentive  Affect:  Appropriate  Cognitive:  Alert and Appropriate  Insight:  Appropriate  Engagement in Group:  Engaged  Modes of Intervention:  Discussion and Education  Summary of Progress/Problems:  Jill Shepherd 05/16/2019, 10:35 AM

## 2019-05-16 NOTE — Progress Notes (Signed)
Recreation Therapy Notes   Date: 05/16/2019  Time: 9:30 am  Location: Craft Room  Behavioral response: Appropriate   Intervention Topic: Self-esteem   Discussion/Intervention:  Group content today was focused on self-esteem. Patient defined self-esteem and where it comes form. The group described reasons self-esteem is important. Individuals stated things that impact self-esteem and positive ways to improve self-esteem. The group participated in the intervention "Building self-esteem" where patients were able come up with new ways to improve their self-esteem.  Clinical Observations/Feedback:  Patient came to group and expressed that self-esteem can be positive and negative. She explained that at this point in life she is trying to build up her self-esteem. Participant shared that she thinks that social media helps boosts her self-esteem. Patient stated that she likes doing her hair and make-up to boost her self-esteem. Individual was social with peers and staff while participating in the intervention.   [name] LRT/CTRS            [name] 05/16/2019 12:20 PM

## 2019-05-16 NOTE — Progress Notes (Signed)
Recreation Therapy Notes  INPATIENT RECREATION THERAPY ASSESSMENT  Patient Details Name: LARINA LIEURANCE MRN: [id number] DOB: 06/13/1995 Today's Date: 05/16/2019       Information Obtained From: Patient  Able to Participate in Assessment/Interview: Yes  Patient Presentation: Responsive  Reason for Admission (Per Patient): Active Symptoms, Substance Abuse  Patient Stressors: Death  Coping Skills:   Substance Abuse, Talk, Art, Read, Journal  Leisure Interests (2+):  Individual - Reading, Individual - Writing, Individual - Journaling, Social - Friends  Frequency of Recreation/Participation: Weekly  Awareness of Community Resources:     [employer]:     Current Use:    If no, Barriers?:    Expressed Interest in [employer] Information:    [place] of Residence:  Film/video editor  Patient Main Form of Transportation: Other (Comment)(My sister)  Patient Strengths:  My smile, optimistic  Patient Identified Areas of Improvement:  Self-esteem  Patient Goal for Hospitalization:  Not be so hopeless and get treatment  Current SI (including self-harm):  No  Current HI:  No  Current AVH: No  Staff Intervention Plan: Group Attendance, Collaborate with Interdisciplinary Treatment Team  Consent to Intern Participation: N/A  Kobie Matkins 05/16/2019, 1:34 PM

## 2019-05-16 NOTE — [facility] Group Notes (Signed)
[facility] Group Notes:  (Nursing/MHT/Case Management/Adjunct)  Date:  05/16/2019  Time:  11:56 AM  Type of Therapy:  Psychoeducational Skills  Participation Level:  Active  Participation Quality:  Appropriate  Affect:  Appropriate  Cognitive:  Alert and Appropriate  Insight:  Appropriate  Engagement in Group:  Engaged  Modes of Intervention:  Activity, Discussion, Education and Socialization  Summary of Progress/Problems:  Kerrie Pleasure 05/16/2019, 11:56 AM

## 2019-05-16 NOTE — [facility] Group Notes (Signed)
Emotional Regulation 05/16/2019 1PM  Type of Therapy/Topic:  Group Therapy:  Emotion Regulation  Participation Level:  Did Not Attend   Description of Group:   The purpose of this group is to assist patients in learning to regulate negative emotions and experience positive emotions. Patients will be guided to discuss ways in which they have been vulnerable to their negative emotions. These vulnerabilities will be juxtaposed with experiences of positive emotions or situations, and patients will be challenged to use positive emotions to combat negative ones. Special emphasis will be placed on coping with negative emotions in conflict situations, and patients will process healthy conflict resolution skills.  Therapeutic Goals: 1. Patient will identify two positive emotions or experiences to reflect on in order to balance out negative emotions 2. Patient will label two or more emotions that they find the most difficult to experience 3. Patient will demonstrate positive conflict resolution skills through discussion and/or role plays  Summary of Patient Progress:       Therapeutic Modalities:   Cognitive Behavioral Therapy Feelings Identification Dialectical Behavioral Therapy   Jill Hadlock T Gust Eugene, LCSW 05/16/2019 2:42 PM  

## 2019-05-16 NOTE — Plan of Care (Signed)
  Problem: Education: Goal: Knowledge of [facility] Education information/materials will improve Outcome: Progressing Goal: Emotional status will improve Outcome: Progressing Goal: Mental status will improve Outcome: Progressing Goal: Verbalization of understanding the information provided will improve Outcome: Progressing   Problem: Safety: Goal: Periods of time without injury will increase Outcome: Progressing   

## 2019-05-16 NOTE — Progress Notes (Signed)
[facility] MD Progress Note  05/16/2019 6:33 PM Jill Shepherd  MRN:  [id number] Subjective: Patient seen and chart reviewed.  This is a follow-up for this patient with multiple substance abuse problems and mood instability.  The patient herself reports that she continues to have a lot of mood instability and irritability rapid mood swings and behavior problems.  She has had treatment in the past for a diagnosis of bipolar disorder and has felt that medications had been helpful particularly Seroquel.  Patient has insight into her substance abuse issues and acknowledges that she needs to get back into treatment for them.  She is not currently reporting any active suicidal ideation or active psychotic problems.  We reviewed the concern about her skin infection.  Patient has some areas on both of her thumbs where the skin is peeling which appears to be the locus of the MRSA but this presumably may be something that came from another part of her skin as well.  She is not febrile or having acute pain in the area. 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  Also a history of chronic mood instability  Past Medical History:  Past Medical History:  Diagnosis Date  . Drug abuse (HCC)   . Headache   . SAB (spontaneous abortion) 01/19/2014   stillborn at 20.4 wks    Past Surgical History:  Procedure Laterality Date  . INCISION AND DRAINAGE ABSCESS Left 05/07/2019   Procedure: INCISION AND DRAINAGE ABSCESS;  Surgeon: Leafy Ro, MD;  Location: [facility] ORS;  Service: General;  Laterality: Left;   Family History:  Family History  Problem Relation Age of Onset  . Hypertension Father   . Hypertension Paternal Grandfather    Family Psychiatric  History: Patient reports that her mother has  bipolar disorder and that she sees a great deal of herself in her mother Social History:  Social History   Substance and Sexual Activity  Alcohol Use No  . Alcohol/week: 0.0 standard drinks     Social History   Substance and Sexual Activity  Drug Use Yes  . Types: Other-see comments, Amphetamines, Fentanyl, Heroin   Comment: subutex tx    Social History   Socioeconomic History  . Marital status: Legally Separated    Spouse name: Not on file  . Number of children: 1  . Years of education: Not on file  . Highest education level: Not on file  Occupational History    Employer: [employer]  Tobacco Use  . Smoking status: Never Smoker  . Smokeless tobacco: Never Used  Substance and Sexual Activity  . Alcohol use: No    Alcohol/week: 0.0 standard drinks  . Drug use: Yes    Types: Other-see comments, Amphetamines, Fentanyl, Heroin    Comment: subutex tx  . Sexual activity: Yes    Partners: Male    Birth control/protection: None  Other Topics Concern  . Not on file  Social History Narrative  . Not on file   Social Determinants of Health   Financial Resource Strain:   . Difficulty of Paying Living Expenses:   Food Insecurity:   . Worried About Programme researcher, broadcasting/film/video in the Last Year:   . Barista in the Last Year:   Transportation Needs:   . Freight forwarder (Medical):   Marland Kitchen  Lack of Transportation (Non-Medical):   Physical Activity:   . Days of Exercise per Week:   . Minutes of Exercise per Session:   Stress:   . Feeling of Stress :   Social Connections:   . Frequency of Communication with Friends and Family:   . Frequency of Social Gatherings with Friends and Family:   . Attends Religious Services:   . Active Member of Clubs or Organizations:   . Attends Archivist Meetings:   Marland Kitchen Marital Status:    Additional Social History:                         Sleep: Fair  Appetite:  Fair  Current Medications: Current Facility-Administered  Medications  Medication Dose Route Frequency Provider Last Rate Last Admin  . acetaminophen (TYLENOL) tablet 650 mg  650 mg Oral Q6H PRN Caroline Sauger, NP   650 mg at 05/14/19 2023  . alum & mag hydroxide-simeth (MAALOX/MYLANTA) 200-200-20 MG/5ML suspension 30 mL  30 mL Oral Q4H PRN Caroline Sauger, NP      . dicyclomine (BENTYL) tablet 20 mg  20 mg Oral Q6H PRN Sharma Covert, MD      . hydrOXYzine (ATARAX/VISTARIL) tablet 50 mg  50 mg Oral Q6H PRN Deloria Lair, NP   50 mg at 05/16/19 1621  . loperamide (IMODIUM) capsule 2-4 mg  2-4 mg Oral PRN Sharma Covert, MD      . magnesium hydroxide (MILK OF MAGNESIA) suspension 30 mL  30 mL Oral Daily PRN Caroline Sauger, NP      . methocarbamol (ROBAXIN) tablet 500 mg  500 mg Oral Q8H PRN Sharma Covert, MD   500 mg at 05/14/19 2023  . mupirocin cream (BACTROBAN) 2 %   Topical BID Sharma Covert, MD   Given at 05/16/19 1622  . naproxen (NAPROSYN) tablet 500 mg  500 mg Oral BID PRN Sharma Covert, MD   500 mg at 05/16/19 0831  . ondansetron (ZOFRAN-ODT) disintegrating tablet 4 mg  4 mg Oral Q6H PRN Sharma Covert, MD      . QUEtiapine (SEROQUEL) tablet 50 mg  50 mg Oral QHS Delfino Friesen T, MD      . sulfamethoxazole-trimethoprim (BACTRIM DS) 800-160 MG per tablet 1 tablet  1 tablet Oral Q12H Caroline Sauger, NP   1 tablet at 05/16/19 0830    Lab Results: No results found for this or any previous visit (from the past 48 hour(s)).  Blood Alcohol level:  Lab Results  Component Value Date   ETH <10 05/12/2019   ETH <10 84/69/6295    Metabolic Disorder Labs: No results found for: HGBA1C, MPG No results found for: PROLACTIN No results found for: CHOL, TRIG, HDL, CHOLHDL, VLDL, LDLCALC  Physical Findings: AIMS: Facial and Oral Movements Muscles of Facial Expression: None, normal Lips and Perioral Area: None, normal Jaw: None, normal Tongue: None, normal,Extremity Movements Upper (arms, wrists,  hands, fingers): None, normal Lower (legs, knees, ankles, toes): None, normal, Trunk Movements Neck, shoulders, hips: None, normal, Overall Severity Severity of abnormal movements (highest score from questions above): None, normal Incapacitation due to abnormal movements: None, normal Patient's awareness of abnormal movements (rate only patient's report): No Awareness, Dental Status Current problems with teeth and/or dentures?: No Does patient usually wear dentures?: No  CIWA:    COWS:  COWS Total Score: 2  Musculoskeletal: Strength & Muscle Tone: within normal limits Gait & Station: normal Patient  leans: N/A  Psychiatric Specialty Exam: Physical Exam  Nursing note and vitals reviewed. Constitutional: She appears well-developed and well-nourished.  HENT:  Head: Normocephalic and atraumatic.  Eyes: Pupils are equal, round, and reactive to light. Conjunctivae are normal.  Cardiovascular: Regular rhythm and normal heart sounds.  Respiratory: Effort normal. No respiratory distress.  GI: Soft.  Musculoskeletal:        General: Normal range of motion.     Cervical back: Normal range of motion.  Neurological: She is alert.  Skin: Skin is warm and dry.  Psychiatric: Her speech is normal and behavior is normal. Thought content normal. Her mood appears anxious. Cognition and memory are normal. She expresses impulsivity.    Review of Systems  Constitutional: Negative.   HENT: Negative.   Eyes: Negative.   Respiratory: Negative.   Cardiovascular: Negative.   Gastrointestinal: Negative.   Musculoskeletal: Negative.   Skin: Negative.   Neurological: Negative.   Psychiatric/Behavioral: Positive for behavioral problems and sleep disturbance. The patient is hyperactive.     Blood pressure 103/70, pulse 68, temperature 98 F (36.7 C), temperature source Oral, resp. rate 18, height 5' (1.524 m), weight 39.5 kg, SpO2 99 %, currently breastfeeding.Body mass index is 16.99 kg/m.  General  Appearance: Disheveled  Eye Contact:  Fair  Speech:  Pressured  Volume:  Increased  Mood:  Irritable  Affect:  Congruent  Thought Process:  Coherent  Orientation:  Full (Time, Place, and Person)  Thought Content:  Logical  Suicidal Thoughts:  No  Homicidal Thoughts:  No  Memory:  Immediate;   Fair Recent;   Fair Remote;   Fair  Judgement:  Fair  Insight:  Fair  Psychomotor Activity:  Normal  Concentration:  Concentration: Fair  Recall:  [employer] of Knowledge:  Fair  Language:  Fair  Akathisia:  No  Handed:  Right  AIMS (if indicated):     Assets:  Desire for Improvement Housing Resilience Social Support  ADL's:  Intact  Cognition:  WNL  Sleep:  Number of Hours: 6.25     Treatment Plan Summary: Daily contact with patient to assess and evaluate symptoms and progress in treatment, Medication management and Plan I agree with the patient that her history sounds like mood instability continues to be a problem even without the acute intoxication.  We agreed to start Seroquel 50 mg at night standing for now.  Patient is on oral antibiotics.  Communication was received today from the infection control department and we will follow-up with them tomorrow but in the meantime the patient remains on contact precautions.  Mordecai Rasmussen, MD 05/16/2019, 6:33 PM

## 2019-05-17 DIAGNOSIS — F319 Bipolar disorder, unspecified: Secondary | ICD-10-CM | POA: Diagnosis not present

## 2019-05-17 MED ORDER — NICOTINE 21 MG/24HR TD PT24
21.0000 mg | MEDICATED_PATCH | Freq: Every day | TRANSDERMAL | Status: DC
  Administered 2019-05-17 – 2019-05-18 (×2): 21 mg via TRANSDERMAL
  Filled 2019-05-17 (×2): qty 1

## 2019-05-17 MED ORDER — QUETIAPINE FUMARATE 100 MG PO TABS
100.0000 mg | ORAL_TABLET | Freq: Every day | ORAL | Status: DC
  Administered 2019-05-17 – 2019-05-18 (×2): 100 mg via ORAL
  Filled 2019-05-17 (×2): qty 1

## 2019-05-17 NOTE — Progress Notes (Signed)
Patient has been on the phone most of the shift. Mood is labile. Hyperactive. States "you need to give me some medication because I am getting on everyone's nerves". Denies SI, HI and AVH. Affect is appropriate to circumstance.

## 2019-05-17 NOTE — [facility] Group Notes (Signed)
LCSW Group Therapy Note  05/17/2019 1:00 PM  Type of Therapy/Topic:  Group Therapy:  Feelings about Diagnosis  Participation Level:  Minimal   Description of Group:   This group will allow patients to explore their thoughts and feelings about diagnoses they have received. Patients will be guided to explore their level of understanding and acceptance of these diagnoses. Facilitator will encourage patients to process their thoughts and feelings about the reactions of others to their diagnosis and will guide patients in identifying ways to discuss their diagnosis with significant others in their lives. This group will be process-oriented, with patients participating in exploration of their own experiences, giving and receiving support, and processing challenge from other group members.   Therapeutic Goals: 1. Patient will demonstrate understanding of diagnosis as evidenced by identifying two or more symptoms of the disorder 2. Patient will be able to express two feelings regarding the diagnosis 3. Patient will demonstrate their ability to communicate their needs through discussion and/or role play  Summary of Patient Progress: Patient was present for group. Patient was animated in group and discussed how she has struggled with her relationship with her mother and other family members who don't fully understand her mental health.   Therapeutic Modalities:   Cognitive Behavioral Therapy Brief Therapy Feelings Identification   Penni Homans, MSW, LCSW 05/17/2019 2:35 PM

## 2019-05-17 NOTE — Progress Notes (Signed)
[facility] MD Progress Note  05/17/2019 4:01 PM EMMARY Jill Shepherd  MRN:  [id number] Subjective: Patient seen and chart reviewed.  Patient says she is feeling a little better today.  She is [place] than she was yesterday.  Slept better last night.  She is still nervous and a little jittery but not loose in her thinking not psychotic.  Able to express herself more calmly.  Tolerated Seroquel well.  Patient reveals to me today that she has a serious court date tomorrow which I do not think we had known about previously.  She had not asked for [place] to contact the court about it. Principal Problem: Bipolar 1 disorder (HCC) Diagnosis: Principal Problem:   Bipolar 1 disorder (HCC) Active Problems:   Underweight   Methamphetamine abuse (HCC)  Total Time spent with patient: 30 minutes  Past Psychiatric History: Patient has a history of mood instability bipolar disorder multiple substance abuse problems including methamphetamine and opiates  Past Medical History:  Past Medical History:  Diagnosis Date  . Drug abuse (HCC)   . Headache   . SAB (spontaneous abortion) 01/19/2014   stillborn at 20.4 wks    Past Surgical History:  Procedure Laterality Date  . INCISION AND DRAINAGE ABSCESS Left 05/07/2019   Procedure: INCISION AND DRAINAGE ABSCESS;  Surgeon: Leafy Ro, MD;  Location: [facility] ORS;  Service: General;  Laterality: Left;   Family History:  Family History  Problem Relation Age of Onset  . Hypertension Father   . Hypertension Paternal Grandfather    Family Psychiatric  History: See previous.  Patient says her mother has bipolar disorder Social History:  Social History   Substance and Sexual Activity  Alcohol Use No  . Alcohol/week: 0.0 standard drinks     Social History   Substance and Sexual Activity  Drug Use Yes  . Types: Other-see comments, Amphetamines, Fentanyl, Heroin   Comment: subutex tx    Social History   Socioeconomic History  . Marital status: Legally Separated   Spouse name: Not on file  . Number of children: 1  . Years of education: Not on file  . Highest education level: Not on file  Occupational History    Employer: [employer]  Tobacco Use  . Smoking status: Never Smoker  . Smokeless tobacco: Never Used  Substance and Sexual Activity  . Alcohol use: No    Alcohol/week: 0.0 standard drinks  . Drug use: Yes    Types: Other-see comments, Amphetamines, Fentanyl, Heroin    Comment: subutex tx  . Sexual activity: Yes    Partners: Male    Birth control/protection: None  Other Topics Concern  . Not on file  Social History Narrative  . Not on file   Social Determinants of Health   Financial Resource Strain:   . Difficulty of Paying Living Expenses:   Food Insecurity:   . Worried About Programme researcher, broadcasting/film/video in the Last Year:   . Barista in the Last Year:   Transportation Needs:   . Freight forwarder (Medical):   Marland Kitchen Lack of Transportation (Non-Medical):   Physical Activity:   . Days of Exercise per Week:   . Minutes of Exercise per Session:   Stress:   . Feeling of Stress :   Social Connections:   . Frequency of Communication with Friends and Family:   . Frequency of Social Gatherings with Friends and Family:   . Attends Religious Services:   . Active Member of Clubs or  Organizations:   . Attends Archivist Meetings:   Marland Kitchen Marital Status:    Additional Social History:                         Sleep: Fair  Appetite:  Fair  Current Medications: Current Facility-Administered Medications  Medication Dose Route Frequency Provider Last Rate Last Admin  . acetaminophen (TYLENOL) tablet 650 mg  650 mg Oral Q6H PRN Caroline Sauger, NP   650 mg at 05/14/19 2023  . alum & mag hydroxide-simeth (MAALOX/MYLANTA) 200-200-20 MG/5ML suspension 30 mL  30 mL Oral Q4H PRN Caroline Sauger, NP      . dicyclomine (BENTYL) tablet 20 mg  20 mg Oral Q6H PRN Sharma Covert, MD      . hydrOXYzine  (ATARAX/VISTARIL) tablet 50 mg  50 mg Oral Q6H PRN Deloria Lair, NP   50 mg at 05/17/19 0829  . loperamide (IMODIUM) capsule 2-4 mg  2-4 mg Oral PRN Sharma Covert, MD      . magnesium hydroxide (MILK OF MAGNESIA) suspension 30 mL  30 mL Oral Daily PRN Caroline Sauger, NP      . methocarbamol (ROBAXIN) tablet 500 mg  500 mg Oral Q8H PRN Sharma Covert, MD   500 mg at 05/14/19 2023  . mupirocin cream (BACTROBAN) 2 %   Topical BID Sharma Covert, MD   1 application at 78/29/56 2130  . naproxen (NAPROSYN) tablet 500 mg  500 mg Oral BID PRN Sharma Covert, MD   500 mg at 05/16/19 2116  . nicotine (NICODERM CQ - dosed in mg/24 hours) patch 21 mg  21 mg Transdermal Daily Cordarrell Sane T, MD      . ondansetron (ZOFRAN-ODT) disintegrating tablet 4 mg  4 mg Oral Q6H PRN Sharma Covert, MD      . QUEtiapine (SEROQUEL) tablet 100 mg  100 mg Oral QHS Massiah Longanecker T, MD      . sulfamethoxazole-trimethoprim (BACTRIM DS) 800-160 MG per tablet 1 tablet  1 tablet Oral Q12H Caroline Sauger, NP   1 tablet at 05/17/19 8657    Lab Results: No results found for this or any previous visit (from the past 48 hour(s)).  Blood Alcohol level:  Lab Results  Component Value Date   ETH <10 05/12/2019   ETH <10 84/69/6295    Metabolic Disorder Labs: No results found for: HGBA1C, MPG No results found for: PROLACTIN No results found for: CHOL, TRIG, HDL, CHOLHDL, VLDL, LDLCALC  Physical Findings: AIMS: Facial and Oral Movements Muscles of Facial Expression: None, normal Lips and Perioral Area: None, normal Jaw: None, normal Tongue: None, normal,Extremity Movements Upper (arms, wrists, hands, fingers): None, normal Lower (legs, knees, ankles, toes): None, normal, Trunk Movements Neck, shoulders, hips: None, normal, Overall Severity Severity of abnormal movements (highest score from questions above): None, normal Incapacitation due to abnormal movements: None, normal Patient's  awareness of abnormal movements (rate only patient's report): No Awareness, Dental Status Current problems with teeth and/or dentures?: No Does patient usually wear dentures?: No  CIWA:    COWS:  COWS Total Score: 0  Musculoskeletal: Strength & Muscle Tone: within normal limits Gait & Station: normal Patient leans: N/A  Psychiatric Specialty Exam: Physical Exam  Nursing note and vitals reviewed. Constitutional: She appears well-developed and well-nourished.  HENT:  Head: Normocephalic and atraumatic.  Eyes: Pupils are equal, round, and reactive to light. Conjunctivae are normal.  Cardiovascular: Regular rhythm and normal  heart sounds.  Respiratory: Effort normal. No respiratory distress.  GI: Soft.  Musculoskeletal:        General: Normal range of motion.     Cervical back: Normal range of motion.  Neurological: She is alert.  Skin: Skin is warm and dry.  Psychiatric: Her speech is normal. Her mood appears anxious. Her affect is labile. Her affect is not blunt. She is agitated. She is not aggressive and not combative. Thought content is not paranoid. Cognition and memory are impaired. She expresses impulsivity. She expresses no homicidal and no suicidal ideation.    Review of Systems  Constitutional: Negative.   HENT: Negative.   Eyes: Negative.   Respiratory: Negative.   Cardiovascular: Negative.   Gastrointestinal: Negative.   Musculoskeletal: Negative.   Skin: Negative.   Neurological: Negative.   Psychiatric/Behavioral: The patient is nervous/anxious.     Blood pressure (!) 113/92, pulse 82, temperature 97.6 F (36.4 C), temperature source Oral, resp. rate 18, height 5' (1.524 m), weight 39.5 kg, SpO2 100 %, currently breastfeeding.Body mass index is 16.99 kg/m.  General Appearance: Casual  Eye Contact:  Fair  Speech:  Clear and Coherent  Volume:  Normal  Mood:  Euthymic  Affect:  Anxious a little jittery still  Thought Process:  Coherent  Orientation:  Full  (Time, Place, and Person)  Thought Content:  Logical  Suicidal Thoughts:  No  Homicidal Thoughts:  No  Memory:  Immediate;   Fair Recent;   Fair Remote;   Fair  Judgement:  Impaired  Insight:  Fair  Psychomotor Activity:  Restlessness  Concentration:  Concentration: Fair  Recall:  Poor  Fund of Knowledge:  Fair  Language:  Fair  Akathisia:  No  Handed:  Right  AIMS (if indicated):     Assets:  Desire for Improvement  ADL's:  Intact  Cognition:  Impaired,  Mild  Sleep:  Number of Hours: 5.5     Treatment Plan Summary: Daily contact with patient to assess and evaluate symptoms and progress in treatment, Medication management and Plan We discussed the pros and cons of the medication and decided to increase Seroquel to 100 mg at night.  Continue rest of the treatment plan for now.  Likely discharge 1 to 2 days.  Mordecai Rasmussen, MD 05/17/2019, 4:01 PM

## 2019-05-17 NOTE — Plan of Care (Signed)
  Problem: Education: Goal: Knowledge of [facility] Education information/materials will improve Outcome: Progressing Goal: Emotional status will improve Outcome: Progressing Goal: Mental status will improve Outcome: Progressing Goal: Verbalization of understanding the information provided will improve Outcome: Progressing   

## 2019-05-17 NOTE — Progress Notes (Signed)
Recreation Therapy Notes   Date: 05/17/2019  Time: 9:30 am  Location: Craft Room  Behavioral response: Appropriate   Intervention Topic: Goals  Discussion/Intervention:  Group content on today was focused on goals. Patients described what goals are and how they define goals. Individuals expressed how they go about setting goals and reaching them. The group identified how important goals are and if they make short term goals to reach long term goals. Patients described how many goals they work on at a time and what affects them not reaching their goal. Individuals described how much time they put into planning and obtaining their goals. The group participated in the intervention "My Goal Board" and made personal goal boards to help them achieve their goal. Clinical Observations/Feedback:  Patient came to group and defined goals as things that help you strive for the future. She stated that her goal is to stay off drugs and be a better mother/woman. Participant explained that life has made her make goals and time is to precious to waste.  Individual was social with peers and staff while participating in the intervention.   Ishmael Berkovich LRT/CTRS             Kane Kusek 05/17/2019 12:40 PM

## 2019-05-17 NOTE — [facility] Group Notes (Signed)
[facility] Group Notes:  (Nursing/MHT/Case Management/Adjunct)  Date:  05/17/2019  Time:  9:17 AM  Type of Therapy:  Community Meeting  Participation Level:  Did Not Attend   Jill Shepherd Jill Shepherd 05/17/2019, 9:17 AM 

## 2019-05-17 NOTE — Plan of Care (Signed)
Patient is pleasant and cooperative on approach.Patient verbalized insight about substance abuse and likes to go the rehab program.Patient states " I have lots going on." Patient likes to take Vistaril when it is time.Attended groups.Appetite and energy level good.Support and encouragement given.

## 2019-05-17 NOTE — B[facility] Group Notes (Signed)
[facility] Group Notes:  (Nursing/MHT/Case Management/Adjunct)  Date:  05/17/2019  Time:  10:02 PM  Type of Therapy:  Group Therapy  Participation Level:  Active  Participation Quality:  Appropriate and Sharing  Affect:  Appropriate and Tearful  Cognitive:  Appropriate  Insight:  Good  Engagement in Group:  Engaged and Supportive  Modes of Intervention:  Discussion and Support  Summary of Progress/Problems: PT wants to get cleaned, get her mind straight and get better for her son; focus on her self, be helpful and make other laugh/smile  Landry Mellow 05/17/2019, 10:02 PM

## 2019-05-17 NOTE — Progress Notes (Signed)
   05/17/19 1400  Clinical Encounter Type  Visited With Patient;Other (Comment)  Visit Type Initial;Spiritual support;Behavioral Health  Referral From Chaplain  Consult/Referral To Chaplain  Patient participated in group on Dealing with your thoughts and assumptions. Patient was encouraged by her praying while in [facility] ED. She and Chaz prayed and claim miracles were done. Patient would like to help other young girls and encourage not to do things that she has done. When asked where she is going from here, she said maybe to a rehab and then a women's group home. Patient said that she cannot continue life as it was. She is working to get her son back. She also told me about her son that died. She is dealing with death of her son and her father. She said that her mother wasn't not a good, but they are getting closer. Chaplain suggested maybe her mother was the best mother she knew how to be and patient agreed. She appears to be hopeful and motivated to stay clean.

## 2019-05-18 NOTE — Progress Notes (Signed)
[facility] MD Progress Note  05/18/2019 2:13 PM Jill Shepherd  MRN:  [id number]   Subjective: Follow-up for this 24 year old female diagnosed with bipolar 1 disorder.  Patient reports that today she is feeling much better but is still very anxious.  She states that her anxiety is due to her knowing that she has a court date that was supposed to be today for methamphetamine trafficking.  She states that she actually has 3 felonies that was filed against her and she is concerned because she may be facing up to 8 years in prison due to this.  She denies any suicidal or homicidal ideations and denies any depression or hallucinations.  She does states that she is just concerned that if she does not come out of this recently that she may not have anyone that she can rely on anymore.  She is afraid that her family may stop trying to help her.  Patient reports that she feels that she is ready to discharge from the hospital and is considering going to the [facility] rescue mission.  Patient denies any medication side effects and reports that she has been eating and sleeping well.  Principal Problem: Bipolar 1 disorder ([employer]) Diagnosis: Principal Problem:   Bipolar 1 disorder ([place]) Active Problems:   Underweight   Methamphetamine abuse ([place])  Total Time spent with patient: 20 minutes  Past Psychiatric History: No previous psychiatric admissions.  She has been treated as an outpatient for substance abuse issues including being treated with Suboxone at [facility] locally.  She reports a history of PTSD and depression as well as anxiety  Past Medical History:  Past Medical History:  Diagnosis Date  . Drug abuse ([place])   . Headache   . SAB (spontaneous abortion) 01/19/2014   stillborn at 20.4 wks    Past Surgical History:  Procedure Laterality Date  . INCISION AND DRAINAGE ABSCESS Left 05/07/2019   Procedure: INCISION AND DRAINAGE ABSCESS;  Surgeon: Jules Husbands, MD;  Location: [facility] ORS;  Service: General;  Laterality:  Left;   Family History:  Family History  Problem Relation Age of Onset  . Hypertension Father   . Hypertension Paternal Grandfather    Family Psychiatric  History: Her mother is apparently an opiate dependent person and takes Suboxone. Social History:  Social History   Substance and Sexual Activity  Alcohol Use No  . Alcohol/week: 0.0 standard drinks     Social History   Substance and Sexual Activity  Drug Use Yes  . Types: Other-see comments, Amphetamines, Fentanyl, Heroin   Comment: subutex tx    Social History   Socioeconomic History  . Marital status: Legally Separated    Spouse name: Not on file  . Number of children: 1  . Years of education: Not on file  . Highest education level: Not on file  Occupational History    Employer: [employer]  Tobacco Use  . Smoking status: Never Smoker  . Smokeless tobacco: Never Used  Substance and Sexual Activity  . Alcohol use: No    Alcohol/week: 0.0 standard drinks  . Drug use: Yes    Types: Other-see comments, Amphetamines, Fentanyl, Heroin    Comment: subutex tx  . Sexual activity: Yes    Partners: Male    Birth control/protection: None  Other Topics Concern  . Not on file  Social History Narrative  . Not on file   Social Determinants of Health   Financial Resource Strain:   . Difficulty of Paying Living Expenses:  Food Insecurity:   . Worried About Programme researcher, broadcasting/film/video in the Last Year:   . Barista in the Last Year:   Transportation Needs:   . Freight forwarder (Medical):   Marland Kitchen Lack of Transportation (Non-Medical):   Physical Activity:   . Days of Exercise per Week:   . Minutes of Exercise per Session:   Stress:   . Feeling of Stress :   Social Connections:   . Frequency of Communication with Friends and Family:   . Frequency of Social Gatherings with Friends and Family:   . Attends Religious Services:   . Active Member of Clubs or Organizations:   . Attends Banker Meetings:   Marland Kitchen  Marital Status:    Additional Social History:                         Sleep: Good  Appetite:  Good  Current Medications: Current Facility-Administered Medications  Medication Dose Route Frequency Provider Last Rate Last Admin  . acetaminophen (TYLENOL) tablet 650 mg  650 mg Oral Q6H PRN Gillermo Murdoch, NP   650 mg at 05/14/19 2023  . alum & mag hydroxide-simeth (MAALOX/MYLANTA) 200-200-20 MG/5ML suspension 30 mL  30 mL Oral Q4H PRN Gillermo Murdoch, NP      . dicyclomine (BENTYL) tablet 20 mg  20 mg Oral Q6H PRN Antonieta Pert, MD      . hydrOXYzine (ATARAX/VISTARIL) tablet 50 mg  50 mg Oral Q6H PRN Jearld Lesch, NP   50 mg at 05/18/19 0817  . loperamide (IMODIUM) capsule 2-4 mg  2-4 mg Oral PRN Antonieta Pert, MD      . magnesium hydroxide (MILK OF MAGNESIA) suspension 30 mL  30 mL Oral Daily PRN Gillermo Murdoch, NP      . methocarbamol (ROBAXIN) tablet 500 mg  500 mg Oral Q8H PRN Antonieta Pert, MD   500 mg at 05/18/19 1151  . mupirocin cream (BACTROBAN) 2 %   Topical BID Antonieta Pert, MD   Given at 05/18/19 0801  . naproxen (NAPROSYN) tablet 500 mg  500 mg Oral BID PRN Antonieta Pert, MD   500 mg at 05/16/19 2116  . nicotine (NICODERM CQ - dosed in mg/24 hours) patch 21 mg  21 mg Transdermal Daily Clapacs, Jackquline Denmark, MD   21 mg at 05/17/19 1608  . ondansetron (ZOFRAN-ODT) disintegrating tablet 4 mg  4 mg Oral Q6H PRN Antonieta Pert, MD      . QUEtiapine (SEROQUEL) tablet 100 mg  100 mg Oral QHS Clapacs, Jackquline Denmark, MD   100 mg at 05/17/19 2123  . sulfamethoxazole-trimethoprim (BACTRIM DS) 800-160 MG per tablet 1 tablet  1 tablet Oral Q12H Latrail Pounders, Gerlene Burdock, FNP   1 tablet at 05/18/19 4765    Lab Results: No results found for this or any previous visit (from the past 48 hour(s)).  Blood Alcohol level:  Lab Results  Component Value Date   ETH <10 05/12/2019   ETH <10 03/26/2019    Metabolic Disorder Labs: No results found for:  HGBA1C, MPG No results found for: PROLACTIN No results found for: CHOL, TRIG, HDL, CHOLHDL, VLDL, LDLCALC  Physical Findings: AIMS: Facial and Oral Movements Muscles of Facial Expression: None, normal Lips and Perioral Area: None, normal Jaw: None, normal Tongue: None, normal,Extremity Movements Upper (arms, wrists, hands, fingers): None, normal Lower (legs, knees, ankles, toes): None, normal, Trunk Movements Neck, shoulders, hips:  None, normal, Overall Severity Severity of abnormal movements (highest score from questions above): None, normal Incapacitation due to abnormal movements: None, normal Patient's awareness of abnormal movements (rate only patient's report): No Awareness, Dental Status Current problems with teeth and/or dentures?: No Does patient usually wear dentures?: No  CIWA:  CIWA-Ar Total: 10 COWS:  COWS Total Score: 3  Musculoskeletal: Strength & Muscle Tone: within normal limits Gait & Station: normal Patient leans: N/A  Psychiatric Specialty Exam: Physical Exam  Nursing note and vitals reviewed. Constitutional: She is oriented to person, place, and time. She appears well-developed and well-nourished.  Cardiovascular: Normal rate.  Respiratory: Effort normal.  Musculoskeletal:        General: Normal range of motion.  Neurological: She is alert and oriented to person, place, and time.  Skin: Skin is warm.    Review of Systems  Constitutional: Negative.   HENT: Negative.   Eyes: Negative.   Respiratory: Negative.   Cardiovascular: Negative.   Gastrointestinal: Negative.   Genitourinary: Negative.   Musculoskeletal: Negative.   Skin: Negative.   Neurological: Negative.   Psychiatric/Behavioral: The patient is nervous/anxious.     Blood pressure 115/81, pulse 89, temperature 97.9 F (36.6 C), temperature source Oral, resp. rate 17, height 5' (1.524 m), weight 39.5 kg, SpO2 100 %, currently breastfeeding.Body mass index is 16.99 kg/m.  General  Appearance: Casual  Eye Contact:  Good  Speech:  Clear and Coherent and Normal Rate  Volume:  Normal  Mood:  Anxious  Affect:  Congruent  Thought Process:  Coherent and Descriptions of Associations: Intact  Orientation:  Full (Time, Place, and Person)  Thought Content:  WDL  Suicidal Thoughts:  No  Homicidal Thoughts:  No  Memory:  Immediate;   Fair Recent;   Fair Remote;   Fair  Judgement:  Fair  Insight:  Fair  Psychomotor Activity:  Normal  Concentration:  Concentration: Fair  Recall:  [employer] of Knowledge:  Fair  Language:  Fair  Akathisia:  No  Handed:  Right  AIMS (if indicated):     Assets:  Communication Skills Desire for Improvement Physical Health  ADL's:  Intact  Cognition:  WNL  Sleep:  Number of Hours: 6.15   Assessment: Patient is seen attending groups and interacting with peers and staff appropriately.  Patient has been seeing outside shooting basketball today.  Patient has been pleasant, calm, cooperative.  She has been compliant with her medications.  She does appear to be a little anxious, but that is justifiable with the concern of up to 8 years in prison due to her drug charges.  It was discussed with Dr. Toni Amend about the potential of the patient discharging to [facility] rescue [facility] today, however, she was unable to get in touch with anyone there and feel that would be a more appropriate discharge for tomorrow morning.  Treatment Plan Summary: Daily contact with patient to assess and evaluate symptoms and progress in treatment and Medication management Continue Bactrim DS 800-160 mg tablet every 12 hours for 2 more days Continue Seroquel 100 mg p.o. nightly for mood stability Continue Vistaril 50 mg p.o. every 6 hours as needed for anxiety Encourage group therapy participation Continue every 15 minute safety checks Potential discharge for tomorrow to [facility] rescue mission  [employer], FNP 05/18/2019, 2:13 PM

## 2019-05-18 NOTE — Plan of Care (Signed)
  Problem: Self-Concept: Goal: Will verbalize positive feelings about self Outcome: Progressing   Problem: Education: Goal: Ability to make informed decisions regarding treatment will improve Outcome: Progressing   Problem: Safety: Goal: Periods of time without injury will increase Outcome: Progressing   Problem: Education: Goal: Emotional status will improve Outcome: Progressing Goal: Mental status will improve Outcome: Progressing Goal: Verbalization of understanding the information provided will improve Outcome: Progressing

## 2019-05-18 NOTE — Plan of Care (Signed)
Patient stated that she easily get agitated for small things when she is anxious about her court date and other issues.Patient denies SI,HI and AVH.Compliant with medications.Appetite and energy level good.Attended groups.Support and encouragement given.

## 2019-05-18 NOTE — Progress Notes (Signed)
Recreation Therapy Notes   Date: 05/18/2019  Time: 9:30 am   Location: Craft room   Behavioral response: N/A   Intervention Topic: Happiness   Discussion/Intervention: Patient did not attend group.   Clinical Observations/Feedback:  Patient did not attend group.   Lindee Leason LRT/CTRS        Rosette Bellavance 05/18/2019 11:32 AM

## 2019-05-18 NOTE — BHH Group Notes (Signed)
Emotional Regulation 05/18/2019 1PM  Type of Therapy/Topic:  Group Therapy:  Emotion Regulation  Participation Level:  Active   Description of Group:   The purpose of this group is to assist patients in learning to regulate negative emotions and experience positive emotions. Patients will be guided to discuss ways in which they have been vulnerable to their negative emotions. These vulnerabilities will be juxtaposed with experiences of positive emotions or situations, and patients will be challenged to use positive emotions to combat negative ones. Special emphasis will be placed on coping with negative emotions in conflict situations, and patients will process healthy conflict resolution skills.  Therapeutic Goals: 1. Patient will identify two positive emotions or experiences to reflect on in order to balance out negative emotions 2. Patient will label two or more emotions that they find the most difficult to experience 3. Patient will demonstrate positive conflict resolution skills through discussion and/or role plays  Summary of Patient Progress: Actively participated in group activity. Patient demonstrated insight as she was able to identify consequences she has received due to displaying negative emotions. Patient discussed with group physical, emotional and behavioral changes that occur when she experiences a great deal of stress. Patient was able to identify healthy coping methods to assist her with properly managing her emotions.      Therapeutic Modalities:   Cognitive Behavioral Therapy Feelings Identification Dialectical Behavioral Therapy   Suzan Slick, LCSW 05/18/2019 3:00 PM

## 2019-05-18 NOTE — Progress Notes (Signed)
Patient pleasant and cooperative. Patient has been social and interacting well with peers on the unit and engaging in group. Patient received prescribed medication and tolerated without incident. Patient had complaint of restless leg and believed to be having withdrawal symptoms. Medication provided to help with symptoms. Patient denies SI/HI/AVH.  Patient remains safe at this time with 15 minute safety check.  Patient informed to contact staff with any questions or concerns.

## 2019-05-19 MED ORDER — SULFAMETHOXAZOLE-TRIMETHOPRIM 800-160 MG PO TABS: 1.0000 | ORAL_TABLET | Freq: Two times a day (BID) | ORAL | 0 refills | Status: AC

## 2019-05-19 MED ORDER — HYDROXYZINE HCL 50 MG PO TABS: 50.0000 mg | ORAL_TABLET | Freq: Three times a day (TID) | ORAL | 0 refills | Status: DC | PRN

## 2019-05-19 MED ORDER — QUETIAPINE FUMARATE 100 MG PO TABS: 100.0000 mg | ORAL_TABLET | Freq: Every day | ORAL | 0 refills | Status: DC

## 2019-05-19 MED ORDER — HYDROXYZINE HCL 50 MG PO TABS: 50.0000 mg | ORAL_TABLET | Freq: Three times a day (TID) | ORAL | 1 refills | Status: DC | PRN

## 2019-05-19 MED ORDER — QUETIAPINE FUMARATE 100 MG PO TABS: 100.0000 mg | ORAL_TABLET | Freq: Every day | ORAL | 1 refills | Status: DC

## 2019-05-19 NOTE — Plan of Care (Signed)
The patient is cooperative and pleasant.  Problem: Education: Goal: Emotional status will improve Outcome: Progressing Goal: Mental status will improve Outcome: Progressing   

## 2019-05-19 NOTE — Progress Notes (Signed)
Recreation Therapy Notes  INPATIENT RECREATION TR PLAN  Patient Details Name: Jill Shepherd MRN: [id number] DOB: 10/05/1995 Today's Date: 05/19/2019  Rec Therapy Plan Is patient appropriate for Therapeutic Recreation?: Yes Treatment times per week: at least 3 Estimated Length of Stay: 5-7 days TR Treatment/Interventions: Group participation (Comment)  Discharge Criteria Pt will be discharged from therapy if:: Discharged Treatment plan/goals/alternatives discussed and agreed upon by:: Patient/family  Discharge Summary Short term goals set: Patient will identify 3 social activities in an alcohol/drug, free-environment within 5 recreation therapy group sessions Short term goals met: Adequate for discharge Progress toward goals comments: Groups attended Which groups?: Self-esteem, Goal setting Reason goals not met: N/A Therapeutic equipment acquired: N/A Reason patient discharged from therapy: Discharge from hospital Pt/family agrees with progress & goals achieved: Yes Date patient discharged from therapy: 05/19/19      05/19/2019, 4:03 PM  

## 2019-05-19 NOTE — Progress Notes (Signed)
Recreation Therapy Notes  Date: 05/19/2019  Time: 9:30 am   Location: Room 21   Behavioral response: N/A   Intervention Topic: Animal Assisted therapy    Discussion/Intervention: Patient did not attend group.   Clinical Observations/Feedback:  Patient did not attend group.   Wynema Garoutte LRT/CTRS        Derya Dettmann 05/19/2019 11:12 AM

## 2019-05-19 NOTE — Progress Notes (Signed)
Pt refused V/S this morning.

## 2019-05-19 NOTE — Progress Notes (Signed)
DISCHARGE SUMMARY:  The patient was discharged home with transportation provided by her mother.  Belongings were returned and the AVS was discussed.  Prescriptions and a supply of medications from the pharmacy was provided.  The patient's mood prior to discharge was stable and the patient displayed evidence of future orientation.

## 2019-05-19 NOTE — BHH Counselor (Signed)
Patient reports her stepmother will pick her up and take her to the [facility] tomorrow(05/20/19). Patient states "Im going to spend today with my family today." CSW reminded pt of the intake hours at the [facility].

## 2019-05-19 NOTE — Plan of Care (Signed)
  Problem: Substance Use/Abuse Goal: STG - Patient will identify 3 social activities in an alcohol/drug, free-environment within 5 recreation therapy group sessions Description: STG - Patient will identify 3 social activities in an alcohol/drug, free-environment within 5 recreation therapy group sessions 05/19/2019 1602 by Alveria Apley, LRT Outcome: Adequate for Discharge 05/19/2019 1602 by Alveria Apley, LRT Outcome: Adequate for Discharge

## 2019-05-19 NOTE — Plan of Care (Signed)
Patient argumentative with staff this evening stating she needed more medication. Patient given education, but was unreceptive to it.   Problem: Education: Goal: Emotional status will improve Outcome: Not Progressing Goal: Mental status will improve Outcome: Not Progressing

## 2019-05-19 NOTE — Progress Notes (Signed)
  [facility] Adult Case Management Discharge Plan :  Will you be returning to the same living situation after discharge:  Yes,  pt lives with relatives At discharge, do you have transportation home?: Yes,  stepmother will pick up Do you have the ability to pay for your medications: No.  Release of information consent forms completed and in the chart;  Patient's signature needed at discharge.  Patient to Follow up at: Follow-up Information    [facility] Follow up.   Why: Please follow up with Lorella Nimrod, Peer Support on 05/23/19 at 7AM via Zoom. Thanks! Contact information: [address] [place] [address] [phone]        [facility]. Go to.   Why: Please go to the walk in clinic Monday -Friday at 830am. Thank you. Please bring photo ID, insurance card, hospital discharge paperwork. Thank you. Contact information:  [address]  [place], [place] [address] [phone] Fax:          Next level of care provider has access to [facility] Link:no  Safety Planning and Suicide Prevention discussed: Yes,  with pt; multiple attempts made to contact pt sister     Has patient been referred to the Quitline?: Patient refused referral  Patient has been referred for addiction treatment: N/A  Suzan Slick, LCSW 05/19/2019, 1:30 PM

## 2019-05-19 NOTE — BHH Suicide Risk Assessment (Signed)
[facility] Discharge Suicide Risk Assessment   Principal Problem: Bipolar 1 disorder [facility]) Discharge Diagnoses: Principal Problem:   Bipolar 1 disorder ([facility]) Active Problems:   Underweight   Methamphetamine abuse ([facility])   Total Time spent with patient: 30 minutes  Musculoskeletal: Strength & Muscle Tone: within normal limits Gait & Station: normal Patient leans: N/A  Psychiatric Specialty Exam: Review of Systems  Constitutional: Negative.   HENT: Negative.   Eyes: Negative.   Respiratory: Negative.   Cardiovascular: Negative.   Gastrointestinal: Negative.   Musculoskeletal: Negative.   Skin: Negative.   Neurological: Negative.   Psychiatric/Behavioral: Negative.     Blood pressure (!) 123/101, pulse 88, temperature 98.3 F (36.8 C), temperature source Oral, resp. rate 18, height 5' (1.524 m), weight 39.5 kg, SpO2 100 %, currently breastfeeding.Body mass index is 16.99 kg/m.  General Appearance: Casual  Eye Contact::  Good  Speech:  Clear and Coherent409  Volume:  Normal  Mood:  Euthymic  Affect:  Constricted  Thought Process:  Goal Directed  Orientation:  Full (Time, Place, and Person)  Thought Content:  Logical  Suicidal Thoughts:  No  Homicidal Thoughts:  No  Memory:  Immediate;   Fair Recent;   Fair Remote;   Fair  Judgement:  Fair  Insight:  Fair  Psychomotor Activity:  Normal  Concentration:  Fair  Recall:  [employer] of Knowledge:Fair  Language: Fair  Akathisia:  No  Handed:  Right  AIMS (if indicated):     Assets:  Desire for Improvement  Sleep:  Number of Hours: 5  Cognition: WNL  ADL's:  Intact   Mental Status Per Nursing Assessment::   On Admission:  Self-harm thoughts  Demographic Factors:  Adolescent or young adult  Loss Factors: Legal issues  Historical Factors: NA  Risk Reduction Factors:   Positive social support  Continued Clinical Symptoms:  Bipolar Disorder:   Mixed State Alcohol/Substance Abuse/Dependencies  Cognitive  Features That Contribute To Risk:  None    Suicide Risk:  Minimal: No identifiable suicidal ideation.  Patients presenting with no risk factors but with morbid ruminations; may be classified as minimal risk based on the severity of the depressive symptoms  Follow-up Information    [facility] Follow up.   Why: Please follow up with Lorella Nimrod, Peer Support on 05/23/19 at 7AM via Zoom. Thanks! Contact information: [address] [place] [address] [phone]        [facility] Follow up.   Contact information:  [address],  [place], [place] [address] [phone] Fax:          Plan Of Care/Follow-up recommendations:  Activity:  Activity as tolerated Diet:  Regular diet Other:  Follow-up with outpatient treatment as recommended  Mordecai Rasmussen, MD 05/19/2019, 12:55 PM

## 2019-05-19 NOTE — Progress Notes (Signed)
Patient was in the day room upon arrival to the unit. Patient was pleasant during assessment denying SI/HI/AVH. Patient was complaining of symptoms of withdrawal. Pt given scheduled and PRN medications, check MAR. Patient given education. Patient became needy as the evening went on, asking if she got all the medications she should have. Patient given support and encouragement. Patient being monitored Q 15 minutes for safety per unit protocol. Patient remains safe on the unit.

## 2019-05-19 NOTE — Discharge Summary (Signed)
Physician Discharge Summary Note  Patient:  Jill Shepherd is an 24 y.o., female MRN:  [id number] DOB:  [birth year] Patient phone:  [phone] (home)  Patient address:   [address] [place] [address],  Total Time spent with patient: 30 minutes  Date of Admission:  05/13/2019 Date of Discharge: 05/19/19  Reason for Admission:  24 year old female with a past psychiatric history significant for polysubstance use disorders who presented to the [facility] emergency department under involuntary commitment by the local mental health center. The patient stated that her stepmother misunderstood her when the patient expressed suicidal ideation. She stated she had been going through a great deal of things, and "if people would listen to me I had be okay"  Principal Problem: Bipolar 1 disorder [facility]) Discharge Diagnoses: Principal Problem:   Bipolar 1 disorder (HCC) Active Problems:   Underweight   Methamphetamine abuse (HCC)   Past Psychiatric History: No previous psychiatric admissions.  She has been treated as an outpatient for substance abuse issues including being treated with Suboxone at [facility] locally.  She reports a history of PTSD and depression as well as anxiety.  Past Medical History:  Past Medical History:  Diagnosis Date  . Drug abuse (HCC)   . Headache   . SAB (spontaneous abortion) 01/19/2014   stillborn at 20.4 wks    Past Surgical History:  Procedure Laterality Date  . INCISION AND DRAINAGE ABSCESS Left 05/07/2019   Procedure: INCISION AND DRAINAGE ABSCESS;  Surgeon: Leafy Ro, MD;  Location: [facility] ORS;  Service: General;  Laterality: Left;   Family History:  Family History  Problem Relation Age of Onset  . Hypertension Father   . Hypertension Paternal Grandfather    Family Psychiatric  History: Her mother is apparently an opiate dependent person and takes Suboxone. Social History:  Social History   Substance and Sexual Activity   Alcohol Use No  . Alcohol/week: 0.0 standard drinks     Social History   Substance and Sexual Activity  Drug Use Yes  . Types: Other-see comments, Amphetamines, Fentanyl, Heroin   Comment: subutex tx    Social History   Socioeconomic History  . Marital status: Legally Separated    Spouse name: Not on file  . Number of children: 1  . Years of education: Not on file  . Highest education level: Not on file  Occupational History    Employer: [employer]  Tobacco Use  . Smoking status: Never Smoker  . Smokeless tobacco: Never Used  Substance and Sexual Activity  . Alcohol use: No    Alcohol/week: 0.0 standard drinks  . Drug use: Yes    Types: Other-see comments, Amphetamines, Fentanyl, Heroin    Comment: subutex tx  . Sexual activity: Yes    Partners: Male    Birth control/protection: None  Other Topics Concern  . Not on file  Social History Narrative  . Not on file   Social Determinants of Health   Financial Resource Strain:   . Difficulty of Paying Living Expenses:   Food Insecurity:   . Worried About Programme researcher, broadcasting/film/video in the Last Year:   . Barista in the Last Year:   Transportation Needs:   . Freight forwarder (Medical):   Marland Kitchen Lack of Transportation (Non-Medical):   Physical Activity:   . Days of Exercise per Week:   . Minutes of Exercise per Session:   Stress:   . Feeling of Stress :  Social Connections:   . Frequency of Communication with Friends and Family:   . Frequency of Social Gatherings with Friends and Family:   . Attends Religious Services:   . Active Member of Clubs or Organizations:   . Attends Banker Meetings:   Marland Kitchen Marital Status:     Hospital Course:  Patient remained on the [facility] unit for 5 days. The patient stabilized on medication and therapy. Patient was discharged on Seroquel 100 mg QHS, Bactrim DS for 3 more doses, and Vistaril 50 mg TID PRN . Patient has shown improvement with improved mood, affect, sleep,  appetite, and interaction. Patient has attended group and participated. Patient has been seen in the day room interacting with peers and staff appropriately. Patient denies any SI/HI/AVH and contracts for safety. Patient agrees to follow up at [facility] and [facility]. Patient is provided with prescriptions for their medications upon discharge.  Physical Findings: AIMS: Facial and Oral Movements Muscles of Facial Expression: None, normal Lips and Perioral Area: None, normal Jaw: None, normal Tongue: None, normal,Extremity Movements Upper (arms, wrists, hands, fingers): None, normal Lower (legs, knees, ankles, toes): None, normal, Trunk Movements Neck, shoulders, hips: None, normal, Overall Severity Severity of abnormal movements (highest score from questions above): None, normal Incapacitation due to abnormal movements: None, normal Patient's awareness of abnormal movements (rate only patient's report): No Awareness, Dental Status Current problems with teeth and/or dentures?: No Does patient usually wear dentures?: No  CIWA:  CIWA-Ar Total: 10 COWS:  COWS Total Score: 0  Musculoskeletal: Strength & Muscle Tone: within normal limits Gait & Station: normal Patient leans: N/A  Psychiatric Specialty Exam: Physical Exam  Nursing note and vitals reviewed. Constitutional: She is oriented to person, place, and time. She appears well-developed and well-nourished.  Cardiovascular: Normal rate.  Respiratory: Effort normal.  Musculoskeletal:        General: Normal range of motion.  Neurological: She is alert and oriented to person, place, and time.  Skin: Skin is warm.    Review of Systems  Constitutional: Negative.   HENT: Negative.   Eyes: Negative.   Respiratory: Negative.   Cardiovascular: Negative.   Gastrointestinal: Negative.   Genitourinary: Negative.   Musculoskeletal: Negative.   Skin: Negative.   Neurological: Negative.   Psychiatric/Behavioral: Negative.     Blood  pressure (!) 123/101, pulse 88, temperature 98.3 F (36.8 C), temperature source Oral, resp. rate 18, height 5' (1.524 m), weight 39.5 kg, SpO2 100 %, currently breastfeeding.Body mass index is 16.99 kg/m.  General Appearance: Casual  Eye Contact:  Good  Speech:  Clear and Coherent and Normal Rate  Volume:  Normal  Mood:  Euthymic  Affect:  Congruent  Thought Process:  Coherent and Descriptions of Associations: Intact  Orientation:  Full (Time, Place, and Person)  Thought Content:  WDL  Suicidal Thoughts:  No  Homicidal Thoughts:  No  Memory:  Immediate;   Fair Recent;   Fair Remote;   Fair  Judgement:  Fair  Insight:  Fair  Psychomotor Activity:  Normal  Concentration:  Concentration: Fair  Recall:  [employer] of Knowledge:  Fair  Language:  Good  Akathisia:  No  Handed:  Right  AIMS (if indicated):     Assets:  Communication Skills Desire for Improvement Housing Physical Health Social Support  ADL's:  Intact  Cognition:  WNL  Sleep:  Number of Hours: 5        Has this patient used any form of tobacco  in the last 30 days? (Cigarettes, Smokeless Tobacco, Cigars, and/or Pipes) Yes, No  Blood Alcohol level:  Lab Results  Component Value Date   ETH <10 05/12/2019   ETH <10 00/93/8182    Metabolic Disorder Labs:  No results found for: HGBA1C, MPG No results found for: PROLACTIN No results found for: CHOL, TRIG, HDL, CHOLHDL, VLDL, LDLCALC  See Psychiatric Specialty Exam and Suicide Risk Assessment completed by Attending Physician prior to discharge.  Discharge destination:  Other:  [facility]  Is patient on multiple antipsychotic therapies at discharge:  No   Has Patient had three or more failed trials of antipsychotic monotherapy by history:  No  Recommended Plan for Multiple Antipsychotic Therapies: NA  Discharge Instructions    Diet - low sodium heart healthy   Complete by: As directed    Increase activity slowly   Complete by: As directed       Allergies as of 05/19/2019   No Known Allergies     Medication List    STOP taking these medications   ibuprofen 400 MG tablet Commonly known as: ADVIL     TAKE these medications     Indication  hydrOXYzine 50 MG tablet Commonly known as: ATARAX/VISTARIL Take 1 tablet (50 mg total) by mouth 3 (three) times daily as needed (PRN). What changed:   medication strength  how much to take  reasons to take this  Indication: Feeling Anxious   QUEtiapine 100 MG tablet Commonly known as: SEROQUEL Take 1 tablet (100 mg total) by mouth at bedtime.  Indication: mood stability   sulfamethoxazole-trimethoprim 800-160 MG tablet Commonly known as: BACTRIM DS Take 1 tablet by mouth every 12 (twelve) hours for 10 days.  Indication: infection      Follow-up Information    [facility] Follow up.   Why: Please follow up with Lanae Boast, Peer Support on 05/23/19 at [place] via [place]. Thanks! Contact information: [place] [address] [phone]           Follow-up recommendations:  Continue activity as tolerated. Continue diet as recommended by your PCP. Ensure to keep all appointments with outpatient providers.  Comments:  Patient is instructed prior to discharge to: Take all medications as prescribed by his/her mental healthcare provider. Report any adverse effects and or reactions from the medicines to his/her outpatient provider promptly. Patient has been instructed & cautioned: To not engage in alcohol and or illegal drug use while on prescription medicines. In the event of worsening symptoms, patient is instructed to call the crisis hotline, 911 and or go to the nearest ED for appropriate evaluation and treatment of symptoms. To follow-up with his/her primary care provider for your other medical issues, concerns and or health care needs.    Signed: Lowry Ram Wanona Stare, FNP 05/19/2019, 10:06 AM

## 2019-05-19 NOTE — Tx Team (Signed)
Interdisciplinary Treatment and Diagnostic Plan Update  05/19/2019 Time of Session: 830 Jill Shepherd MRN: [id number]  Principal Diagnosis: Bipolar 1 disorder (HCC)  Secondary Diagnoses: Principal Problem:   Bipolar 1 disorder (HCC) Active Problems:   Underweight   Methamphetamine abuse (HCC)   Current Medications:  Current Facility-Administered Medications  Medication Dose Route Frequency Provider Last Rate Last Admin  . acetaminophen (TYLENOL) tablet 650 mg  650 mg Oral Q6H PRN Gillermo Murdoch, NP   650 mg at 05/18/19 2105  . alum & mag hydroxide-simeth (MAALOX/MYLANTA) 200-200-20 MG/5ML suspension 30 mL  30 mL Oral Q4H PRN Gillermo Murdoch, NP      . hydrOXYzine (ATARAX/VISTARIL) tablet 50 mg  50 mg Oral Q6H PRN Jearld Lesch, NP   50 mg at 05/18/19 2105  . magnesium hydroxide (MILK OF MAGNESIA) suspension 30 mL  30 mL Oral Daily PRN Gillermo Murdoch, NP      . mupirocin cream (BACTROBAN) 2 %   Topical BID Antonieta Pert, MD   Given at 05/19/19 [phone]  . nicotine (NICODERM CQ - dosed in mg/24 hours) patch 21 mg  21 mg Transdermal Daily Clapacs, Jackquline Denmark, MD   21 mg at 05/18/19 1638  . QUEtiapine (SEROQUEL) tablet 100 mg  100 mg Oral QHS Clapacs, John T, MD   100 mg at 05/18/19 2105  . sulfamethoxazole-trimethoprim (BACTRIM DS) 800-160 MG per tablet 1 tablet  1 tablet Oral Q12H Money, Gerlene Burdock, FNP   1 tablet at 05/19/19 1191   PTA Medications: Medications Prior to Admission  Medication Sig Dispense Refill Last Dose  . hydrOXYzine (ATARAX/VISTARIL) 25 MG tablet Take 1 tablet (25 mg total) by mouth 3 (three) times daily as needed for anxiety. 15 tablet 0   . ibuprofen (ADVIL) 400 MG tablet Take 1 tablet (400 mg total) by mouth every 8 (eight) hours as needed for moderate pain. 30 tablet 0   . [DISCONTINUED] sulfamethoxazole-trimethoprim (BACTRIM DS) 800-160 MG tablet Take 1 tablet by mouth every 12 (twelve) hours for 10 days. 20 tablet 0     Patient Stressors:  Marital or family conflict Substance abuse  Patient Strengths: Motivation for treatment/growth Supportive family/friends  Treatment Modalities: Medication Management, Group therapy, Case management,  1 to 1 session with clinician, Psychoeducation, Recreational therapy.   Physician Treatment Plan for Primary Diagnosis: Bipolar 1 disorder (HCC) Long Term Goal(s): Improvement in symptoms so as ready for discharge Improvement in symptoms so as ready for discharge   Short Term Goals: Ability to identify changes in lifestyle to reduce recurrence of condition will improve Ability to verbalize feelings will improve Ability to disclose and discuss suicidal ideas Ability to demonstrate self-control will improve Ability to identify and develop effective coping behaviors will improve Ability to maintain clinical measurements within normal limits will improve Ability to identify triggers associated with substance abuse/mental health issues will improve Ability to identify changes in lifestyle to reduce recurrence of condition will improve Ability to verbalize feelings will improve Ability to disclose and discuss suicidal ideas Ability to demonstrate self-control will improve Ability to identify and develop effective coping behaviors will improve Ability to maintain clinical measurements within normal limits will improve Ability to identify triggers associated with substance abuse/mental health issues will improve  Medication Management: Evaluate patient's response, side effects, and tolerance of medication regimen.  Therapeutic Interventions: 1 to 1 sessions, Unit Group sessions and Medication administration.  Evaluation of Outcomes: Adequate for Discharge  Physician Treatment Plan for Secondary Diagnosis: Principal Problem:  Bipolar 1 disorder (HCC) Active Problems:   Underweight   Methamphetamine abuse (HCC)  Long Term Goal(s): Improvement in symptoms so as ready for  discharge Improvement in symptoms so as ready for discharge   Short Term Goals: Ability to identify changes in lifestyle to reduce recurrence of condition will improve Ability to verbalize feelings will improve Ability to disclose and discuss suicidal ideas Ability to demonstrate self-control will improve Ability to identify and develop effective coping behaviors will improve Ability to maintain clinical measurements within normal limits will improve Ability to identify triggers associated with substance abuse/mental health issues will improve Ability to identify changes in lifestyle to reduce recurrence of condition will improve Ability to verbalize feelings will improve Ability to disclose and discuss suicidal ideas Ability to demonstrate self-control will improve Ability to identify and develop effective coping behaviors will improve Ability to maintain clinical measurements within normal limits will improve Ability to identify triggers associated with substance abuse/mental health issues will improve     Medication Management: Evaluate patient's response, side effects, and tolerance of medication regimen.  Therapeutic Interventions: 1 to 1 sessions, Unit Group sessions and Medication administration.  Evaluation of Outcomes: Adequate for Discharge   RN Treatment Plan for Primary Diagnosis: Bipolar 1 disorder (HCC) Long Term Goal(s): Knowledge of disease and therapeutic regimen to maintain health will improve  Short Term Goals: Ability to identify and develop effective coping behaviors will improve and Compliance with prescribed medications will improve  Medication Management: RN will administer medications as ordered by provider, will assess and evaluate patient's response and provide education to patient for prescribed medication. RN will report any adverse and/or side effects to prescribing provider.  Therapeutic Interventions: 1 on 1 counseling sessions, Psychoeducation,  Medication administration, Evaluate responses to treatment, Monitor vital signs and CBGs as ordered, Perform/monitor CIWA, COWS, AIMS and Fall Risk screenings as ordered, Perform wound care treatments as ordered.  Evaluation of Outcomes: Adequate for Discharge   LCSW Treatment Plan for Primary Diagnosis: Bipolar 1 disorder (HCC) Long Term Goal(s): Safe transition to appropriate next level of care at discharge, Engage patient in therapeutic group addressing interpersonal concerns.  Short Term Goals: Engage patient in aftercare planning with referrals and resources, Increase social support and Increase skills for wellness and recovery  Therapeutic Interventions: Assess for all discharge needs, 1 to 1 time with Social worker, Explore available resources and support systems, Assess for adequacy in community support network, Educate family and significant other(s) on suicide prevention, Complete Psychosocial Assessment, Interpersonal group therapy.  Evaluation of Outcomes: Adequate for Discharge   Progress in Treatment: Attending groups: No. Participating in groups: No. Taking medication as prescribed: Yes. Toleration medication: Yes. Family/Significant other contact made: Yes, individual(s) contacted:  Paulina Fusi, sister Patient understands diagnosis: Yes. Discussing patient identified problems/goals with staff: Yes. Medical problems stabilized or resolved: Yes. Denies suicidal/homicidal ideation: Yes. Issues/concerns per patient self-inventory: No. Other: none  New problem(s) identified: No, Describe:  none  New Short Term/Long Term Goal(s):  Patient Goals:  "get clean, get help, avoid withdrawal"  Discharge Plan or Barriers: Update 05/19/19: Pt is scheduled for discharge and will be referred to the [facility].  Reason for Continuation of Hospitalization: D/C 05/19/19  Estimated Length of Stay: N/A.  Attendees: Patient:  05/14/2019   Physician: Mordecai Rasmussen  05/14/2019   Nursing: Torrie Mayers 05/14/2019   RN Care Manager: 05/14/2019   Social Worker: Lowella Dandy Penni Homans 05/14/2019   Recreational Therapist:  05/14/2019   Other:  05/14/2019  Other:  05/14/2019   Other: 05/14/2019        Scribe for Treatment Team: Yvette Rack, LCSW 05/19/2019 11:56 AM

## 2019-06-05 ENCOUNTER — Other Ambulatory Visit: Payer: Self-pay

## 2019-06-05 ENCOUNTER — Emergency Department
Admission: EM | Admit: 2019-06-05 | Discharge: 2019-06-05 | Disposition: A | Discharge status: Home / Self Care | Payer: Self-pay | Attending: Emergency Medicine | Admitting: Emergency Medicine

## 2019-06-05 DIAGNOSIS — Z79899 Other long term (current) drug therapy: Secondary | ICD-10-CM | POA: Insufficient documentation

## 2019-06-05 DIAGNOSIS — Z760 Encounter for issue of repeat prescription: Secondary | ICD-10-CM | POA: Insufficient documentation

## 2019-06-05 MED ORDER — QUETIAPINE FUMARATE 25 MG PO TABS
100.0000 mg | ORAL_TABLET | Freq: Every day | ORAL | Status: DC
  Administered 2019-06-05: 23:00:00 100 mg via ORAL
  Filled 2019-06-05: qty 4

## 2019-06-05 MED ORDER — QUETIAPINE FUMARATE 100 MG PO TABS: 100.0000 mg | ORAL_TABLET | Freq: Every day | ORAL | 0 refills | Status: DC

## 2019-06-05 MED ORDER — QUETIAPINE FUMARATE 25 MG PO TABS: 25.0000 mg | ORAL_TABLET | Freq: Every day | ORAL | Status: DC

## 2019-06-05 MED ORDER — QUETIAPINE FUMARATE 25 MG PO TABS: 100.0000 mg | ORAL_TABLET | Freq: Every day | ORAL | Status: DC

## 2019-06-05 NOTE — ED Triage Notes (Addendum)
PT to ED via [facility]. PT states she is here because she is on drugs and needs refill of her medicine. PT is out of her seraquel. PT denies any further complaints. PT tearful.

## 2019-06-05 NOTE — Discharge Instructions (Addendum)
Please follow-up with [facility] by calling the number provided to arrange a follow-up appointment.  Please restart your medication as prescribed.  Please abstain from substance use.

## 2019-06-05 NOTE — ED Provider Notes (Signed)
Jill Shepherd Provider Note  Time seen: 9:58 PM  I have reviewed the triage vital signs and the nursing notes.   HISTORY  Chief Complaint Substance abuse.  Medication refill.   HPI Jill Shepherd is a 24 y.o. female with a past medical history of substance abuse presents to the emergency Shepherd for medication refill.  According to the patient she started using drugs again yesterday.  States she is going to stay with a friend of hers who does not use any drugs who is going to help her get clean per patient.  However he has requested that she get back on her psychiatric medications.  Patient recently ran out of her Seroquel.  States she is also out of Suboxone but told the patient she would need to follow-up with her psychiatrist at [facility] regarding that.  Understands.  Patient has no medical complaints besides she states she is late for her menstrual cycle.  No abdominal pain.  Patient states she does not want to go to detox tonight.  Denies any substance use today.   Past Medical History:  Diagnosis Date  . Drug abuse (HCC)   . Headache   . SAB (spontaneous abortion) 01/19/2014   stillborn at 20.4 wks    Patient Active Problem List   Diagnosis Date Noted  . Bipolar 1 disorder (HCC) 05/13/2019  . Opiate abuse, continuous (HCC) 05/07/2019  . 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 Sepsis (HCC) 05/06/2019  . Abscess-multpile sites 05/06/2019  . Anxiety 08/04/2017  . Postpartum depression 08/04/2017  . Labor and delivery indication for care or intervention 07/17/2017  . SVD (spontaneous vaginal delivery) 07/17/2017  . Pregnancy 07/07/2017  . History of preterm delivery 11/28/2016  . Pregnancy complicated by subutex maintenance, antepartum (HCC) 11/28/2016  . History of preterm premature rupture of membranes (PPROM) 09/25/2015  . History of pregnancy loss in prior pregnancy, currently pregnant 05/14/2015  . History of drug use 05/14/2015   . Underweight 05/14/2015    Past Surgical History:  Procedure Laterality Date  . INCISION AND DRAINAGE ABSCESS Left 05/07/2019   Procedure: INCISION AND DRAINAGE ABSCESS;  Surgeon: Leafy Ro, MD;  Location: [facility] ORS;  Service: General;  Laterality: Left;    Prior to Admission medications   Medication Sig Start Date End Date Taking? Authorizing Provider  hydrOXYzine (ATARAX/VISTARIL) 50 MG tablet Take 1 tablet (50 mg total) by mouth 3 (three) times daily as needed (PRN). 05/19/19   Money, Gerlene Burdock, FNP  QUEtiapine (SEROQUEL) 100 MG tablet Take 1 tablet (100 mg total) by mouth at bedtime. 05/19/19   Money, Gerlene Burdock, FNP    No Known Allergies  Family History  Problem Relation Age of Onset  . Hypertension Father   . Hypertension Paternal Grandfather     Social History Social History   Tobacco Use  . Smoking status: Never Smoker  . Smokeless tobacco: Never Used  Substance Use Topics  . Alcohol use: No    Alcohol/week: 0.0 standard drinks  . Drug use: Yes    Types: Other-see comments, Amphetamines, Fentanyl, Heroin    Comment: subutex tx, snorted heroin last on 5/8    Review of Systems Constitutional: Negative for fever. Cardiovascular: Negative for chest pain. Respiratory: Negative for shortness of breath. Gastrointestinal: Negative for abdominal pain Musculoskeletal: Negative for musculoskeletal complaints Neurological: Negative for headache All other ROS negative  ____________________________________________   PHYSICAL EXAM:  VITAL SIGNS: ED Triage Vitals  Enc Vitals Group  BP 06/05/19 2130 131/81     Pulse Rate 06/05/19 2130 (!) 104     Resp 06/05/19 2130 20     Temp 06/05/19 2146 97.7 F (36.5 C)     Temp Source 06/05/19 2146 Oral     SpO2 06/05/19 2130 100 %     Weight 06/05/19 2133 90 lb (40.8 kg)     Height 06/05/19 2133 5' (1.524 m)     Head Circumference --      Peak Flow --      Pain Score 06/05/19 2133 0     Pain Loc --      Pain Edu? --       Excl. in [place]? --    Constitutional: Alert and oriented.  No acute distress. Eyes: Normal exam ENT      Head: Normocephalic and atraumatic.      Mouth/Throat: Mucous membranes are moist. Cardiovascular: Normal rate, regular rhythm.  Respiratory: Normal respiratory effort without tachypnea nor retractions. Breath sounds are clear Gastrointestinal: Soft and nontender. No distention.  Musculoskeletal: Nontender with normal range of motion in all extremities. Neurologic:  Normal speech and language. No gross focal neurologic deficits  Skin:  Skin is warm, dry and intact.  Psychiatric: Mood and affect are normal.   ____________________________________________  INITIAL IMPRESSION / ASSESSMENT AND PLAN / ED COURSE  Pertinent labs & imaging results that were available during my care of the patient were reviewed by me and considered in my medical decision making (see chart for details).   Patient presents to the emergency Shepherd for medication refill.  Request medication of her Suboxone and Seroquel.  I will refill the patient Seroquel but I told her she needs to follow-up with her psychiatrist regarding her Suboxone.  Patient agreeable plan of care.  Patient does not want to go to detox tonight.  Patient did state during my review of systems questioning that it has been "a while" since her last menstrual cycle.  We will check a pregnancy test as a precaution.  I will refill the patient Seroquel and have the patient follow-up with [facility].  Patient agreeable to plan of care.  Jill Shepherd was evaluated in Emergency Shepherd on 06/05/2019 for the symptoms described in the history of present illness. She was evaluated in the context of the global COVID-19 pandemic, which necessitated consideration that the patient might be at risk for infection with the SARS-CoV-2 virus that causes COVID-19. Institutional protocols and algorithms that pertain to the evaluation of patients at risk for COVID-19 are  in a state of rapid change based on information released by regulatory bodies including the CDC and federal and state organizations. These policies and algorithms were followed during the patient's care in the ED.  ____________________________________________   FINAL CLINICAL IMPRESSION(S) / ED DIAGNOSES  Medication refill   Harvest Dark, MD 06/05/19 2201

## 2019-06-05 NOTE — ED Notes (Signed)
Pt. Transferred from Triage to room after dressing out and screening for contraband. Report to include Situation, Background, Assessment and Recommendations from [facility]. Pt. Oriented to [place] including Q15 minute rounds as well as Psychologist, counselling for their protection. Patient is alert and oriented, warm and dry in no acute distress. Patient denies SI, HI, and AVH. Pt. Encouraged to let me know if needs arise.

## 2019-06-06 ENCOUNTER — Emergency Department
Admission: EM | Admit: 2019-06-06 | Discharge: 2019-06-06 | Disposition: A | Discharge status: Home / Self Care | Payer: Self-pay | Attending: Emergency Medicine | Admitting: Emergency Medicine

## 2019-06-06 ENCOUNTER — Encounter: Payer: Self-pay | Admitting: Emergency Medicine

## 2019-06-06 DIAGNOSIS — F1910 Other psychoactive substance abuse, uncomplicated: Secondary | ICD-10-CM | POA: Insufficient documentation

## 2019-06-06 LAB — POCT PREGNANCY, URINE: Preg Test, Ur: NEGATIVE

## 2019-06-06 NOTE — ED Provider Notes (Signed)
[facility] Emergency Department Provider Note   ____________________________________________    I have reviewed the triage vital signs and the nursing notes.   HISTORY  Chief Complaint Medication refill    HPI Jill Shepherd is a 24 y.o. female who who was seen and discharged last night who re-presents this morning asking for medication dispensing.  Patient reports she does not want a prescription she thinks that she should have the medication given to her.  In brief she reports he started using heroin and methamphetamines recently again and she is interested in detox.  No SI or HI  Past Medical History:  Diagnosis Date  . Drug abuse ([place])   . Headache   . SAB (spontaneous abortion) 01/19/2014   stillborn at 20.4 wks    Patient Active Problem List   Diagnosis Date Noted  . Bipolar 1 disorder ([place]) 05/13/2019  . Opiate abuse, continuous ([place]) 05/07/2019  . 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 Sepsis ([place]) 05/06/2019  . Abscess-multpile sites 05/06/2019  . Anxiety 08/04/2017  . Postpartum depression 08/04/2017  . Labor and delivery indication for care or intervention 07/17/2017  . SVD (spontaneous vaginal delivery) 07/17/2017  . Pregnancy 07/07/2017  . History of preterm delivery 11/28/2016  . Pregnancy complicated by subutex maintenance, antepartum ([place]) 11/28/2016  . History of preterm premature rupture of membranes (PPROM) 09/25/2015  . History of pregnancy loss in prior pregnancy, currently pregnant 05/14/2015  . History of drug use 05/14/2015  . Underweight 05/14/2015    Past Surgical History:  Procedure Laterality Date  . INCISION AND DRAINAGE ABSCESS Left 05/07/2019   Procedure: INCISION AND DRAINAGE ABSCESS;  Surgeon: Jules Husbands, MD;  Location: [facility] ORS;  Service: General;  Laterality: Left;    Prior to Admission medications   Medication Sig Start Date End Date Taking? Authorizing Provider  hydrOXYzine  (ATARAX/VISTARIL) 50 MG tablet Take 1 tablet (50 mg total) by mouth 3 (three) times daily as needed (PRN). 05/19/19   Money, Lowry Ram, FNP  QUEtiapine (SEROQUEL) 100 MG tablet Take 1 tablet (100 mg total) by mouth at bedtime. 06/05/19   Harvest Dark, MD     Allergies Patient has no known allergies.  Family History  Problem Relation Age of Onset  . Hypertension Father   . Hypertension Paternal Grandfather     Social History Social History   Tobacco Use  . Smoking status: Never Smoker  . Smokeless tobacco: Never Used  Substance Use Topics  . Alcohol use: No    Alcohol/week: 0.0 standard drinks  . Drug use: Yes    Types: Other-see comments, Amphetamines, Fentanyl, Heroin    Comment: subutex tx, snorted heroin last on 5/8    Review of Systems  Constitutional: No fever/chills Eyes: No visual changes.  ENT: No sore throat. Cardiovascular: Denies chest pain. Respiratory: Denies shortness of breath. Gastrointestinal: Mild nausea Genitourinary: Negative for dysuria. Musculoskeletal: Negative for back pain. Skin: Negative for rash. Neurological: Negative for headaches or weakness   ____________________________________________   PHYSICAL EXAM:  VITAL SIGNS: ED Triage Vitals  Enc Vitals Group     BP 06/06/19 1003 101/61     Pulse Rate 06/06/19 1003 94     Resp 06/06/19 1003 17     Temp 06/06/19 1003 98.3 F (36.8 C)     Temp Source 06/06/19 1003 Oral     SpO2 06/06/19 1003 100 %     Weight 06/06/19 0949 40.8 kg (90 lb)  Height 06/06/19 0949 1.524 m (5')     Head Circumference --      Peak Flow --      Pain Score 06/06/19 0949 0     Pain Loc --      Pain Edu? --      Excl. in [place]? --     Constitutional: Alert and oriented. No acute distress.   Mouth/Throat: Mucous membranes are moist.   Neck:  Painless ROM Cardiovascular: Normal rate, regular rhythm. Peri [place] peripheral circulation. Respiratory: Normal respiratory effort.  No retractions.    Gastrointestinal: Soft and nontender. No distention. .  Musculoskeletal: No lower extremity tenderness nor edema.  Warm and well perfused Neurologic:  Normal speech and language. No gross focal neurologic deficits are appreciated.  Skin:  Skin is warm, dry and intact. No rash noted. Psychiatric: Mood and affect are normal. Speech and behavior are normal.  ____________________________________________   LABS (all labs ordered are listed, but only abnormal results are displayed)  Labs Reviewed - No data to display ____________________________________________  EKG   ____________________________________________  RADIOLOGY   ____________________________________________   PROCEDURES  Procedure(s) performed: No  Procedures   Critical Care performed: No ____________________________________________   INITIAL IMPRESSION / ASSESSMENT AND PLAN / ED COURSE  Pertinent labs & imaging results that were available during my care of the patient were reviewed by me and considered in my medical decision making (see chart for details).  Patient overall well-appearing and in no acute distress.  No psychiatric emergency, no SI or HI.  Exam is reassuring, vital signs unremarkable.  TTS consulted and provided detox resources.  Informed patient that we do not provide medications, she will have to obtain these from the pharmacy.    ____________________________________________   FINAL CLINICAL IMPRESSION(S) / ED DIAGNOSES  Final diagnoses:  Substance abuse (HCC)        Note:  This document was prepared using Dragon voice recognition software and may include unintentional dictation errors.   Jene Every, MD 06/06/19 1316

## 2019-06-06 NOTE — ED Notes (Signed)
This RN to bedside, initially attempting to D/C patient, pt states, "No I'm trying to commit myself". EDP to bedside to speak with patient regarding plan of D/C as previously discussed by EDP and patient. Pt D/C with D/C instructions, prescriptions from yesterday, and resources given to her by TTS. VSS at time of D/C. Pt ambulatory to the lobby without difficulty.

## 2019-06-06 NOTE — ED Triage Notes (Signed)
Pt reports was seen here last night due to her drug habit and being out of her Seroquel and anxiety medication. Pt reports was discharged with a prescription. Pt reports waited in the [place] all night for a ride and this am called family and they told her to check back in and voluntarily. Pt states that she needs the actual pills and not a prescription. Pt reports uses Meth and Heroin and last used 2 days ago. Pt reports also needs pills for anxiety and not a prescription.

## 2019-09-14 ENCOUNTER — Telehealth: Payer: Self-pay | Admitting: Pharmacist

## 2019-09-14 NOTE — Telephone Encounter (Signed)
Patient failed to provide requested 2021 financial documentation. No additional medication assistance will be provided by [facility] without the required proof of income documentation. Patient notified by letter Debra Cheek Administrative Assistant [facility] 

## 2019-11-08 ENCOUNTER — Emergency Department
Admission: EM | Admit: 2019-11-08 | Discharge: 2019-11-09 | Disposition: A | Discharge status: Home / Self Care | Payer: Self-pay | Attending: Emergency Medicine | Admitting: Emergency Medicine

## 2019-11-08 DIAGNOSIS — R4182 Altered mental status, unspecified: Secondary | ICD-10-CM | POA: Insufficient documentation

## 2019-11-08 DIAGNOSIS — Z5321 Procedure and treatment not carried out due to patient leaving prior to being seen by health care provider: Secondary | ICD-10-CM | POA: Insufficient documentation

## 2019-11-08 DIAGNOSIS — F1910 Other psychoactive substance abuse, uncomplicated: Secondary | ICD-10-CM | POA: Insufficient documentation

## 2019-11-08 NOTE — ED Notes (Signed)
Pt wandering around the lobby carrying her sandwich tray and drink.  Pt was asked to return to her wheelchair because her bracelet and purse is there.  She did not recall where her wheelchair is.  Pt just walking in circles.  Assisted her back to her chair, placed her bracelet into her bag and parked her near nurses station.

## 2019-11-08 NOTE — ED Triage Notes (Signed)
Pt arrived via EMS from someone's front yard where she sts, she was doing Yoga. Pt poor historian but is able to admit to methamphetamine usage. Pt has 4 needles and 2 saline packs that she gave to EMS out of her pocket book. Pt understand that this will be properly disposed of. Pt reports that she does not want to be here but she is hungry. Pt provided with [place] sandwich and taken to lobby.

## 2019-11-09 NOTE — ED Notes (Signed)
Pt seen getting in passenger side of vehicle and leaving ED parking lot

## 2019-12-01 ENCOUNTER — Emergency Department
Admission: EM | Admit: 2019-12-01 | Discharge: 2019-12-01 | Disposition: A | Discharge status: Home / Self Care | Payer: Self-pay | Attending: Emergency Medicine | Admitting: Emergency Medicine

## 2019-12-01 ENCOUNTER — Encounter: Payer: Self-pay | Admitting: Emergency Medicine

## 2019-12-01 ENCOUNTER — Other Ambulatory Visit: Payer: Self-pay

## 2019-12-01 DIAGNOSIS — R410 Disorientation, unspecified: Secondary | ICD-10-CM | POA: Insufficient documentation

## 2019-12-01 DIAGNOSIS — Z5321 Procedure and treatment not carried out due to patient leaving prior to being seen by health care provider: Secondary | ICD-10-CM | POA: Insufficient documentation

## 2019-12-01 LAB — COMPREHENSIVE METABOLIC PANEL
ALT: 24 U/L (ref 0–44)
AST: 23 U/L (ref 15–41)
Albumin: 4.1 g/dL (ref 3.5–5.0)
Alkaline Phosphatase: 69 U/L (ref 38–126)
Anion gap: 9 (ref 5–15)
BUN: 8 mg/dL (ref 6–20)
CO2: 28 mmol/L (ref 22–32)
Calcium: 9.5 mg/dL (ref 8.9–10.3)
Chloride: 102 mmol/L (ref 98–111)
Creatinine, Ser: 0.58 mg/dL (ref 0.44–1.00)
GFR, Estimated: >60 mL/min (ref >60)
Glucose, Bld: 109 mg/dL — ABNORMAL HIGH (ref 70–99)
Potassium: 3.6 mmol/L (ref 3.5–5.1)
Sodium: 139 mmol/L (ref 135–145)
Total Bilirubin: 0.6 mg/dL (ref 0.3–1.2)
Total Protein: 7.4 g/dL (ref 6.5–8.1)

## 2019-12-01 LAB — CBC WITH DIFFERENTIAL/PLATELET
Abs Immature Granulocytes: 0.02 10*3/uL (ref 0.00–0.07)
Basophils Absolute: 0 10*3/uL (ref 0.0–0.1)
Basophils Relative: 0 %
Eosinophils Absolute: 0.1 10*3/uL (ref 0.0–0.5)
Eosinophils Relative: 1 %
HCT: 39.6 % (ref 36.0–46.0)
Hemoglobin: 13.5 g/dL (ref 12.0–15.0)
Immature Granulocytes: 0 %
Lymphocytes Relative: 38 %
Lymphs Abs: 3.1 10*3/uL (ref 0.7–4.0)
MCH: 29.7 pg (ref 26.0–34.0)
MCHC: 34.1 g/dL (ref 30.0–36.0)
MCV: 87 fL (ref 80.0–100.0)
Monocytes Absolute: 0.5 10*3/uL (ref 0.1–1.0)
Monocytes Relative: 6 %
Neutro Abs: 4.5 10*3/uL (ref 1.7–7.7)
Neutrophils Relative %: 55 %
Platelets: 297 10*3/uL (ref 150–400)
RBC: 4.55 MIL/uL (ref 3.87–5.11)
RDW: 13.6 % (ref 11.5–15.5)
WBC: 8.3 10*3/uL (ref 4.0–10.5)
nRBC: 0 % (ref 0.0–0.2)

## 2019-12-01 LAB — URINALYSIS, COMPLETE (UACMP) WITH MICROSCOPIC
Bilirubin Urine: NEGATIVE
Glucose, UA: NEGATIVE mg/dL
Hgb urine dipstick: NEGATIVE
Ketones, ur: NEGATIVE mg/dL
Nitrite: NEGATIVE
Protein, ur: NEGATIVE mg/dL
Specific Gravity, Urine: 1.015 (ref 1.005–1.030)
WBC, UA: >50 WBC/hpf — ABNORMAL HIGH (ref 0–5)
pH: 7 (ref 5.0–8.0)

## 2019-12-01 LAB — URINE DRUG SCREEN, QUALITATIVE (ARMC ONLY)
Amphetamines, Ur Screen: POSITIVE — AB
Barbiturates, Ur Screen: NOT DETECTED
Benzodiazepine, Ur Scrn: NOT DETECTED
Cannabinoid 50 Ng, Ur ~~LOC~~: NOT DETECTED
Cocaine Metabolite,Ur ~~LOC~~: NOT DETECTED
MDMA (Ecstasy)Ur Screen: NOT DETECTED
Methadone Scn, Ur: NOT DETECTED
Opiate, Ur Screen: NOT DETECTED
Phencyclidine (PCP) Ur S: NOT DETECTED
Tricyclic, Ur Screen: NOT DETECTED

## 2019-12-01 LAB — TROPONIN I (HIGH SENSITIVITY): Troponin I (High Sensitivity): 4 ng/L (ref <18)

## 2019-12-01 LAB — SALICYLATE LEVEL: Salicylate Lvl: <7 mg/dL — ABNORMAL LOW (ref 7.0–30.0)

## 2019-12-01 LAB — ETHANOL: Alcohol, Ethyl (B): <10 mg/dL (ref <10)

## 2019-12-01 LAB — POC URINE PREG, ED: Preg Test, Ur: NEGATIVE

## 2019-12-01 LAB — ACETAMINOPHEN LEVEL: Acetaminophen (Tylenol), Serum: <10 ug/mL — ABNORMAL LOW (ref 10–30)

## 2019-12-01 NOTE — ED Triage Notes (Addendum)
Patient ambulatory to triage with steady gait, without difficulty or distress noted; pt reports that she is here for "confusion"; pt is very irritable and does not want to answer any questions; denies SI or HI; pt was brought in by [employer]; pt st she called 911 because she "woke up and was locked in a trailer"; st has no memory of events; pt denies any c/o other than "confusion and asked officer to bring her in so she could get some rest"; pt also st "I did a fireball but I put it in my mouth and then I spit it out"

## 2019-12-06 ENCOUNTER — Other Ambulatory Visit: Payer: Self-pay

## 2019-12-06 ENCOUNTER — Encounter: Payer: Self-pay | Admitting: Emergency Medicine

## 2019-12-06 ENCOUNTER — Emergency Department
Admission: EM | Admit: 2019-12-06 | Discharge: 2019-12-07 | Disposition: A | Discharge status: Home / Self Care | Payer: No Typology Code available for payment source | Attending: Student in an Organized Health Care Education/Training Program | Admitting: Student in an Organized Health Care Education/Training Program

## 2019-12-06 DIAGNOSIS — F15951 Other stimulant use, unspecified with stimulant-induced psychotic disorder with hallucinations: Secondary | ICD-10-CM

## 2019-12-06 DIAGNOSIS — Z20822 Contact with and (suspected) exposure to covid-19: Secondary | ICD-10-CM | POA: Insufficient documentation

## 2019-12-06 DIAGNOSIS — F1910 Other psychoactive substance abuse, uncomplicated: Secondary | ICD-10-CM | POA: Insufficient documentation

## 2019-12-06 DIAGNOSIS — F1510 Other stimulant abuse, uncomplicated: Secondary | ICD-10-CM | POA: Diagnosis present

## 2019-12-06 DIAGNOSIS — F319 Bipolar disorder, unspecified: Secondary | ICD-10-CM | POA: Diagnosis present

## 2019-12-06 LAB — COMPREHENSIVE METABOLIC PANEL
ALT: 28 U/L (ref 0–44)
AST: 34 U/L (ref 15–41)
Albumin: 4.6 g/dL (ref 3.5–5.0)
Alkaline Phosphatase: 70 U/L (ref 38–126)
Anion gap: 12 (ref 5–15)
BUN: 17 mg/dL (ref 6–20)
CO2: 27 mmol/L (ref 22–32)
Calcium: 9.9 mg/dL (ref 8.9–10.3)
Chloride: 101 mmol/L (ref 98–111)
Creatinine, Ser: 0.94 mg/dL (ref 0.44–1.00)
GFR, Estimated: >60 mL/min (ref >60)
Glucose, Bld: 92 mg/dL (ref 70–99)
Potassium: 3.8 mmol/L (ref 3.5–5.1)
Sodium: 140 mmol/L (ref 135–145)
Total Bilirubin: 0.7 mg/dL (ref 0.3–1.2)
Total Protein: 8 g/dL (ref 6.5–8.1)

## 2019-12-06 LAB — URINE DRUG SCREEN, QUALITATIVE (ARMC ONLY)
Amphetamines, Ur Screen: POSITIVE — AB
Barbiturates, Ur Screen: NOT DETECTED
Benzodiazepine, Ur Scrn: NOT DETECTED
Cannabinoid 50 Ng, Ur ~~LOC~~: POSITIVE — AB
Cocaine Metabolite,Ur ~~LOC~~: NOT DETECTED
MDMA (Ecstasy)Ur Screen: NOT DETECTED
Methadone Scn, Ur: NOT DETECTED
Opiate, Ur Screen: NOT DETECTED
Phencyclidine (PCP) Ur S: NOT DETECTED
Tricyclic, Ur Screen: NOT DETECTED

## 2019-12-06 LAB — RESPIRATORY PANEL BY RT PCR (FLU A&B, COVID)
Influenza A by PCR: NEGATIVE
Influenza B by PCR: NEGATIVE
SARS Coronavirus 2 by RT PCR: NEGATIVE

## 2019-12-06 LAB — CBC
HCT: 40.3 % (ref 36.0–46.0)
Hemoglobin: 13.9 g/dL (ref 12.0–15.0)
MCH: 29.6 pg (ref 26.0–34.0)
MCHC: 34.5 g/dL (ref 30.0–36.0)
MCV: 85.9 fL (ref 80.0–100.0)
Platelets: 330 10*3/uL (ref 150–400)
RBC: 4.69 MIL/uL (ref 3.87–5.11)
RDW: 13.5 % (ref 11.5–15.5)
WBC: 8 10*3/uL (ref 4.0–10.5)
nRBC: 0 % (ref 0.0–0.2)

## 2019-12-06 LAB — ETHANOL: Alcohol, Ethyl (B): <10 mg/dL (ref <10)

## 2019-12-06 LAB — ACETAMINOPHEN LEVEL: Acetaminophen (Tylenol), Serum: <10 ug/mL — ABNORMAL LOW (ref 10–30)

## 2019-12-06 LAB — SALICYLATE LEVEL: Salicylate Lvl: <7 mg/dL — ABNORMAL LOW (ref 7.0–30.0)

## 2019-12-06 MED ORDER — QUETIAPINE FUMARATE 100 MG PO TABS: 100.0000 mg | ORAL_TABLET | Freq: Every day | ORAL | 1 refills | Status: DC

## 2019-12-06 MED ORDER — QUETIAPINE FUMARATE 25 MG PO TABS
100.0000 mg | ORAL_TABLET | Freq: Every day | ORAL | Status: DC
  Administered 2019-12-06: 100 mg via ORAL
  Filled 2019-12-06: qty 4

## 2019-12-06 MED ORDER — LORAZEPAM 1 MG PO TABS
1.0000 mg | ORAL_TABLET | Freq: Once | ORAL | Status: AC
  Administered 2019-12-06: 1 mg via ORAL
  Filled 2019-12-06: qty 1

## 2019-12-06 NOTE — ED Notes (Signed)
Pt given meal tray.

## 2019-12-06 NOTE — ED Provider Notes (Signed)
----------------------------------------- °  11:00 PM on 12/06/2019 -----------------------------------------  Patient had been cleared by psychiatry and IVC rescinded due to patient's psychosis related to substance abuse, patient not felt to be an acute threat to herself or others.  Plan was for her to be discharged once clinically sober.  Patient's mother called the ED, very concerned that plan is for her to be discharged.  She is concerned that the patient will kill herself if she were to go home.  Will observe patient in the ED for now and have psychiatry reassess in the morning.   Chesley Noon, MD 12/06/19 2302

## 2019-12-06 NOTE — ED Triage Notes (Signed)
Pt changed into hospital clothing.  Pt belongings include: Jill Shepherd and white long sleeved shirt Jill Shepherd and black sweat pants Purple flip flops with black straps    Pt with recent use of meth.

## 2019-12-06 NOTE — ED Notes (Signed)
Pt mother updated by EDP

## 2019-12-06 NOTE — ED Notes (Signed)
INVOLUNTARY/all papers on chart awaiting TTS/PSYCH consult

## 2019-12-06 NOTE — ED Notes (Signed)
Pt pacing, expresses desire to leave. Pt calling family listed in chart, with no response. Pt given ascom by another RN, pt dialing random numbers and pressing the security hotdial key. Pt visibly anxious, states "I just want to go home. I'll walk there if I have to. You all are just going to sit here and watch me die."  EDP notified, spoke with pt directly. Pt expresses desire for "something to calm me down." Pt agreeable to PO ativan at this time. EDP tells pt he believes it would be safest and in the best interest of her health for her to stay here until she has come down off the meth a bit more, calm and stable enough to go home. Pt agrees to return to bed and stay, given an anxiolytic

## 2019-12-06 NOTE — ED Notes (Signed)
Pt moved out of hallway per incoming pt. Pt informed and agreeable at this time. Pt initially restless after relocation, given sprite per request and returned to bed at this time

## 2019-12-06 NOTE — ED Provider Notes (Signed)
[facility] Emergency Department Provider Note    First MD Initiated Contact with Patient 12/06/19 1350     (approximate)  I have reviewed the triage vital signs and the nursing notes.   HISTORY  Chief Complaint Mental Health Problem    HPI Jill Shepherd is a 24 y.o. female the history of bipolar disorder as well as polysubstance abuse presents to the ER for evaluation by police department.  Patient under IVC reportedly agitated none IVC paperwork documents the patient was reportedly is stamped in a driveway naked.  Was uncooperative with police.  Failure states the patient has been using meth for the past 24 hours.PAtient appears disheveled.  Appears still under the influence.    Past Medical History:  Diagnosis Date  . Drug abuse (HCC)   . Headache   . SAB (spontaneous abortion) 01/19/2014   stillborn at 20.4 wks   Family History  Problem Relation Age of Onset  . Hypertension Father   . Hypertension Paternal Grandfather    Past Surgical History:  Procedure Laterality Date  . INCISION AND DRAINAGE ABSCESS Left 05/07/2019   Procedure: INCISION AND DRAINAGE ABSCESS;  Surgeon: Leafy Ro, MD;  Location: [facility] ORS;  Service: General;  Laterality: Left;   Patient Active Problem List   Diagnosis Date Noted  . Bipolar 1 disorder (HCC) 05/13/2019  . Opiate abuse, continuous (HCC) 05/07/2019  . 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 Sepsis (HCC) 05/06/2019  . Abscess-multpile sites 05/06/2019  . Anxiety 08/04/2017  . Postpartum depression 08/04/2017  . Labor and delivery indication for care or intervention 07/17/2017  . SVD (spontaneous vaginal delivery) 07/17/2017  . Pregnancy 07/07/2017  . History of preterm delivery 11/28/2016  . Pregnancy complicated by subutex maintenance, antepartum (HCC) 11/28/2016  . History of preterm premature rupture of membranes (PPROM) 09/25/2015  . History of pregnancy loss in prior pregnancy, currently  pregnant 05/14/2015  . History of drug use 05/14/2015  . Underweight 05/14/2015      Prior to Admission medications   Medication Sig Start Date End Date Taking? Authorizing Provider  hydrOXYzine (ATARAX/VISTARIL) 50 MG tablet Take 1 tablet (50 mg total) by mouth 3 (three) times daily as needed (PRN). 05/19/19   Money, Gerlene Burdock, FNP  QUEtiapine (SEROQUEL) 100 MG tablet Take 1 tablet (100 mg total) by mouth at bedtime. 06/05/19   Minna Antis, MD    Allergies Patient has no known allergies.    Social History Social History   Tobacco Use  . Smoking status: Never Smoker  . Smokeless tobacco: Never Used  Vaping Use  . Vaping Use: Never used  Substance Use Topics  . Alcohol use: No    Alcohol/week: 0.0 standard drinks  . Drug use: Yes    Types: Other-see comments, Amphetamines, Fentanyl, Heroin    Comment: subutex tx, snorted heroin last on 5/8    Review of Systems Patient denies headaches, rhinorrhea, blurry vision, numbness, shortness of breath, chest pain, edema, cough, abdominal pain, nausea, vomiting, diarrhea, dysuria, fevers, rashes or hallucinations unless otherwise stated above in HPI. ____________________________________________   PHYSICAL EXAM:  VITAL SIGNS: Vitals:   12/06/19 1331  BP: 113/73  Pulse: 79  Resp: 20  SpO2: 98%    Constitutional: Alert Eyes: Conjunctivae are normal.  Head: Atraumatic. Nose: No congestion/rhinnorhea. Mouth/Throat: Mucous membranes are moist.   Neck: No stridor. Painless ROM.  Cardiovascular: Normal rate, regular rhythm. Grossly normal heart sounds.  Good peripheral circulation. Respiratory: Normal respiratory effort.  No  retractions. Lungs CTAB. Gastrointestinal: Soft and nontender. No distention. No abdominal bruits. No CVA tenderness. Genitourinary:  Musculoskeletal: No lower extremity tenderness nor edema.  No joint effusions. Neurologic:  Normal speech and language. No gross focal neurologic deficits are  appreciated. No facial droop Skin:  Skin is warm, dry and intact. No rash noted. Psychiatric: pressured speech, hypervigilant, unwilling to provide history ____________________________________________   LABS (all labs ordered are listed, but only abnormal results are displayed)  Results for orders placed or performed during the hospital encounter of 12/06/19 (from the past 24 hour(s))  cbc     Status: None   Collection Time: 12/06/19  1:33 PM  Result Value Ref Range   WBC 8.0 4.0 - 10.5 K/uL   RBC 4.69 3.87 - 5.11 MIL/uL   Hemoglobin 13.9 12.0 - 15.0 g/dL   HCT 03.4 36 - 46 %   MCV 85.9 80.0 - 100.0 fL   MCH 29.6 26.0 - 34.0 pg   MCHC 34.5 30.0 - 36.0 g/dL   RDW 74.2 59.5 - 63.8 %   Platelets 330 150 - 400 K/uL   nRBC 0.0 0.0 - 0.2 %   ____________________________________________ ______________________________  RADIOLOGY   ____________________________________________   PROCEDURES  Procedure(s) performed:  Procedures    Critical Care performed: no ____________________________________________   INITIAL IMPRESSION / ASSESSMENT AND PLAN / ED COURSE  Pertinent labs & imaging results that were available during my care of the patient were reviewed by me and considered in my medical decision making (see chart for details).   DDX: Psychosis, delirium, medication effect, noncompliance, polysubstance abuse, Si, Hi, depression   Jill Shepherd is a 24 y.o. who presents to the ED with for evaluation of ams in the setting of bipolar d/o and substance abuse.  Patient has psych history of polysubstance abuse.  Laboratory testing was ordered to evaluation for underlying electrolyte derangement or signs of underlying organic pathology to explain today's presentation.  Based on history and physical and laboratory evaluation, it appears that the patient's presentation is 2/2 polysubstance abuse with substance induced psychosis.  Given her h/o bipolar disorder and as she arrives under  IVC with potentially self harming behavior will maintain IVC to observe in ED and consult psych.     The patient has been placed in psychiatric observation due to the need to provide a safe environment for the patient while obtaining psychiatric consultation and evaluation, as well as ongoing medical and medication management to treat the patient's condition.  The patient has been placed under full IVC at this time.   The patient was evaluated in Emergency Department today for the symptoms described in the history of present illness. He/she was evaluated in the context of the global COVID-19 pandemic, which necessitated consideration that the patient might be at risk for infection with the SARS-CoV-2 virus that causes COVID-19. Institutional protocols and algorithms that pertain to the evaluation of patients at risk for COVID-19 are in a state of rapid change based on information released by regulatory bodies including the CDC and federal and state organizations. These policies and algorithms were followed during the patient's care in the ED.  As part of my medical decision making, I reviewed the following data within the electronic MEDICAL RECORD NUMBER Nursing notes reviewed and incorporated, Labs reviewed, notes from prior ED visits and [place] Controlled Substance Database   ____________________________________________   FINAL CLINICAL IMPRESSION(S) / ED DIAGNOSES  Final diagnoses:  Polysubstance abuse (HCC)      NEW  MEDICATIONS STARTED DURING THIS VISIT:  New Prescriptions   No medications on file     Note:  This document was prepared using Dragon voice recognition software and may include unintentional dictation errors.    Willy Eddy, MD 12/06/19 1402

## 2019-12-06 NOTE — ED Notes (Signed)
VOLUNTARY/Dr Clapacs rescinded IVC and will d/c later papers on chart for the same

## 2019-12-06 NOTE — ED Notes (Signed)
Pt gives verbal consent to update mother Cala Bradford, listed in chart) on care. Pt mother updated on situation and plan. Pt mother states she wants pt to be be under IVC, and wants to go the magistrates office in the AM. Pt mother expresses concern for pt's safety and wellbeing if she were to leave.   Will inform EDP and have him call pt mother back to discuss

## 2019-12-06 NOTE — ED Notes (Signed)
Psychiatrist is currently at her bedside  

## 2019-12-06 NOTE — ED Triage Notes (Signed)
Pt in via [place] [employer] under IVC. Pt not cooperative in triage and is refusing to have temperature taken.

## 2019-12-06 NOTE — Consult Note (Signed)
[facility]   Reason for Consult: Consult for 24 year old woman with a history of substance abuse brought in for bizarre behavior Referring Physician: Roxan Hockeyobinson Patient Identification: Jill HalonBrandy D Raska MRN:  [id number] Principal Diagnosis: Amphetamine and psychostimulant-induced psychosis with hallucinations (HCC) Diagnosis:  Principal Problem:   Amphetamine and psychostimulant-induced psychosis with hallucinations (HCC) Active Problems:   Methamphetamine abuse (HCC)   Bipolar 1 disorder (HCC)   Total Time spent with patient: 1 hour  Subjective:   Jill Shepherd is a 24 y.o. female patient admitted with "my mom and I had an argument".  HPI: 24 year old woman with a history of substance abuse was brought in by law enforcement who are evidently called to her home.  It is reported that someone said she was standing outside naked although it sounds like when law enforcement got there perhaps she had put on some clothing.  She was disorganized psychotic and bizarre in her behavior nonetheless.  Brought into emergency room.  Patient was rambling and hard to redirect.  She does admit to me that she relapsed and has been using methamphetamine.  Denies other drug use.  Patient claims she is still taking Seroquel regularly but later on in the conversation indicates she may have run out of it.  Sleep is been poor.  Mood is been labile.  Patient is clearly hallucinating during the conversation frequently talking to people who are not there.  No evidence of suicidal behavior.  Denies suicidal ideation.  She is itching all over picking at her skin and in general looking intoxicated on amphetamines.  Past Psychiatric History: Past history of substance abuse as well as possible bipolar disorder.  Has been treated with Seroquel in the past but it seems like probably the substance abuse has been the larger factor  Risk to Self:   Risk to Others:   Prior Inpatient Therapy:   Prior  Outpatient Therapy:    Past Medical History:  Past Medical History:  Diagnosis Date  . Drug abuse (HCC)   . Headache   . SAB (spontaneous abortion) 01/19/2014   stillborn at 20.4 wks    Past Surgical History:  Procedure Laterality Date  . INCISION AND DRAINAGE ABSCESS Left 05/07/2019   Procedure: INCISION AND DRAINAGE ABSCESS;  Surgeon: Leafy RoPabon, Diego F, MD;  Location: [facility] ORS;  Service: General;  Laterality: Left;   Family History:  Family History  Problem Relation Age of Onset  . Hypertension Father   . Hypertension Paternal Grandfather    Family Psychiatric  History: None reported Social History:  Social History   Substance and Sexual Activity  Alcohol Use No  . Alcohol/week: 0.0 standard drinks     Social History   Substance and Sexual Activity  Drug Use Yes  . Types: Other-see comments, Amphetamines, Fentanyl, Heroin   Comment: subutex tx, snorted heroin last on 5/8    Social History   Socioeconomic History  . Marital status: Legally Separated    Spouse name: Not on file  . Number of children: 1  . Years of education: Not on file  . Highest education level: Not on file  Occupational History    Employer: [employer]  Tobacco Use  . Smoking status: Never Smoker  . Smokeless tobacco: Never Used  Vaping Use  . Vaping Use: Never used  Substance and Sexual Activity  . Alcohol use: No    Alcohol/week: 0.0 standard drinks  . Drug use: Yes    Types: Other-see comments, Amphetamines, Fentanyl, Heroin  Comment: subutex tx, snorted heroin last on 5/8  . Sexual activity: Yes    Partners: Male    Birth control/protection: None  Other Topics Concern  . Not on file  Social History Narrative  . Not on file   Social Determinants of Health   Financial Resource Strain:   . Difficulty of Paying Living Expenses: Not on file  Food Insecurity:   . Worried About Programme researcher, broadcasting/film/video in the Last Year: Not on file  . Ran Out of Food in the Last Year: Not on file    Transportation Needs:   . Lack of Transportation (Medical): Not on file  . Lack of Transportation (Non-Medical): Not on file  Physical Activity:   . Days of Exercise per Week: Not on file  . Minutes of Exercise per Session: Not on file  Stress:   . Feeling of Stress : Not on file  Social Connections:   . Frequency of Communication with Friends and Family: Not on file  . Frequency of Social Gatherings with Friends and Family: Not on file  . Attends Religious Services: Not on file  . Active Member of Clubs or Organizations: Not on file  . Attends Banker Meetings: Not on file  . Marital Status: Not on file   Additional Social History:    Allergies:  No Known Allergies  Labs:  Results for orders placed or performed during the hospital encounter of 12/06/19 (from the past 48 hour(s))  Comprehensive metabolic panel     Status: None   Collection Time: 12/06/19  1:33 PM  Result Value Ref Range   Sodium 140 135 - 145 mmol/L   Potassium 3.8 3.5 - 5.1 mmol/L   Chloride 101 98 - 111 mmol/L   CO2 27 22 - 32 mmol/L   Glucose, Bld 92 70 - 99 mg/dL    Comment: Glucose reference range applies only to samples taken after fasting for at least 8 hours.   BUN 17 6 - 20 mg/dL   Creatinine, Ser 7.89 0.44 - 1.00 mg/dL   Calcium 9.9 8.9 - 38.1 mg/dL   Total Protein 8.0 6.5 - 8.1 g/dL   Albumin 4.6 3.5 - 5.0 g/dL   AST 34 15 - 41 U/L   ALT 28 0 - 44 U/L   Alkaline Phosphatase 70 38 - 126 U/L   Total Bilirubin 0.7 0.3 - 1.2 mg/dL   GFR, Estimated >01 >75 mL/min    Comment: (NOTE) Calculated using the CKD-EPI Creatinine Equation (2021)    Anion gap 12 5 - 15    Comment: Performed at [facility], [address] [place], [place] [address]  Ethanol     Status: None   Collection Time: 12/06/19  1:33 PM  Result Value Ref Range   Alcohol, Ethyl (B) <10 <10 mg/dL    Comment: (NOTE) Lowest detectable limit for serum alcohol is 10 mg/dL.  For medical purposes  only. Performed at [facility], [address] [place], [place] [address]   Salicylate level     Status: Abnormal   Collection Time: 12/06/19  1:33 PM  Result Value Ref Range   Salicylate Lvl <7.0 (L) 7.0 - 30.0 mg/dL    Comment: Performed at [facility], [address] [place], [place] [address]  Acetaminophen level     Status: Abnormal   Collection Time: 12/06/19  1:33 PM  Result Value Ref Range   Acetaminophen (Tylenol), Serum <10 (L) 10 - 30 ug/mL  Comment: (NOTE) Therapeutic concentrations vary significantly. A range of 10-30 ug/mL  may be an effective concentration for many patients. However, some  are best treated at concentrations outside of this range. Acetaminophen concentrations >150 ug/mL at 4 hours after ingestion  and >50 ug/mL at 12 hours after ingestion are often associated with  toxic reactions.  Performed at [facility], [address] [place], [place] [address]   cbc     Status: None   Collection Time: 12/06/19  1:33 PM  Result Value Ref Range   WBC 8.0 4.0 - 10.5 K/uL   RBC 4.69 3.87 - 5.11 MIL/uL   Hemoglobin 13.9 12.0 - 15.0 g/dL   HCT 60.4 36 - 46 %   MCV 85.9 80.0 - 100.0 fL   MCH 29.6 26.0 - 34.0 pg   MCHC 34.5 30.0 - 36.0 g/dL   RDW 54.0 98.1 - 19.1 %   Platelets 330 150 - 400 K/uL   nRBC 0.0 0.0 - 0.2 %    Comment: Performed at [facility], [address] [place], [place] [address]  Urine Drug Screen, Qualitative     Status: Abnormal   Collection Time: 12/06/19  1:33 PM  Result Value Ref Range   Tricyclic, Ur Screen NONE DETECTED NONE DETECTED   Amphetamines, Ur Screen POSITIVE (A) NONE DETECTED   MDMA (Ecstasy)Ur Screen NONE DETECTED NONE DETECTED   Cocaine Metabolite,Ur [place] NONE DETECTED NONE DETECTED   Opiate, Ur Screen NONE DETECTED NONE DETECTED   Phencyclidine (PCP) Ur S NONE DETECTED NONE DETECTED   Cannabinoid 50 Ng, Ur [place] POSITIVE (A) NONE DETECTED   Barbiturates, Ur Screen NONE  DETECTED NONE DETECTED   Benzodiazepine, Ur Scrn NONE DETECTED NONE DETECTED   Methadone Scn, Ur NONE DETECTED NONE DETECTED    Comment: (NOTE) Tricyclics + metabolites, urine    Cutoff 1000 ng/mL Amphetamines + metabolites, urine  Cutoff 1000 ng/mL MDMA (Ecstasy), urine              Cutoff 500 ng/mL Cocaine Metabolite, urine          Cutoff 300 ng/mL Opiate + metabolites, urine        Cutoff 300 ng/mL Phencyclidine (PCP), urine         Cutoff 25 ng/mL Cannabinoid, urine                 Cutoff 50 ng/mL Barbiturates + metabolites, urine  Cutoff 200 ng/mL Benzodiazepine, urine              Cutoff 200 ng/mL Methadone, urine                   Cutoff 300 ng/mL  The urine drug screen provides only a preliminary, unconfirmed analytical test result and should not be used for non-medical purposes. Clinical consideration and professional judgment should be applied to any positive drug screen result due to possible interfering substances. A more specific alternate chemical method must be used in order to obtain a confirmed analytical result. Gas chromatography / mass spectrometry (GC/MS) is the preferred confirm atory method. Performed at [facility], [address] [place], [place] [address]     Current Facility-Administered Medications  Medication Dose Route Frequency Provider Last Rate Last Admin  . QUEtiapine (SEROQUEL) tablet 100 mg  100 mg Oral QHS Khayri Kargbo T, MD       Current Outpatient Medications  Medication Sig Dispense Refill  . hydrOXYzine (ATARAX/VISTARIL) 50 MG tablet Take 1 tablet (50  mg total) by mouth 3 (three) times daily as needed (PRN). 30 tablet 1  . QUEtiapine (SEROQUEL) 100 MG tablet Take 1 tablet (100 mg total) by mouth at bedtime. 30 tablet 1    Musculoskeletal: Strength & Muscle Tone: within normal limits Gait & Station: normal Patient leans: N/A  Psychiatric Specialty Exam: Physical Exam Vitals and nursing note reviewed.  Constitutional:       Appearance: She is well-developed.  HENT:     Head: Normocephalic and atraumatic.  Eyes:     Conjunctiva/sclera: Conjunctivae normal.     Pupils: Pupils are equal, round, and reactive to light.  Cardiovascular:     Heart sounds: Normal heart sounds.  Pulmonary:     Effort: Pulmonary effort is normal.  Abdominal:     Palpations: Abdomen is soft.  Musculoskeletal:        General: Normal range of motion.     Cervical back: Normal range of motion.  Skin:    General: Skin is warm and dry.       Neurological:     General: No focal deficit present.     Mental Status: She is alert.  Psychiatric:        Attention and Perception: She is inattentive. She perceives auditory and visual hallucinations.        Mood and Affect: Affect is labile.        Speech: Speech is rapid and pressured.        Behavior: Behavior is agitated. Behavior is not aggressive.        Thought Content: Thought content is paranoid. Thought content does not include homicidal or suicidal ideation.        Cognition and Memory: Cognition is impaired.        Judgment: Judgment is impulsive.     Review of Systems  Constitutional: Negative.   HENT: Negative.   Eyes: Negative.   Respiratory: Negative.   Cardiovascular: Negative.   Gastrointestinal: Negative.   Musculoskeletal: Negative.   Skin: Negative.   Neurological: Negative.   Psychiatric/Behavioral: Positive for behavioral problems, hallucinations and sleep disturbance. The patient is nervous/anxious.     Blood pressure 113/73, pulse 79, resp. rate 20, height 5\' 2"  (1.575 m), weight 43.1 kg, last menstrual period 12/01/2019, SpO2 98 %, unknown if currently breastfeeding.Body mass index is 17.38 kg/m.  General Appearance: Disheveled  Eye Contact:  Minimal  Speech:  Pressured  Volume:  Increased  Mood:  Euphoric and Irritable  Affect:  Inappropriate and Labile  Thought Process:  Disorganized  Orientation:  Full (Time, Place, and Person)  Thought  Content:  Logical and Illogical  Suicidal Thoughts:  No  Homicidal Thoughts:  No  Memory:  Immediate;   Fair Recent;   Poor Remote;   Fair  Judgement:  Impaired  Insight:  Shallow  Psychomotor Activity:  Restlessness  Concentration:  Concentration: Poor  Recall:  13/04/2019 of Knowledge:  Fair  Language:  Fair  Akathisia:  No  Handed:  Right  AIMS (if indicated):     Assets:  Desire for Improvement Housing Physical Health Resilience  ADL's:  Impaired  Cognition:  Impaired,  Mild  Sleep:        Treatment Plan Summary: Plan Currently appears to be intoxicated on amphetamines.  Not threatening however.  No evidence of suicidal behavior.  Patient actually expresses a willingness to be cooperative with medicine.  Patient probably would does not require inpatient hospitalization once the amphetamines wear off.  I have  ordered a dose of Seroquel for her now and printed a prescription.  IVC discontinued.  I spoke with the ER physicians and suggested that once she is no longer obviously psychotic she could be discharged.  Disposition: Patient does not meet criteria for psychiatric inpatient admission. Supportive therapy provided about ongoing stressors.  Mordecai Rasmussen, MD 12/06/2019 4:34 PM

## 2019-12-06 NOTE — ED Notes (Signed)
Pt declines VS at this time

## 2019-12-07 ENCOUNTER — Other Ambulatory Visit: Payer: Self-pay

## 2019-12-07 ENCOUNTER — Encounter: Payer: Self-pay | Admitting: Psychiatry

## 2019-12-07 ENCOUNTER — Inpatient Hospital Stay
Admission: AD | Admit: 2019-12-07 | Discharge: 2019-12-23 | DRG: 885 | Disposition: A | Discharge status: Home / Self Care | Payer: No Typology Code available for payment source | Source: Intra-hospital | Attending: Family Medicine | Admitting: Family Medicine

## 2019-12-07 DIAGNOSIS — F1110 Opioid abuse, uncomplicated: Secondary | ICD-10-CM | POA: Diagnosis present

## 2019-12-07 DIAGNOSIS — F419 Anxiety disorder, unspecified: Secondary | ICD-10-CM | POA: Diagnosis present

## 2019-12-07 DIAGNOSIS — Z813 Family history of other psychoactive substance abuse and dependence: Secondary | ICD-10-CM | POA: Diagnosis not present

## 2019-12-07 DIAGNOSIS — F15151 Other stimulant abuse with stimulant-induced psychotic disorder with hallucinations: Secondary | ICD-10-CM | POA: Diagnosis present

## 2019-12-07 DIAGNOSIS — B1920 Unspecified viral hepatitis C without hepatic coma: Secondary | ICD-10-CM | POA: Diagnosis present

## 2019-12-07 DIAGNOSIS — Z79899 Other long term (current) drug therapy: Secondary | ICD-10-CM

## 2019-12-07 DIAGNOSIS — R45851 Suicidal ideations: Secondary | ICD-10-CM | POA: Diagnosis present

## 2019-12-07 DIAGNOSIS — Z811 Family history of alcohol abuse and dependence: Secondary | ICD-10-CM | POA: Diagnosis not present

## 2019-12-07 DIAGNOSIS — Z20822 Contact with and (suspected) exposure to covid-19: Secondary | ICD-10-CM | POA: Diagnosis present

## 2019-12-07 DIAGNOSIS — F15951 Other stimulant use, unspecified with stimulant-induced psychotic disorder with hallucinations: Secondary | ICD-10-CM | POA: Diagnosis not present

## 2019-12-07 DIAGNOSIS — F3130 Bipolar disorder, current episode depressed, mild or moderate severity, unspecified: Secondary | ICD-10-CM | POA: Diagnosis present

## 2019-12-07 DIAGNOSIS — F1510 Other stimulant abuse, uncomplicated: Secondary | ICD-10-CM | POA: Diagnosis present

## 2019-12-07 DIAGNOSIS — F319 Bipolar disorder, unspecified: Secondary | ICD-10-CM | POA: Diagnosis present

## 2019-12-07 DIAGNOSIS — G2581 Restless legs syndrome: Secondary | ICD-10-CM | POA: Diagnosis present

## 2019-12-07 MED ORDER — MAGNESIUM HYDROXIDE 400 MG/5ML PO SUSP: 30.0000 mL | Freq: Every day | ORAL | Status: DC | PRN

## 2019-12-07 MED ORDER — HYDROXYZINE HCL 50 MG PO TABS
50.0000 mg | ORAL_TABLET | Freq: Three times a day (TID) | ORAL | Status: DC | PRN
  Administered 2019-12-07 – 2019-12-20 (×25): 50 mg via ORAL
  Filled 2019-12-07 (×27): qty 1

## 2019-12-07 MED ORDER — ALUM & MAG HYDROXIDE-SIMETH 200-200-20 MG/5ML PO SUSP
30.0000 mL | ORAL | Status: DC | PRN
  Administered 2019-12-15 – 2019-12-22 (×3): 30 mL via ORAL
  Filled 2019-12-07 (×3): qty 30

## 2019-12-07 MED ORDER — NICOTINE 21 MG/24HR TD PT24
21.0000 mg | MEDICATED_PATCH | Freq: Once | TRANSDERMAL | Status: DC
  Administered 2019-12-07: 21 mg via TRANSDERMAL
  Filled 2019-12-07: qty 1

## 2019-12-07 MED ORDER — NICOTINE 21 MG/24HR TD PT24
21.0000 mg | MEDICATED_PATCH | Freq: Every day | TRANSDERMAL | Status: DC
  Administered 2019-12-10 – 2019-12-23 (×15): 21 mg via TRANSDERMAL
  Filled 2019-12-07 (×15): qty 1

## 2019-12-07 MED ORDER — INFLUENZA VAC SPLIT QUAD 0.5 ML IM SUSY
0.5000 mL | PREFILLED_SYRINGE | INTRAMUSCULAR | Status: DC
  Filled 2019-12-07 (×2): qty 0.5

## 2019-12-07 MED ORDER — QUETIAPINE FUMARATE 100 MG PO TABS
100.0000 mg | ORAL_TABLET | Freq: Every day | ORAL | Status: DC
  Administered 2019-12-07 – 2019-12-12 (×6): 100 mg via ORAL
  Filled 2019-12-07 (×7): qty 1

## 2019-12-07 MED ORDER — ACETAMINOPHEN 325 MG PO TABS
650.0000 mg | ORAL_TABLET | Freq: Four times a day (QID) | ORAL | Status: DC | PRN
  Administered 2019-12-10 – 2019-12-22 (×14): 650 mg via ORAL
  Filled 2019-12-07 (×14): qty 2

## 2019-12-07 NOTE — ED Notes (Signed)
Pt given sprite. Denies further needs. Pt calm, cooperative, and pleasant at this time.

## 2019-12-07 NOTE — Consult Note (Signed)
[facility] Face-to-Face Psychiatry Consult   Reason for Consult:  Follow-up consult for this 24 year old woman with a history of drug abuse and mood disorder Referring Physician:   Quale Patient Identification: Jill Shepherd MRN:  [id number] Principal Diagnosis: Bipolar 1 disorder (HCC) Diagnosis:  Principal Problem:   Bipolar 1 disorder (HCC) Active Problems:   Methamphetamine abuse (HCC)   Amphetamine and psychostimulant-induced psychosis with hallucinations (HCC)   Total Time spent with patient: 30 minutes  Subjective:   Jill Shepherd is a 24 y.o. female patient admitted with patient currently not talkative.  HPI:   See previous note.  24 year old woman with a history of substance abuse and mood disorder.  Last night appeared intoxicated on amphetamines.  Plan had been for discharge once she was sober.  Mother called Dr. Larinda Buttery and complained, resulting in patient being kept in the ER.  On reexamination today she is asleep and cannot be aroused enough to have a conversation.  Moans and turns over and does not engage in talk.  Not able to have a reasonable conversation about planning today.  Past Psychiatric History: Past history of substance abuse and possible bipolar disorder with medication prescriptions in the past  Risk to Self:   Risk to Others:   Prior Inpatient Therapy:   Prior Outpatient Therapy:    Past Medical History:  Past Medical History:  Diagnosis Date  . Drug abuse (HCC)   . Headache   . SAB (spontaneous abortion) 01/19/2014   stillborn at 20.4 wks    Past Surgical History:  Procedure Laterality Date  . INCISION AND DRAINAGE ABSCESS Left 05/07/2019   Procedure: INCISION AND DRAINAGE ABSCESS;  Surgeon: Leafy Ro, MD;  Location: [facility] ORS;  Service: General;  Laterality: Left;   Family History:  Family History  Problem Relation Age of Onset  . Hypertension Father   . Hypertension Paternal Grandfather    Family Psychiatric  History: See previous Social  History:  Social History   Substance and Sexual Activity  Alcohol Use No  . Alcohol/week: 0.0 standard drinks     Social History   Substance and Sexual Activity  Drug Use Yes  . Types: Other-see comments, Amphetamines, Fentanyl, Heroin   Comment: subutex tx, snorted heroin last on 5/8    Social History   Socioeconomic History  . Marital status: Legally Separated    Spouse name: Not on file  . Number of children: 1  . Years of education: Not on file  . Highest education level: Not on file  Occupational History    Employer: [employer]  Tobacco Use  . Smoking status: Never Smoker  . Smokeless tobacco: Never Used  Vaping Use  . Vaping Use: Never used  Substance and Sexual Activity  . Alcohol use: No    Alcohol/week: 0.0 standard drinks  . Drug use: Yes    Types: Other-see comments, Amphetamines, Fentanyl, Heroin    Comment: subutex tx, snorted heroin last on 5/8  . Sexual activity: Yes    Partners: Male    Birth control/protection: None  Other Topics Concern  . Not on file  Social History Narrative  . Not on file   Social Determinants of Health   Financial Resource Strain:   . Difficulty of Paying Living Expenses: Not on file  Food Insecurity:   . Worried About Programme researcher, broadcasting/film/video in the Last Year: Not on file  . Ran Out of Food in the Last Year: Not on file  Transportation Needs:   .  Lack of Transportation (Medical): Not on file  . Lack of Transportation (Non-Medical): Not on file  Physical Activity:   . Days of Exercise per Week: Not on file  . Minutes of Exercise per Session: Not on file  Stress:   . Feeling of Stress : Not on file  Social Connections:   . Frequency of Communication with Friends and Family: Not on file  . Frequency of Social Gatherings with Friends and Family: Not on file  . Attends Religious Services: Not on file  . Active Member of Clubs or Organizations: Not on file  . Attends Banker Meetings: Not on file  . Marital  Status: Not on file   Additional Social History:    Allergies:  No Known Allergies  Labs:  Results for orders placed or performed during the hospital encounter of 12/06/19 (from the past 48 hour(s))  Comprehensive metabolic panel     Status: None   Collection Time: 12/06/19  1:33 PM  Result Value Ref Range   Sodium 140 135 - 145 mmol/L   Potassium 3.8 3.5 - 5.1 mmol/L   Chloride 101 98 - 111 mmol/L   CO2 27 22 - 32 mmol/L   Glucose, Bld 92 70 - 99 mg/dL    Comment: Glucose reference range applies only to samples taken after fasting for at least 8 hours.   BUN 17 6 - 20 mg/dL   Creatinine, Ser 1.61 0.44 - 1.00 mg/dL   Calcium 9.9 8.9 - 09.6 mg/dL   Total Protein 8.0 6.5 - 8.1 g/dL   Albumin 4.6 3.5 - 5.0 g/dL   AST 34 15 - 41 U/L   ALT 28 0 - 44 U/L   Alkaline Phosphatase 70 38 - 126 U/L   Total Bilirubin 0.7 0.3 - 1.2 mg/dL   GFR, Estimated >04 >54 mL/min    Comment: (NOTE) Calculated using the CKD-EPI Creatinine Equation (2021)    Anion gap 12 5 - 15    Comment: Performed at [facility], [address], [place], [place] [address]  Ethanol     Status: None   Collection Time: 12/06/19  1:33 PM  Result Value Ref Range   Alcohol, Ethyl (B) <10 <10 mg/dL    Comment: (NOTE) Lowest detectable limit for serum alcohol is 10 mg/dL.  For medical purposes only. Performed at  [facility], [address] [place], [place] [address]   Salicylate level     Status: Abnormal   Collection Time: 12/06/19  1:33 PM  Result Value Ref Range   Salicylate Lvl <7.0 (L) 7.0 - 30.0 mg/dL    Comment: Performed at [facility], [address] [place], [place] [address]  Acetaminophen level     Status: Abnormal   Collection Time: 12/06/19  1:33 PM  Result Value Ref Range   Acetaminophen (Tylenol), Serum <10 (L) 10 - 30 ug/mL    Comment: (NOTE) Therapeutic concentrations vary significantly. A range of 10-30 ug/mL  may be an effective concentration for many  patients. However, some  are best treated at concentrations outside of this range. Acetaminophen concentrations >150 ug/mL at 4 hours after ingestion  and >50 ug/mL at 12 hours after ingestion are often associated with  toxic reactions.  Performed at [facility], [address] [place], [place] [address]   cbc     Status: None   Collection Time: 12/06/19  1:33 PM  Result Value Ref Range   WBC 8.0 4.0 - 10.5 K/uL  RBC 4.69 3.87 - 5.11 MIL/uL   Hemoglobin 13.9 12.0 - 15.0 g/dL   HCT 32.3 36 - 46 %   MCV 85.9 80.0 - 100.0 fL   MCH 29.6 26.0 - 34.0 pg   MCHC 34.5 30.0 - 36.0 g/dL   RDW 55.7 32.2 - 02.5 %   Platelets 330 150 - 400 K/uL   nRBC 0.0 0.0 - 0.2 %    Comment: Performed at [facility], [address], [place], [place] [address]  Urine Drug Screen, Qualitative     Status: Abnormal   Collection Time: 12/06/19  1:33 PM  Result Value Ref Range   Tricyclic, Ur Screen NONE DETECTED NONE DETECTED   Amphetamines, Ur Screen POSITIVE (A) NONE DETECTED   MDMA (Ecstasy)Ur Screen NONE DETECTED NONE DETECTED   Cocaine Metabolite,Ur [place] NONE DETECTED NONE DETECTED   Opiate, Ur Screen NONE DETECTED NONE DETECTED   Phencyclidine (PCP) Ur S NONE DETECTED NONE DETECTED   Cannabinoid 50 Ng, Ur [place] POSITIVE (A) NONE DETECTED   Barbiturates, Ur Screen NONE DETECTED NONE DETECTED   Benzodiazepine, Ur Scrn NONE DETECTED NONE DETECTED   Methadone Scn, Ur NONE DETECTED NONE DETECTED    Comment: (NOTE) Tricyclics + metabolites, urine    Cutoff 1000 ng/mL Amphetamines + metabolites, urine  Cutoff 1000 ng/mL MDMA (Ecstasy), urine              Cutoff 500 ng/mL Cocaine Metabolite, urine          Cutoff 300 ng/mL Opiate + metabolites, urine        Cutoff 300 ng/mL Phencyclidine (PCP), urine         Cutoff 25 ng/mL Cannabinoid, urine                 Cutoff 50 ng/mL Barbiturates + metabolites, urine  Cutoff 200 ng/mL Benzodiazepine, urine              Cutoff 200  ng/mL Methadone, urine                   Cutoff 300 ng/mL  The urine drug screen provides only a preliminary, unconfirmed analytical test result and should not be used for non-medical purposes. Clinical consideration and professional judgment should be applied to any positive drug screen result due to possible interfering substances. A more specific alternate chemical method must be used in order to obtain a confirmed analytical result. Gas chromatography / mass spectrometry (GC/MS) is the preferred confirm atory method. Performed at [facility], [address] [place], [place] [address]   Respiratory Panel by RT PCR (Flu A&B, Covid) - Nasopharyngeal Swab     Status: None   Collection Time: 12/06/19  4:34 PM   Specimen: Nasopharyngeal Swab  Result Value Ref Range   SARS Coronavirus 2 by RT PCR NEGATIVE NEGATIVE    Comment: (NOTE) SARS-CoV-2 target nucleic acids are NOT DETECTED.  The SARS-CoV-2 RNA is generally detectable in upper respiratoy specimens during the acute phase of infection. The lowest concentration of SARS-CoV-2 viral copies this assay can detect is 131 copies/mL. A negative result does not preclude SARS-Cov-2 infection and should not be used as the sole basis for treatment or other patient management decisions. A negative result may occur with  improper specimen collection/handling, submission of specimen other than nasopharyngeal swab, presence of viral mutation(s) within the areas targeted by this assay, and inadequate number of viral copies (<131 copies/mL). A negative result must be combined with clinical observations, patient  history, and epidemiological information. The expected result is Negative.  Fact Sheet for Patients:  https://www.moore.com/https://www.fda.gov/media/142436/download  Fact Sheet for Healthcare Providers:  https://www.young.biz/https://www.fda.gov/media/142435/download  This test is no t yet approved or cleared by the [place] FDA and  has been authorized for  detection and/or diagnosis of SARS-CoV-2 by FDA under an Emergency Use Authorization (EUA). This EUA will remain  in effect (meaning this test can be used) for the duration of the COVID-19 declaration under Section 564(b)(1) of the Act, 21 U.S.C. section 360bbb-3(b)(1), unless the authorization is terminated or revoked sooner.     Influenza A by PCR NEGATIVE NEGATIVE   Influenza B by PCR NEGATIVE NEGATIVE    Comment: (NOTE) The Xpert Xpress SARS-CoV-2/FLU/RSV assay is intended as an aid in  the diagnosis of influenza from Nasopharyngeal swab specimens and  should not be used as a sole basis for treatment. Nasal washings and  aspirates are unacceptable for Xpert Xpress SARS-CoV-2/FLU/RSV  testing.  Fact Sheet for Patients: https://www.moore.com/https://www.fda.gov/media/142436/download  Fact Sheet for Healthcare Providers: https://www.young.biz/https://www.fda.gov/media/142435/download  This test is not yet approved or cleared by the [place] FDA and  has been authorized for detection and/or diagnosis of SARS-CoV-2 by  FDA under an Emergency Use Authorization (EUA). This EUA will remain  in effect (meaning this test can be used) for the duration of the  Covid-19 declaration under Section 564(b)(1) of the Act, 21  U.S.C. section 360bbb-3(b)(1), unless the authorization is  terminated or revoked. Performed at [facility], [address] [place], [place] [address]     Current Facility-Administered Medications  Medication Dose Route Frequency Provider Last Rate Last Admin  . QUEtiapine (SEROQUEL) tablet 100 mg  100 mg Oral QHS Jaekwon Mcclune, Jackquline DenmarkJohn T, MD   100 mg at 12/06/19 2041   Current Outpatient Medications  Medication Sig Dispense Refill  . QUEtiapine (SEROQUEL) 100 MG tablet Take 1 tablet (100 mg total) by mouth at bedtime. 30 tablet 1  . hydrOXYzine (ATARAX/VISTARIL) 50 MG tablet Take 1 tablet (50 mg total) by mouth 3 (three) times daily as needed (PRN). (Patient not taking: Reported on 12/07/2019) 30  tablet 1    Musculoskeletal: Strength & Muscle Tone: decreased Gait & Station: unable to stand Patient leans: Right  Psychiatric Specialty Exam: Physical Exam Vitals and nursing note reviewed.  Constitutional:      Appearance: She is well-developed.  HENT:     Head: Normocephalic and atraumatic.  Eyes:     Conjunctiva/sclera: Conjunctivae normal.     Pupils: Pupils are equal, round, and reactive to light.  Cardiovascular:     Heart sounds: Normal heart sounds.  Pulmonary:     Effort: Pulmonary effort is normal.  Abdominal:     Palpations: Abdomen is soft.  Musculoskeletal:        General: Normal range of motion.     Cervical back: Normal range of motion.  Skin:    General: Skin is warm and dry.  Neurological:     General: No focal deficit present.     Mental Status: She is alert.  Psychiatric:        Attention and Perception: She is inattentive.        Speech: She is noncommunicative.     Review of Systems  Unable to perform ROS: Patient unresponsive    Blood pressure 115/68, pulse 83, temperature 97.6 F (36.4 C), temperature source Oral, resp. rate 16, height 5\' 2"  (1.575 m), weight 43.1 kg, last menstrual period 12/01/2019, SpO2 96 %, unknown  if currently breastfeeding.Body mass index is 17.38 kg/m.  General Appearance: Disheveled  Eye Contact:  None  Speech:  Slow  Volume:  Decreased  Mood:  Negative  Affect:  Constricted  Thought Process:  Disorganized  Orientation:  Negative  Thought Content:  NA  Suicidal Thoughts:  No  Homicidal Thoughts:  No  Memory:  Negative  Judgement:  Impaired  Insight:  Negative  Psychomotor Activity:  Negative  Concentration:  Concentration: Negative  Recall:  Negative  Fund of Knowledge:  Negative  Language:  Negative  Akathisia:  Negative  Handed:  Right  AIMS (if indicated):     Assets:  Desire for Improvement Housing Physical Health  ADL's:  Impaired  Cognition:  Impaired,  Mild  Sleep:        Treatment  Plan Summary: Medication management and Plan On reassessment today patient cannot engage in conversation.  At this point cannot arrange for safe discharge.  On reevaluation better choice would be to admit her to the hospital.  Patient will be admitted to psychiatric ward downstairs with diagnosis of bipolar disorder primary.  Continue current medicine.  Case reviewed with emergency room physician and TTS.  Disposition: Recommend psychiatric Inpatient admission when medically cleared.  Mordecai Rasmussen, MD 12/07/2019 11:12 AM

## 2019-12-07 NOTE — ED Notes (Signed)
Dr clapacs at bedside 

## 2019-12-07 NOTE — ED Notes (Signed)
TTS at bedside. 

## 2019-12-07 NOTE — ED Notes (Signed)
Pt uses bathroom, returns to bed

## 2019-12-07 NOTE — ED Provider Notes (Signed)
Emergency Medicine Observation Re-evaluation Note  Jill Shepherd is a 24 y.o. female, seen on rounds today.  Pt initially presented to the ED for complaints of Mental Health Problem   Physical Exam  BP 113/73 (BP Location: Left Arm)   Pulse 79   Resp 20   Ht 5\' 2"  (1.575 m)   Wt 43.1 kg   LMP 12/01/2019   SpO2 98%   BMI 17.38 kg/m  Physical Exam General: Patient is currently sleeping comfortably in bed Lungs: Patient with no respiratory distress Psych: Patient is not agitated  ED Course / MDM  EKG:    I have reviewed the labs performed to date as well as medications administered while in observation.    Plan  Patient was involuntarily committed.  Psych had seen the patient and removed that.  This was communicated to the patient's mom who became very upset saying she would kill herself that is the patient will kill herself if she came back home.  Currently per Dr. 13/04/2019 we will watch her overnight and ask psychiatry to reassess her in the morning and speak with mom.  We have put in another consult for psychiatry.   Larinda Buttery, MD 12/07/19 [phone]

## 2019-12-07 NOTE — ED Notes (Signed)
Pt is continuing to rest comfortably at this time. Pt asleep in bed

## 2019-12-07 NOTE — ED Notes (Signed)
Food tray was given with juice. 

## 2019-12-07 NOTE — BH Assessment (Signed)
Patient is to be admitted to [facility] by Dr. Toni Amend.  Attending Physician will be Dr. Neale Burly.   Patient has been assigned to room 309, by [facility] Charge Nurse [place].   Intake Paper Work has been signed and placed on patient chart.  ER staff is aware of the admission:  Luanne ER Secretary    Dr. Erma Heritage, ER MD   Lacinda Axon, Patient's Nurse   Ethelene Browns, Patient Access.

## 2019-12-07 NOTE — ED Notes (Signed)
Patient is resting comfortably in bed. Light is off. No other needs found at this moment.

## 2019-12-07 NOTE — ED Notes (Signed)
VOL  PENDING  GOING  TO  BEH MED  TONIGHT 

## 2019-12-07 NOTE — ED Notes (Signed)
Pt awake, ambulatory in room, eating a snack. Pt calm and cooperative. Denies further needs

## 2019-12-07 NOTE — ED Provider Notes (Signed)
Dr. Toni Amend of psychiatry currently advising plan for admission voluntary to inpatient psychiatry at this time   Sharyn Creamer, MD 12/07/19 1339

## 2019-12-08 DIAGNOSIS — F319 Bipolar disorder, unspecified: Secondary | ICD-10-CM

## 2019-12-08 LAB — CHLAMYDIA/NGC RT PCR (ARMC ONLY)
Chlamydia Tr: NOT DETECTED
N gonorrhoeae: NOT DETECTED

## 2019-12-08 LAB — HEPATITIS PANEL, ACUTE
HCV Ab: REACTIVE — AB
Hep A IgM: NONREACTIVE
Hep B C IgM: NONREACTIVE
Hepatitis B Surface Ag: NONREACTIVE

## 2019-12-08 LAB — HIV ANTIBODY (ROUTINE TESTING W REFLEX): HIV Screen 4th Generation wRfx: NONREACTIVE

## 2019-12-08 MED ORDER — QUETIAPINE FUMARATE 25 MG PO TABS
50.0000 mg | ORAL_TABLET | Freq: Once | ORAL | Status: AC
  Administered 2019-12-08: 50 mg via ORAL
  Filled 2019-12-08: qty 2

## 2019-12-08 NOTE — Progress Notes (Signed)
Patient admitted to unit for Bipolar and substance abuse. Pt drug of choice is heroin and meth. Pt reports using 1 gram daily with a cost of 160$ a day. Pt does not work and reports she does what she needs to do to get the drugs. Pt reports has not been on MH meds due to  Medication being at ex-boyfriend house and she was not able to retrieve them. Pt had bad intense argument with mom /fight with mom and mom brought her here to hospital. She was living with mom prior but is unsure if she will be able to go back. Pt reports a possibility of staying with sister, or she is open to inpatient rehab. Pt is pleasant and cooperative. Reports anxiety.  Encouragement and support provided. Prn given for anxiety with good relief. Skin and contraband search completed and witnessed by Tiffany, MHT. No skin issues noted, no contraband found. Fluid and nutrition offered. Re-oriented pt to room and unit. Allowed phone call to family member.  Pt receptive and remains safe on unit with q 15 min checks. Pt currently in bed resting eyes closed in no distress.

## 2019-12-08 NOTE — BHH Group Notes (Signed)
LCSW Group Therapy Note  12/08/2019 2:16 PM  Type of Therapy/Topic:  Group Therapy:  Balance in Life  Participation Level:  Did Not Attend  Description of Group:    This group will address the concept of balance and how it feels and looks when one is unbalanced. Patients will be encouraged to process areas in their lives that are out of balance and identify reasons for remaining unbalanced. Facilitators will guide patients in utilizing problem-solving interventions to address and correct the stressor making their life unbalanced. Understanding and applying boundaries will be explored and addressed for obtaining and maintaining a balanced life. Patients will be encouraged to explore ways to assertively make their unbalanced needs known to significant others in their lives, using other group members and facilitator for support and feedback.  Therapeutic Goals: 1. Patient will identify two or more emotions or situations they have that consume much of in their lives. 2. Patient will identify signs/triggers that life has become out of balance:  3. Patient will identify two ways to set boundaries in order to achieve balance in their lives:  4. Patient will demonstrate ability to communicate their needs through discussion and/or role plays  Summary of Patient Progress: X  Therapeutic Modalities:   Cognitive Behavioral Therapy Solution-Focused Therapy Assertiveness Training  Penni Homans MSW, LCSW 12/08/2019 2:16 PM

## 2019-12-08 NOTE — BHH Counselor (Signed)
CSW attempted to complete pt's PSA. However, pt declined.  Penni Homans, MSW, LCSW 12/08/2019 11:27 AM

## 2019-12-08 NOTE — Progress Notes (Signed)
Pt has been alert and oriented to person, place, time and situation. Pt is calm, cooperative, denies suicidal and homicidal ideation, denies hallucinations, denies feelings of depression and anxiety. Pt is easily irritable, mood is labile, is easily distracted. Pt reports she is tired and wanted to sleep most of the day. Pt provided urine specimen today that was sent to lab. Pt is out for meals late, appetite is good, makes poor eye contact, affect flat. No signs or symptoms of psychosis noted. Will continue to monitor pt per Q15 minute face checks and monitor for safety and progress.

## 2019-12-08 NOTE — [facility] Suicide Risk Assessment (Signed)
[facility] Admission Suicide Risk Assessment   Nursing information obtained from:  Patient Demographic factors:  Unemployed, Caucasian, Adolescent or young adult Current Mental Status:  NA Loss Factors:  NA Historical Factors:  NA Risk Reduction Factors:  NA  Total Time spent with patient: 45 minutes Principal Problem: Bipolar 1 disorder, depressed (HCC) Diagnosis:  Principal Problem:   Bipolar 1 disorder, depressed (HCC) Active Problems:   Anxiety   Opiate abuse, continuous (HCC)   Methamphetamine abuse (HCC)   Amphetamine and psychostimulant-induced psychosis with hallucinations (HCC)  Subjective Data: 24 year old female who presented to hospital under IVC for acute psychosis, agitation, and verbalization of suicidal ideation to mother. While in the ED, patient was able to sober and consent for voluntary admission to our unit. She reports that she has been off all her psychiatric medications (Seroquel and hydroxyzine) for several months, and has not been following-up with [facility]. She notes that off her medications she becomes very labile. She will go days without sleeping, increase activity, and feel very irritable. She notes that she will have explosive anger outbursts towards her mother and brother, and also have several crying spells throughout the day. She appears manic on exam with pressured speech, mood lability, and flight of ideas. She admits to relapsing on heroin and meth. She was previously working at [employer], but has been unemployed for a month. She states she "does whatever it takes" to get the $160 dollars worth of heroin and meth she uses daily. She states she almost always uses her own personal needles, but has shared needles on occasion. She requests to be tested for Hepatitis, HIV, RPR, gonorrhea and chlamydia. She states that she uses drugs sometimes out of boredom, and also at times to self-medicate. She is aware that it causes her to become psychotic and act out aggressively  towards family. She is frustrated with herself for relapsing so many times. She previously attended [facility] intensive outpatient program. She feels that she may need inpatient detox treatment at this time.   Continued Clinical Symptoms:  Alcohol Use Disorder Identification Test Final Score (AUDIT): 0 The "Alcohol Use Disorders Identification Test", Guidelines for Use in Primary Care, Second Edition.  World Science writer [facility]). Score between 0-7:  no or low risk or alcohol related problems. Score between 8-15:  moderate risk of alcohol related problems. Score between 16-19:  high risk of alcohol related problems. Score 20 or above:  warrants further diagnostic evaluation for alcohol dependence and treatment.   CLINICAL FACTORS:   Severe Anxiety and/or Agitation Bipolar Disorder:   Mixed State Alcohol/Substance Abuse/Dependencies More than one psychiatric diagnosis Currently Psychotic Unstable or Poor Therapeutic Relationship Previous Psychiatric Diagnoses and Treatments   Musculoskeletal: Strength & Muscle Tone: within normal limits Gait & Station: normal Patient leans: N/A  Psychiatric Specialty Exam: Physical Exam Vitals and nursing note reviewed.  Constitutional:      Appearance: Normal appearance.  HENT:     Head: Normocephalic and atraumatic.     Nose: Nose normal.     Mouth/Throat:     Mouth: Mucous membranes are moist.     Pharynx: Oropharynx is clear.  Eyes:     Extraocular Movements: Extraocular movements intact.     Conjunctiva/sclera: Conjunctivae normal.     Pupils: Pupils are equal, round, and reactive to light.  Cardiovascular:     Rate and Rhythm: Tachycardia present.     Pulses: Normal pulses.  Pulmonary:     Effort: Pulmonary effort is normal.  Breath sounds: Normal breath sounds.  Abdominal:     General: Abdomen is flat.     Palpations: Abdomen is soft.  Musculoskeletal:        General: No swelling. Normal range of motion.  Skin:    General:  Skin is warm and dry.  Neurological:     General: No focal deficit present.     Mental Status: She is alert and oriented to person, place, and time.  Psychiatric:        Attention and Perception: She is inattentive.        Mood and Affect: Mood is anxious. Affect is labile and tearful.        Speech: Speech is rapid and pressured.        Behavior: Behavior is agitated.        Thought Content: Thought content includes suicidal ideation.        Cognition and Memory: Memory normal. Cognition is impaired.        Judgment: Judgment is impulsive.     Review of Systems  Constitutional: Positive for appetite change, chills and fatigue.  HENT: Negative for rhinorrhea and sore throat.   Eyes: Negative for photophobia and visual disturbance.  Respiratory: Negative for cough and shortness of breath.   Cardiovascular: Negative for chest pain and palpitations.  Gastrointestinal: Positive for diarrhea and nausea. Negative for constipation and vomiting.  Endocrine: Negative for cold intolerance and heat intolerance.  Genitourinary: Negative for difficulty urinating and dysuria.  Musculoskeletal: Negative for arthralgias and myalgias.  Skin: Negative for rash and wound.  Allergic/Immunologic: Negative for environmental allergies and food allergies.  Neurological: Negative for dizziness and headaches.  Hematological: Negative for adenopathy. Does not bruise/bleed easily.  Psychiatric/Behavioral: Positive for agitation, dysphoric mood, sleep disturbance and suicidal ideas. The patient is nervous/anxious and is hyperactive.     Blood pressure 125/78, pulse (!) 108, temperature (!) 97.4 F (36.3 C), temperature source Oral, resp. rate 16, height 5\' 1"  (1.549 m), weight 105 kg, last menstrual period 12/01/2019, SpO2 100 %, unknown if currently breastfeeding.Body mass index is 43.74 kg/m.  General Appearance: Disheveled  Eye Contact:  Fair  Speech:  Pressured  Volume:  Increased  Mood:  Irritable   Affect:  Labile and Tearful  Thought Process:  Disorganized  Orientation:  Full (Time, Place, and Person)  Thought Content:  Tangential  Suicidal Thoughts:  Yes.  without intent/plan  Homicidal Thoughts:  No  Memory:  Immediate;   Fair Recent;   Poor Remote;   Fair  Judgement:  Impaired  Insight:  Fair  Psychomotor Activity:  Increased  Concentration:  Concentration: Poor and Attention Span: Poor  Recall:  13/04/2019 of Knowledge:  Fair  Language:  Fair  Akathisia:  Negative  Handed:  Right  AIMS (if indicated):     Assets:  Communication Skills Desire for Improvement Resilience Social Support  ADL's:  Intact  Cognition:  Impaired,  Mild  Sleep:  Number of Hours: 6.45      COGNITIVE FEATURES THAT CONTRIBUTE TO RISK:  Loss of executive function    SUICIDE RISK:   Moderate:  Frequent suicidal ideation with limited intensity, and duration, some specificity in terms of plans, no associated intent, good self-control, limited dysphoria/symptomatology, some risk factors present, and identifiable protective factors, including available and accessible social support.  PLAN OF CARE: Resume home medications of Seroquel 100 mg QHS, Hydroxyzine 50 mg TID PRN for anxiety. Will order hepatitis panel, HIV, RPR, G/C per patient  request. At this time patient is hopeful to enter inpatient substance abuse treatment.   I certify that inpatient services furnished can reasonably be expected to improve the patient's condition.   Jesse Sans, MD 12/08/2019, 3:17 PM

## 2019-12-08 NOTE — Progress Notes (Signed)
Recreation Therapy Notes   Date: 12/08/2019  Time: 9:30 am   Location: Craft room     Behavioral response: N/A   Intervention Topic: Communication   Discussion/Intervention: Patient did not attend group.   Clinical Observations/Feedback:  Patient did not attend group.   Azaya Goedde LRT/CTRS         Steward Sames 12/08/2019 11:03 AM 

## 2019-12-08 NOTE — Tx Team (Signed)
Initial Treatment Plan 12/08/2019 12:09 AM Jill Shepherd YEM:[id number]    PATIENT STRESSORS: Marital or family conflict Substance abuse   PATIENT STRENGTHS: Average or above average intelligence Capable of independent living Communication skills General fund of knowledge   PATIENT IDENTIFIED PROBLEMS: Bipolar 1  Substance Abuse disorder  Medication non compliance  Patient reports using meth and wants in/out patient tx               DISCHARGE CRITERIA:  Improved stabilization in mood, thinking, and/or behavior  PRELIMINARY DISCHARGE PLAN: Outpatient therapy  PATIENT/FAMILY INVOLVEMENT: This treatment plan has been presented to and reviewed with the patient, Jill Shepherd, and/or family member, .  The patient and family have been given the opportunity to ask questions and make suggestions.  Shelia Media, RN 12/08/2019, 12:09 AM

## 2019-12-08 NOTE — Progress Notes (Signed)
Recreation Therapy Notes  INPATIENT RECREATION THERAPY ASSESSMENT  Patient Details Name: DENAJAH FARIAS MRN: [id number] DOB: 1995/03/22 Today's Date: 12/08/2019       Information Obtained From: Patient  Able to Participate in Assessment/Interview: Yes  Patient Presentation: Responsive  Reason for Admission (Per Patient): Active Symptoms  Patient Stressors:    Coping Skills:   Write, Music, Art, Substance Abuse  Leisure Interests (2+):  Nature - Hiking, Technical brewer - Therapist, music (Public relations account executive)  Frequency of Recreation/Participation: Monthly  Awareness of Community Resources:     [employer]:     Current Use:    If no, Barriers?:    Expressed Interest in [employer] Information:    [place] of Residence:  Film/video editor  Patient Main Form of Transportation: Walk  Patient Strengths:  N/A  Patient Identified Areas of Improvement:  Stay off drugs  Patient Goal for Hospitalization:  Get back in touch with family  Current SI (including self-harm):  No  Current HI:  No  Current AVH: No  Staff Intervention Plan: Group Attendance, Collaborate with Interdisciplinary Treatment Team  Consent to Intern Participation: N/A  Dajohn Ellender 12/08/2019, 11:06 AM

## 2019-12-08 NOTE — H&P (Signed)
Psychiatric Admission Assessment Adult  Patient Identification: Jill Shepherd MRN:  [id number] Date of Evaluation:  12/08/2019 Chief Complaint:  Bipolar 1 disorder, depressed (HCC) [F31.9] Principal Diagnosis: Bipolar 1 disorder, depressed (HCC) Diagnosis:  Principal Problem:   Bipolar 1 disorder, depressed (HCC) Active Problems:   Anxiety   Opiate abuse, continuous (HCC)   Methamphetamine abuse (HCC)   Amphetamine and psychostimulant-induced psychosis with hallucinations (HCC)  History of Present Illness:  24 year old female who presented to hospital under IVC for acute psychosis, agitation, and verbalization of suicidal ideation to mother. While in the ED, patient was able to sober and consent for voluntary admission to our unit. She reports that she has been off all her psychiatric medications (Seroquel and hydroxyzine) for several months, and has not been following-up with [facility]. She notes that off her medications she becomes very labile. She will go days without sleeping, increase activity, and feel very irritable. She notes that she will have explosive anger outbursts towards her mother and brother, and also have several crying spells throughout the day. She appears manic on exam with pressured speech, mood lability, and flight of ideas. She admits to relapsing on heroin and meth. She was previously working at [employer], but has been unemployed for a month. She states she "does whatever it takes" to get the $160 dollars worth of heroin and meth she uses daily. She states she almost always uses her own personal needles, but has shared needles on occasion. She requests to be tested for Hepatitis, HIV, RPR, gonorrhea and chlamydia. She states that she uses drugs sometimes out of boredom, and also at times to self-medicate. She is aware that it causes her to become psychotic and act out aggressively towards family. She is frustrated with herself for relapsing so many times. She previously  attended [facility] intensive outpatient program. She feels that she may need inpatient detox treatment at this time.   Associated Signs/Symptoms: Depression Symptoms:  depressed mood, feelings of worthlessness/guilt, difficulty concentrating, hopelessness, recurrent thoughts of death, suicidal thoughts with specific plan, disturbed sleep, Duration of Depression Symptoms: No data recorded (Hypo) Manic Symptoms:  Distractibility, Elevated Mood, Flight of Ideas, Impulsivity, Irritable Mood, Labiality of Mood, Anxiety Symptoms:  Excessive Worry, Panic Symptoms, Psychotic Symptoms:  None Duration of Psychotic Symptoms: No data recorded PTSD Symptoms: Negative Total Time spent with patient: 45 minutes  Past Psychiatric History: Past history of substance abuse and  bipolar disorder previously treated with seroquel and hydroxyzine. No previous suicide attempts. Multiple rehab treatments.   Is the patient at risk to self? Yes.    Has the patient been a risk to self in the past 6 months? Yes.    Has the patient been a risk to self within the distant past? Yes.    Is the patient a risk to others? No.  Has the patient been a risk to others in the past 6 months? No.  Has the patient been a risk to others within the distant past? No.   Prior Inpatient Therapy:   Prior Outpatient Therapy:    Alcohol Screening: 1. How often do you have a drink containing alcohol?: Never 2. How many drinks containing alcohol do you have on a typical day when you are drinking?: 1 or 2 3. How often do you have six or more drinks on one occasion?: Never AUDIT-C Score: 0 4. How often during the last year have you found that you were not able to stop drinking once you had started?: Never 5.  How often during the last year have you failed to do what was normally expected from you because of drinking?: Never 6. How often during the last year have you needed a first drink in the morning to get yourself going after a heavy  drinking session?: Never 7. How often during the last year have you had a feeling of guilt of remorse after drinking?: Never 8. How often during the last year have you been unable to remember what happened the night before because you had been drinking?: Never 9. Have you or someone else been injured as a result of your drinking?: No 10. Has a relative or friend or a doctor or another health worker been concerned about your drinking or suggested you cut down?: No Alcohol Use Disorder Identification Test Final Score (AUDIT): 0 Alcohol Brief Interventions/Follow-up: AUDIT Score <7 follow-up not indicated Substance Abuse History in the last 12 months:  Yes.   Consequences of Substance Abuse: Family Consequences:  recently kicked out of mother's home Blackouts:  unable to recall full events leading up to hospitilization  Withdrawal Symptoms:   Cramps Headaches Nausea Tremors Previous Psychotropic Medications: Yes  Psychological Evaluations: No  Past Medical History:  Past Medical History:  Diagnosis Date  . Drug abuse (HCC)   . Headache   . SAB (spontaneous abortion) 01/19/2014   stillborn at 20.4 wks    Past Surgical History:  Procedure Laterality Date  . INCISION AND DRAINAGE ABSCESS Left 05/07/2019   Procedure: INCISION AND DRAINAGE ABSCESS;  Surgeon: Leafy Ro, MD;  Location: [facility] ORS;  Service: General;  Laterality: Left;   Family History:  Family History  Problem Relation Age of Onset  . Hypertension Father   . Hypertension Paternal Grandfather    Family Psychiatric  History: Mother with alcohol and substance abuse issues. Older brother with alcohol use disorder. Younger brother with polysubstance abuse with several prescription pills   Tobacco Screening: Have you used any form of tobacco in the last 30 days? (Cigarettes, Smokeless Tobacco, Cigars, and/or Pipes): No Social History:  Social History   Substance and Sexual Activity  Alcohol Use No  . Alcohol/week: 0.0  standard drinks     Social History   Substance and Sexual Activity  Drug Use Yes  . Types: Other-see comments, Amphetamines, Fentanyl, Heroin   Comment: subutex tx, snorted heroin last on 5/8    Additional Social History:                           Allergies:  No Known Allergies Lab Results:  Results for orders placed or performed during the hospital encounter of 12/06/19 (from the past 48 hour(s))  Respiratory Panel by RT PCR (Flu A&B, Covid) - Nasopharyngeal Swab     Status: None   Collection Time: 12/06/19  4:34 PM   Specimen: Nasopharyngeal Swab  Result Value Ref Range   SARS Coronavirus 2 by RT PCR NEGATIVE NEGATIVE    Comment: (NOTE) SARS-CoV-2 target nucleic acids are NOT DETECTED.  The SARS-CoV-2 RNA is generally detectable in upper respiratoy specimens during the acute phase of infection. The lowest concentration of SARS-CoV-2 viral copies this assay can detect is 131 copies/mL. A negative result does not preclude SARS-Cov-2 infection and should not be used as the sole basis for treatment or other patient management decisions. A negative result may occur with  improper specimen collection/handling, submission of specimen other than nasopharyngeal swab, presence of viral mutation(s) within the areas  targeted by this assay, and inadequate number of viral copies (<131 copies/mL). A negative result must be combined with clinical observations, patient history, and epidemiological information. The expected result is Negative.  Fact Sheet for Patients:  https://www.moore.com/  Fact Sheet for Healthcare Providers:  https://www.young.biz/  This test is no t yet approved or cleared by the [place] FDA and  has been authorized for detection and/or diagnosis of SARS-CoV-2 by FDA under an Emergency Use Authorization (EUA). This EUA will remain  in effect (meaning this test can be used) for the duration of the COVID-19  declaration under Section 564(b)(1) of the Act, 21 U.S.C. section 360bbb-3(b)(1), unless the authorization is terminated or revoked sooner.     Influenza A by PCR NEGATIVE NEGATIVE   Influenza B by PCR NEGATIVE NEGATIVE    Comment: (NOTE) The Xpert Xpress SARS-CoV-2/FLU/RSV assay is intended as an aid in  the diagnosis of influenza from Nasopharyngeal swab specimens and  should not be used as a sole basis for treatment. Nasal washings and  aspirates are unacceptable for Xpert Xpress SARS-CoV-2/FLU/RSV  testing.  Fact Sheet for Patients: https://www.moore.com/  Fact Sheet for Healthcare Providers: https://www.young.biz/  This test is not yet approved or cleared by the [place] FDA and  has been authorized for detection and/or diagnosis of SARS-CoV-2 by  FDA under an Emergency Use Authorization (EUA). This EUA will remain  in effect (meaning this test can be used) for the duration of the  Covid-19 declaration under Section 564(b)(1) of the Act, 21  U.S.C. section 360bbb-3(b)(1), unless the authorization is  terminated or revoked. Performed at [facility], [address] [place], [place] [address]     Blood Alcohol level:  Lab Results  Component Value Date   [facility] <10 12/06/2019   ETH <10 12/01/2019    Metabolic Disorder Labs:  No results found for: HGBA1C, MPG No results found for: PROLACTIN No results found for: CHOL, TRIG, HDL, CHOLHDL, VLDL, LDLCALC  Current Medications: Current Facility-Administered Medications  Medication Dose Route Frequency Provider Last Rate Last Admin  . acetaminophen (TYLENOL) tablet 650 mg  650 mg Oral Q6H PRN Clapacs, John T, MD      . alum & mag hydroxide-simeth (MAALOX/MYLANTA) 200-200-20 MG/5ML suspension 30 mL  30 mL Oral Q4H PRN Clapacs, John T, MD      . hydrOXYzine (ATARAX/VISTARIL) tablet 50 mg  50 mg Oral TID PRN Clapacs, Jackquline Denmark, MD   50 mg at 12/07/19 2103  . influenza vac split  quadrivalent PF (FLUARIX) injection 0.5 mL  0.5 mL Intramuscular Tomorrow-1000 Jesse Sans, MD      . magnesium hydroxide (MILK OF MAGNESIA) suspension 30 mL  30 mL Oral Daily PRN Clapacs, John T, MD      . nicotine (NICODERM CQ - dosed in mg/24 hours) patch 21 mg  21 mg Transdermal Q0600 Clapacs, John T, MD      . QUEtiapine (SEROQUEL) tablet 100 mg  100 mg Oral QHS Clapacs, John T, MD   100 mg at 12/07/19 2103  . QUEtiapine (SEROQUEL) tablet 50 mg  50 mg Oral Once Jesse Sans, MD       PTA Medications: Medications Prior to Admission  Medication Sig Dispense Refill Last Dose  . hydrOXYzine (ATARAX/VISTARIL) 50 MG tablet Take 1 tablet (50 mg total) by mouth 3 (three) times daily as needed (PRN). (Patient not taking: Reported on 12/07/2019) 30 tablet 1   . QUEtiapine (SEROQUEL) 100 MG tablet Take 1 tablet (  100 mg total) by mouth at bedtime. 30 tablet 1     Musculoskeletal: Strength & Muscle Tone: within normal limits Gait & Station: normal Patient leans: N/A  Psychiatric Specialty Exam: Physical Exam Vitals and nursing note reviewed.  Constitutional:      Appearance: Normal appearance.  HENT:     Head: Normocephalic and atraumatic.     Nose: Nose normal.     Mouth/Throat:     Mouth: Mucous membranes are moist.     Pharynx: Oropharynx is clear.  Eyes:     Extraocular Movements: Extraocular movements intact.     Conjunctiva/sclera: Conjunctivae normal.     Pupils: Pupils are equal, round, and reactive to light.  Cardiovascular:     Rate and Rhythm: Tachycardia present.     Pulses: Normal pulses.  Pulmonary:     Effort: Pulmonary effort is normal.     Breath sounds: Normal breath sounds.  Abdominal:     General: Abdomen is flat.     Palpations: Abdomen is soft.  Musculoskeletal:        General: No swelling. Normal range of motion.  Skin:    General: Skin is warm and dry.  Neurological:     General: No focal deficit present.     Mental Status: She is alert and  oriented to person, place, and time.  Psychiatric:        Attention and Perception: She is inattentive.        Mood and Affect: Mood is anxious. Affect is labile and tearful.        Speech: Speech is rapid and pressured.        Behavior: Behavior is agitated.        Thought Content: Thought content includes suicidal ideation.        Cognition and Memory: Memory normal. Cognition is impaired.        Judgment: Judgment is impulsive.     Review of Systems  Constitutional: Positive for appetite change, chills and fatigue.  HENT: Negative for rhinorrhea and sore throat.   Eyes: Negative for photophobia and visual disturbance.  Respiratory: Negative for cough and shortness of breath.   Cardiovascular: Negative for chest pain and palpitations.  Gastrointestinal: Positive for diarrhea and nausea. Negative for constipation and vomiting.  Endocrine: Negative for cold intolerance and heat intolerance.  Genitourinary: Negative for difficulty urinating and dysuria.  Musculoskeletal: Negative for arthralgias and myalgias.  Skin: Negative for rash and wound.  Allergic/Immunologic: Negative for environmental allergies and food allergies.  Neurological: Negative for dizziness and headaches.  Hematological: Negative for adenopathy. Does not bruise/bleed easily.  Psychiatric/Behavioral: Positive for agitation, dysphoric mood, sleep disturbance and suicidal ideas. The patient is nervous/anxious and is hyperactive.     Blood pressure 125/78, pulse (!) 108, temperature (!) 97.4 F (36.3 C), temperature source Oral, resp. rate 16, height 5\' 1"  (1.549 m), weight 105 kg, last menstrual period 12/01/2019, SpO2 100 %, unknown if currently breastfeeding.Body mass index is 43.74 kg/m.  General Appearance: Disheveled  Eye Contact:  Fair  Speech:  Pressured  Volume:  Increased  Mood:  Irritable  Affect:  Labile and Tearful  Thought Process:  Disorganized  Orientation:  Full (Time, Place, and Person)   Thought Content:  Tangential  Suicidal Thoughts:  Yes.  without intent/plan  Homicidal Thoughts:  No  Memory:  Immediate;   Fair Recent;   Poor Remote;   Fair  Judgement:  Impaired  Insight:  Fair  Psychomotor Activity:  Increased  Concentration:  Concentration: Poor and Attention Span: Poor  Recall:  [employer] of Knowledge:  Fair  Language:  Fair  Akathisia:  Negative  Handed:  Right  AIMS (if indicated):     Assets:  Communication Skills Desire for Improvement Resilience Social Support  ADL's:  Intact  Cognition:  Impaired,  Mild  Sleep:  Number of Hours: 6.45       Treatment Plan Summary: Daily contact with patient to assess and evaluate symptoms and progress in treatment and Medication management PLAN OF CARE: Resume home medications of Seroquel 100 mg QHS, Hydroxyzine 50 mg TID PRN for anxiety. Will order hepatitis panel, HIV, RPR, G/C per patient request. At this time patient is hopeful to enter inpatient substance abuse treatment.   Observation Level/Precautions:  15 minute checks  Laboratory:  Hepatitis panel, HIV testing, RPR, G/C  Psychotherapy:    Medications:    Consultations:    Discharge Concerns:    Estimated LOS:  Other:     Physician Treatment Plan for Primary Diagnosis: Bipolar 1 disorder, depressed (HCC) Long Term Goal(s): Improvement in symptoms so as ready for discharge  Short Term Goals: Ability to identify changes in lifestyle to reduce recurrence of condition will improve, Ability to verbalize feelings will improve, Ability to disclose and discuss suicidal ideas, Ability to demonstrate self-control will improve, Ability to identify and develop effective coping behaviors will improve and Compliance with prescribed medications will improve  Physician Treatment Plan for Secondary Diagnosis: Principal Problem:   Bipolar 1 disorder, depressed (HCC) Active Problems:   Anxiety   Opiate abuse, continuous (HCC)   Methamphetamine abuse (HCC)    Amphetamine and psychostimulant-induced psychosis with hallucinations (HCC)  Long Term Goal(s): Improvement in symptoms so as ready for discharge  Short Term Goals: Ability to identify changes in lifestyle to reduce recurrence of condition will improve, Ability to verbalize feelings will improve, Ability to disclose and discuss suicidal ideas, Ability to demonstrate self-control will improve, Ability to identify and develop effective coping behaviors will improve, Compliance with prescribed medications will improve and Ability to identify triggers associated with substance abuse/mental health issues will improve  I certify that inpatient services furnished can reasonably be expected to improve the patient's condition.    Jesse Sans, MD 11/11/20213:31 PM

## 2019-12-09 DIAGNOSIS — F319 Bipolar disorder, unspecified: Secondary | ICD-10-CM | POA: Diagnosis not present

## 2019-12-09 LAB — RPR: RPR Ser Ql: NONREACTIVE

## 2019-12-09 NOTE — [facility] Suicide Risk Assessment (Signed)
[facility] INPATIENT:  Family/Significant Other Suicide Prevention Education  Suicide Prevention Education:  Contact Attempts: Paulina Fusi (sister) ([phone]), has been identified by the patient as the family member/significant other with whom the patient will be residing, and identified as the person(s) who will aid the patient in the event of a mental health crisis.  With written consent from the patient, two attempts were made to provide suicide prevention education, prior to and/or following the patient's discharge.  We were unsuccessful in providing suicide prevention education.  A suicide education pamphlet was given to the patient to share with family/significant other.  Date and time of first attempt: 09 Dec 2019 @ 1311 Date and time of second attempt: CSW will make second attempt. CSW left HIPAA complient VM.   Corky Crafts 12/09/2019, 1:09 PM

## 2019-12-09 NOTE — Tx Team (Addendum)
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[place])   Current Medications:  Current Facility-Administered Medications  Medication Dose Route Frequency Provider Last Rate Last Admin   acetaminophen (TYLENOL) tablet 650 mg  650 mg Oral Q6H PRN Clapacs, Madie Reno, MD       alum & mag hydroxide-simeth (MAALOX/MYLANTA) 200-200-20 MG/5ML suspension 30 mL  30 mL Oral Q4H PRN Clapacs, Madie Reno, MD       hydrOXYzine (ATARAX/VISTARIL) tablet 50 mg  50 mg Oral TID PRN Clapacs, Madie Reno, MD   50 mg at 12/08/19 2119   influenza vac split quadrivalent PF (FLUARIX) injection 0.5 mL  0.5 mL Intramuscular Tomorrow-1000 Salley Scarlet, MD       magnesium hydroxide (MILK OF MAGNESIA) suspension 30 mL  30 mL Oral Daily PRN Clapacs, Madie Reno, MD       nicotine (NICODERM CQ - dosed in mg/24 hours) patch 21 mg  21 mg Transdermal Q0600 Clapacs, John T, MD       QUEtiapine (SEROQUEL) tablet 100 mg  100 mg Oral QHS Clapacs, John T, MD   100 mg at 12/08/19 2119   PTA Medications: Medications Prior to Admission  Medication Sig Dispense Refill Last Dose   hydrOXYzine (ATARAX/VISTARIL) 50 MG tablet Take 1 tablet (50 mg total) by mouth 3 (three) times daily as needed (PRN). (Patient not taking: Reported on 12/07/2019) 30 tablet 1    QUEtiapine (SEROQUEL) 100 MG tablet Take 1 tablet (100 mg total) by mouth at bedtime. 30 tablet 1     Patient Stressors: Marital or family conflict Substance abuse  Patient Strengths: Average or above average intelligence Capable of independent living Communication skills General fund of knowledge  Treatment  Modalities: Medication Management, Group therapy, Case management,  1 to 1 session with clinician, Psychoeducation, Recreational therapy.   Physician Treatment Plan for Primary Diagnosis: Bipolar 1 disorder, depressed ([place]) Long Term Goal(s): Improvement in symptoms so as ready for discharge Improvement in symptoms so as ready for discharge   Short Term Goals: Ability to identify changes in lifestyle to reduce recurrence of condition will improve Ability to verbalize feelings will improve Ability to disclose and discuss suicidal ideas Ability to demonstrate self-control will improve Ability to identify and develop effective coping behaviors will improve Compliance with prescribed medications will improve Ability to identify changes in lifestyle to reduce recurrence of condition will improve Ability to verbalize feelings will improve Ability to disclose and discuss suicidal ideas Ability to demonstrate self-control will improve Ability to identify and develop effective coping behaviors will improve Compliance with prescribed medications will improve Ability to identify triggers associated with substance abuse/mental health issues will improve  Medication Management: Evaluate patient's response, side effects, and tolerance of medication regimen.  Therapeutic Interventions: 1 to 1 sessions, Unit Group sessions and Medication administration.  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[place])  Long Term Goal(s): Improvement in symptoms so as ready for discharge Improvement in symptoms so as ready  for discharge   Short Term Goals: Ability to identify changes in lifestyle to reduce recurrence of condition will improve Ability  to verbalize feelings will improve Ability to disclose and discuss suicidal ideas Ability to demonstrate self-control will improve Ability to identify and develop effective coping behaviors will improve Compliance with prescribed medications will improve Ability to identify changes in lifestyle to reduce recurrence of condition will improve Ability to verbalize feelings will improve Ability to disclose and discuss suicidal ideas Ability to demonstrate self-control will improve Ability to identify and develop effective coping behaviors will improve Compliance with prescribed medications will improve Ability to identify triggers associated with substance abuse/mental health issues will improve     Medication Management: Evaluate patient's response, side effects, and tolerance of medication regimen.  Therapeutic Interventions: 1 to 1 sessions, Unit Group sessions and Medication administration.  Evaluation of Outcomes: Not Met   RN Treatment Plan for Primary Diagnosis: Bipolar 1 disorder, depressed ([place]) Long Term Goal(s): Knowledge of disease and therapeutic regimen to maintain health will improve  Short Term Goals: Ability to verbalize frustration and anger appropriately will improve, Ability to participate in decision making will improve and Ability to verbalize feelings will improve  Medication Management: RN will administer medications as ordered by provider, will assess and evaluate patient's response and provide education to patient for prescribed medication. RN will report any adverse and/or side effects to prescribing provider.  Therapeutic Interventions: 1 on 1 counseling sessions, Psychoeducation, Medication administration, Evaluate responses to treatment, Monitor vital signs and CBGs as ordered, Perform/monitor CIWA, COWS, AIMS and Fall Risk screenings as ordered, Perform wound care treatments as ordered.  Evaluation of Outcomes: Not Met   LCSW Treatment Plan for Primary  Diagnosis: Bipolar 1 disorder, depressed ([place]) Long Term Goal(s): Safe transition to appropriate next level of care at discharge, Engage patient in therapeutic group addressing interpersonal concerns.  Short Term Goals: Engage patient in aftercare planning with referrals and resources, Increase social support, Increase ability to appropriately verbalize feelings, Increase emotional regulation, Facilitate patient progression through stages of change regarding substance use diagnoses and concerns and Identify triggers associated with mental health/substance abuse issues  Therapeutic Interventions: Assess for all discharge needs, 1 to 1 time with Social worker, Explore available resources and support systems, Assess for adequacy in community support network, Educate family and significant other(s) on suicide prevention, Complete Psychosocial Assessment, Interpersonal group therapy.  Evaluation of Outcomes: Not Met   Progress in Treatment: Attending groups: No. Participating in groups: No. Taking medication as prescribed: Yes. Toleration medication: Yes. Family/Significant other contact made: No, will contact:  CSW will contact patient's sister once provided consent Patient understands diagnosis: Yes. Discussing patient identified problems/goals with staff: Yes. Medical problems stabilized or resolved: Yes. Denies suicidal/homicidal ideation: Yes. Issues/concerns per patient self-inventory: No. Other: None  New problem(s) identified: No, Describe:  None  New Short Term/Long Term Goal(s): Detox, emotional regulation, discharge planning, attend group sessions.  Patient Goals:  "get out of here"   Discharge Plan or Barriers: Patient unsure of post discharge plans, CSW will follow-up. Provided material to inpatient substance use facilities.   Reason for Continuation of Hospitalization: Anxiety Depression Medication stabilization Withdrawal symptoms   Estimated Length of Stay: 1-7 Days    Recreational Therapy: Patient Stressors: N/A Patient Goal: Patient will engage in groups without prompting or encouragement from LRT x3 group sessions within 5 recreation therapy group sessions.   Attendees: Patient: Malia Corsi 12/09/2019 12:33 PM  Physician: Selina Cooley, MD 12/09/2019 12:33 PM  Nursing: Collier Bullock, RN 12/09/2019 12:33 PM  RN Care Manager: 12/09/2019 12:33 PM  Social Worker: Paulla Dolly, MSW, [place] 12/09/2019 12:33 PM  Recreational Therapist: Devin Going, LRT 12/09/2019 12:33 PM  Other: Assunta Curtis, MSW, LCSW 12/09/2019 12:33 PM  Other:  12/09/2019 12:33 PM  Other: 12/09/2019 12:33 PM    Scribe for Treatment Team: Durenda Hurt, [place] 12/09/2019 12:33 PM

## 2019-12-09 NOTE — [facility] Counselor (Signed)
CSW faxed referrals for [facility], [facility] and [facility].  CSW received confirmation that the faxes were successful.  Penni Homans, MSW, LCSW 12/09/2019 3:36 PM

## 2019-12-09 NOTE — Progress Notes (Addendum)
D- Patient alert and oriented. Affect/mood is pleasant, calm and cooperative. Pt denies SI, HI, AVH, and pain. Pt sates that she feels "a little better".   A- Scheduled medications administered to patient, per MD orders. Support and encouragement provided.  Routine safety checks conducted every 15 minutes.  Patient informed to notify staff with problems or concerns.  R- No adverse drug reactions noted. Patient contracts for safety at this time. Patient compliant with medications and treatment plan. Patient receptive, calm, and cooperative. Patient interacts well with others on the unit.  Patient remains safe at this time.  Torrie Mayers RN
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
## 2019-12-09 NOTE — Progress Notes (Signed)
[facility] MD Progress Note  12/09/2019 1:52 PM Jill Shepherd  MRN:  [id number]   Subjective:  24 year old female who presented to hospital under IVC for acute psychosis, agitation, and verbalization of suicidal ideation to mother. Patient seen during treatment team and again one-on-one. She is very irritable and anxious today, and still exhibits mood lability. She states her goals are to get her mood stabilized, and stay sober. Informed that her HCV antibodies were positive, and that we would be follow-up with additional blood tests. Today she feels her anxiety and depression are a 10/10, but denies thoughts of suicide. She also denies homicidal ideations, visual hallucinations, and auditory hallucinations. She appears to still be going through acute withdrawals.   Principal Problem: Bipolar 1 disorder, depressed (HCC) Diagnosis: Principal Problem:   Bipolar 1 disorder, depressed (HCC) Active Problems:   Anxiety   Opiate abuse, continuous (HCC)   Methamphetamine abuse (HCC)   Amphetamine and psychostimulant-induced psychosis with hallucinations (HCC)  Total Time spent with patient: 30 minutes  Past Psychiatric History: Past history of substance abuse and  bipolar disorder previously treated with seroquel and hydroxyzine. No previous suicide attempts. Multiple rehab treatments.   Past Medical History:  Past Medical History:  Diagnosis Date  . Drug abuse (HCC)   . Headache   . SAB (spontaneous abortion) 01/19/2014   stillborn at 20.4 wks    Past Surgical History:  Procedure Laterality Date  . INCISION AND DRAINAGE ABSCESS Left 05/07/2019   Procedure: INCISION AND DRAINAGE ABSCESS;  Surgeon: Leafy Ro, MD;  Location: [facility] ORS;  Service: General;  Laterality: Left;   Family History:  Family History  Problem Relation Age of Onset  . Hypertension Father   . Hypertension Paternal Grandfather    Family Psychiatric  History: Mother with alcohol and substance abuse issues. Older brother  with alcohol use disorder. Younger brother with polysubstance abuse with several prescription pills  Social History:  Social History   Substance and Sexual Activity  Alcohol Use No  . Alcohol/week: 0.0 standard drinks     Social History   Substance and Sexual Activity  Drug Use Yes  . Types: Other-see comments, Amphetamines, Fentanyl, Heroin   Comment: subutex tx, snorted heroin last on 5/8    Social History   Socioeconomic History  . Marital status: Legally Separated    Spouse name: Not on file  . Number of children: 1  . Years of education: Not on file  . Highest education level: Not on file  Occupational History    Employer: [employer]  Tobacco Use  . Smoking status: Never Smoker  . Smokeless tobacco: Never Used  Vaping Use  . Vaping Use: Never used  Substance and Sexual Activity  . Alcohol use: No    Alcohol/week: 0.0 standard drinks  . Drug use: Yes    Types: Other-see comments, Amphetamines, Fentanyl, Heroin    Comment: subutex tx, snorted heroin last on 5/8  . Sexual activity: Yes    Partners: Male    Birth control/protection: None  Other Topics Concern  . Not on file  Social History Narrative  . Not on file   Social Determinants of Health   Financial Resource Strain:   . Difficulty of Paying Living Expenses: Not on file  Food Insecurity:   . Worried About Programme researcher, broadcasting/film/video in the Last Year: Not on file  . Ran Out of Food in the Last Year: Not on file  Transportation Needs:   . Lack of  Transportation (Medical): Not on file  . Lack of Transportation (Non-Medical): Not on file  Physical Activity:   . Days of Exercise per Week: Not on file  . Minutes of Exercise per Session: Not on file  Stress:   . Feeling of Stress : Not on file  Social Connections:   . Frequency of Communication with Friends and Family: Not on file  . Frequency of Social Gatherings with Friends and Family: Not on file  . Attends Religious Services: Not on file  . Active Member of  Clubs or Organizations: Not on file  . Attends Banker Meetings: Not on file  . Marital Status: Not on file   Additional Social History:                         Sleep: Poor  Appetite:  Poor  Current Medications: Current Facility-Administered Medications  Medication Dose Route Frequency Provider Last Rate Last Admin  . acetaminophen (TYLENOL) tablet 650 mg  650 mg Oral Q6H PRN Clapacs, John T, MD      . alum & mag hydroxide-simeth (MAALOX/MYLANTA) 200-200-20 MG/5ML suspension 30 mL  30 mL Oral Q4H PRN Clapacs, John T, MD      . hydrOXYzine (ATARAX/VISTARIL) tablet 50 mg  50 mg Oral TID PRN Clapacs, Jackquline Denmark, MD   50 mg at 12/08/19 2119  . influenza vac split quadrivalent PF (FLUARIX) injection 0.5 mL  0.5 mL Intramuscular Tomorrow-1000 Jesse Sans, MD      . magnesium hydroxide (MILK OF MAGNESIA) suspension 30 mL  30 mL Oral Daily PRN Clapacs, John T, MD      . nicotine (NICODERM CQ - dosed in mg/24 hours) patch 21 mg  21 mg Transdermal Q0600 Clapacs, John T, MD      . QUEtiapine (SEROQUEL) tablet 100 mg  100 mg Oral QHS Clapacs, Jackquline Denmark, MD   100 mg at 12/08/19 2119    Lab Results:  Results for orders placed or performed during the hospital encounter of 12/07/19 (from the past 48 hour(s))  RPR     Status: None   Collection Time: 12/08/19 11:00 AM  Result Value Ref Range   RPR Ser Ql NON REACTIVE NON REACTIVE    Comment: Performed at [facility], [address] [place], [place] [address]  HIV Antibody (routine testing w rflx)     Status: None   Collection Time: 12/08/19 11:00 AM  Result Value Ref Range   HIV Screen 4th Generation wRfx Non Reactive Non Reactive    Comment: Performed at [facility], [address], [place], [place] [address]  Hepatitis panel, acute     Status: Abnormal   Collection Time: 12/08/19 11:00 AM  Result Value Ref Range   Hepatitis B Surface Ag NON REACTIVE NON REACTIVE   HCV Ab Reactive (A) NON REACTIVE     Comment: (NOTE) The CDC recommends that a Reactive HCV antibody result be followed up  with a HCV Nucleic Acid Amplification test.     Hep A IgM NON REACTIVE NON REACTIVE   Hep B C IgM NON REACTIVE NON REACTIVE    Comment: Performed at [facility], [address] [place], [place] [address]  Chlamydia/NGC rt PCR [facility] only)     Status: None   Collection Time: 12/08/19  4:50 PM   Specimen: Urine  Result Value Ref Range   Specimen source GC/Chlam URINE, RANDOM  Chlamydia Tr NOT DETECTED NOT DETECTED   N gonorrhoeae NOT DETECTED NOT DETECTED    Comment: (NOTE) This CT/NG assay has not been evaluated in patients with a history of  hysterectomy. Performed at [facility], [address] [place], [place] [address]     Blood Alcohol level:  Lab Results  Component Value Date   [facility] <10 12/06/2019   ETH <10 12/01/2019    Metabolic Disorder Labs: No results found for: HGBA1C, MPG No results found for: PROLACTIN No results found for: CHOL, TRIG, HDL, CHOLHDL, VLDL, LDLCALC  Physical Findings: AIMS: Facial and Oral Movements Muscles of Facial Expression: None, normal Lips and Perioral Area: None, normal Jaw: None, normal Tongue: None, normal,Extremity Movements Upper (arms, wrists, hands, fingers): None, normal Lower (legs, knees, ankles, toes): None, normal, Trunk Movements Neck, shoulders, hips: None, normal, Overall Severity Severity of abnormal movements (highest score from questions above): None, normal Incapacitation due to abnormal movements: None, normal Patient's awareness of abnormal movements (rate only patient's report): No Awareness, Dental Status Current problems with teeth and/or dentures?: No Does patient usually wear dentures?: No  CIWA:    COWS:     Musculoskeletal: Strength & Muscle Tone: within normal limits Gait & Station: normal Patient leans: N/A  Psychiatric Specialty Exam: Physical Exam Vitals and nursing note reviewed.   Constitutional:      General: She is in acute distress.  HENT:     Head: Normocephalic and atraumatic.     Right Ear: External ear normal.     Left Ear: External ear normal.     Nose: Nose normal.     Mouth/Throat:     Mouth: Mucous membranes are moist.     Pharynx: Oropharynx is clear.  Eyes:     Extraocular Movements: Extraocular movements intact.     Conjunctiva/sclera: Conjunctivae normal.     Pupils: Pupils are equal, round, and reactive to light.  Cardiovascular:     Rate and Rhythm: Tachycardia present.     Pulses: Normal pulses.  Pulmonary:     Effort: Pulmonary effort is normal.     Breath sounds: Normal breath sounds.  Abdominal:     General: Abdomen is flat.     Palpations: Abdomen is soft.  Musculoskeletal:        General: No swelling. Normal range of motion.     Cervical back: Normal range of motion and neck supple.  Skin:    General: Skin is warm and dry.  Neurological:     General: No focal deficit present.     Mental Status: She is alert and oriented to person, place, and time.  Psychiatric:        Attention and Perception: Perception normal. She is inattentive.        Mood and Affect: Affect is labile and angry.        Speech: She is noncommunicative.        Behavior: Behavior is agitated.        Thought Content: Thought content includes suicidal ideation.        Cognition and Memory: Memory normal. Cognition is impaired.        Judgment: Judgment is impulsive.     Review of Systems  Constitutional: Positive for appetite change and fatigue.  HENT: Negative for rhinorrhea and sore throat.   Eyes: Negative for photophobia and visual disturbance.  Respiratory: Negative for cough and shortness of breath.   Cardiovascular: Negative for chest pain and palpitations.  Gastrointestinal: Positive for  diarrhea and nausea. Negative for constipation and vomiting.  Endocrine: Negative for cold intolerance and heat intolerance.  Genitourinary: Negative for  difficulty urinating and dysuria.  Musculoskeletal: Negative for arthralgias and myalgias.  Allergic/Immunologic: Negative for environmental allergies and food allergies.  Neurological: Negative for dizziness and headaches.  Hematological: Negative for adenopathy. Does not bruise/bleed easily.  Psychiatric/Behavioral: Positive for agitation, dysphoric mood, sleep disturbance and suicidal ideas. The patient is nervous/anxious and is hyperactive.     Blood pressure 111/80, pulse (!) 102, temperature 97.8 F (36.6 C), temperature source Oral, resp. rate 16, height 5\' 1"  (1.549 m), weight 105 kg, last menstrual period 12/01/2019, SpO2 99 %, unknown if currently breastfeeding.Body mass index is 43.74 kg/m.  General Appearance: Disheveled  Eye Contact:  Poor  Speech:  Clear and Coherent  Volume:  Normal  Mood:  Irritable  Affect:  Labile  Thought Process:  Coherent  Orientation:  Full (Time, Place, and Person)  Thought Content:  Logical  Suicidal Thoughts:  No  Homicidal Thoughts:  No  Memory:  Immediate;   Fair Recent;   Fair Remote;   Fair  Judgement:  Intact  Insight:  Fair  Psychomotor Activity:  Normal  Concentration:  Concentration: Poor and Attention Span: Poor  Recall:  [employer]Fair  Fund of Knowledge:  Negative  Language:  Fair  Akathisia:  Negative  Handed:  Right  AIMS (if indicated):     Assets:  Communication Skills Desire for Improvement Resilience Social Support  ADL's:  Intact  Cognition:  WNL  Sleep:  Number of Hours: 8     Treatment Plan Summary: Daily contact with patient to assess and evaluate symptoms and progress in treatment, Medication management and Plan continue medications as above. Hep C antibody positive, will order HCV RNA quant  Jesse SansMegan M Charnika Herbst, MD 12/09/2019, 1:52 PM

## 2019-12-09 NOTE — Progress Notes (Signed)
Patient alert and oriented x 4, affect is flat thoughts are organized and coherent no distress noted, she denies SI/HI/AVH interacting appropriately with peers and staff, she is complaint with medication regimen, 15 minutes safety checks maintained will continue to monitor closely.

## 2019-12-09 NOTE — Progress Notes (Signed)
Pt rates depression, anxiety and hopelessness all at 10/10. Pt denies SI, HI and AVH. Pt was educated on care plan and verbalizes understanding.Pt encouraged to attend groups.  Torrie Mayers RN

## 2019-12-10 DIAGNOSIS — F319 Bipolar disorder, unspecified: Secondary | ICD-10-CM | POA: Diagnosis not present

## 2019-12-10 LAB — HCV RNA QUANT
HCV Quantitative Log: 5.874 log10 IU/mL (ref >1.70)
HCV Quantitative: 749000 IU/mL (ref >50)

## 2019-12-10 NOTE — [facility] Suicide Risk Assessment (Addendum)
[facility] INPATIENT:  Family/Significant Other Suicide Prevention Education  Suicide Prevention Education:  Contact Attempts: Paulina Fusi [phone], (Sister) has been identified by the patient as the family member/significant other with whom the patient will be residing, and identified as the person(s) who will aid the patient in the event of a mental health crisis.  With written consent from the patient, two attempts were made to provide suicide prevention education, prior to and/or following the patient's discharge.  We were unsuccessful in providing suicide prevention education.  A suicide education pamphlet was given to the patient to share with family/significant other.  Date and time of first attempt:12/10/19/10:45 AM and 10:52 AM CSW left a VM requesting a call back  Date and time of second attempt: 12/11/19 4:28 PM   Carollee Herter  Hanley Woerner 12/10/2019, 10:58 AM

## 2019-12-10 NOTE — [facility] Group Notes (Signed)
  [facility] LCSW Group Therapy Note  Date/Time:  12/10/2019 1:07 PM- 1:48 PM   Type of Therapy and Topic:  Group Therapy:  Feelings About Hospitalization  Participation Level:  Active   Description of Group This process group involved patients discussing their feelings related to being hospitalized, as well as the benefits they see to being in the hospital.  These feelings and benefits were itemized.  The group then brainstormed specific ways in which they could seek those same benefits when they discharge and return home.  Therapeutic Goals 1. Patient will identify and describe positive and negative feelings related to hospitalization 2. Patient will verbalize benefits of hospitalization to themselves personally 3. Patients will brainstorm together ways they can obtain similar benefits in the outpatient setting, identify barriers to wellness and possible solutions  Summary of Patient Progress: Patient came into group late. Patient stated that the hospital is better than jail. Patient talked about having a place to sleep and not being out on the streets. Patient stated a negative for her is waking up in the hospital confused. Patient stated she knows why she is here but it can be confusing at time. Patient left group due to having a headache.    Therapeutic Modalities Cognitive Behavioral Therapy Motivational Interviewing    Susa Simmonds, [place] 12/10/2019  3:01 PM

## 2019-12-10 NOTE — Progress Notes (Signed)
Patient remained in her room most of the shift. Came out for medications. Asked for an extra nicotine patch saying she lost hers. Told her that next one was due at [facility] and she accepted this. Denies SI, HI and AVH. Pleasant and cooperative.

## 2019-12-10 NOTE — Progress Notes (Signed)
Pt is alert and oriented to person, place, time and situation. Pt is calm, cooperative, pleasant, makes fair eye contact, is medication compliant, reports she slept well, denies feelings of depression, denies suicidal and homicidal ideation. Pt reports anxiety rating it 6/10 on a 0-10 scale, 10 being worst, requested and is given PRN medication for anxiety. Pt spends time in the dayroom, attended group, but left early due to c/o headache, requested and was given PRN medication for that pain which was effective. Will continue to monitor pt per Q15 minute face checks and monitor for safety and progress.

## 2019-12-10 NOTE — Progress Notes (Signed)
[facility] MD Progress Note  12/10/2019 10:16 AM Jill Shepherd  MRN:  [id number]  Principal Problem: Bipolar 1 disorder, depressed (HCC) Diagnosis: Principal Problem:   Bipolar 1 disorder, depressed (HCC) Active Problems:   Anxiety   Opiate abuse, continuous (HCC)   Methamphetamine abuse (HCC)   Amphetamine and psychostimulant-induced psychosis with hallucinations [facility])  Mr. Deerman is a  24y.o. female who was admitted to the [facility] unit for psychosis, agitation, suicidal ideations.  Interval History Patient was seen today for re-evaluation.  Nursing reports no events overnight. The patient has no issues with performing ADLs.  Patient has been medication compliant.    Subjective:  On assessment patient reports "I feel better". She denies any current physical complaints, no withdrawal symptoms. She reports she slept well last night. She thinks current dose of Seroquel works well for both, mood stabilization and sleep. She reports mood improvement - states she is calm, less depressed, less anxious, denies suicidal or homicidal thoughts. She denies any hallucinations today. The patient reports no side effects from medications.    Labs: no new results for review.  Total Time spent with patient: 15 minutes  Past Psychiatric History: seed H&P  Past Medical History:  Past Medical History:  Diagnosis Date  . Drug abuse (HCC)   . Headache   . SAB (spontaneous abortion) 01/19/2014   stillborn at 20.4 wks    Past Surgical History:  Procedure Laterality Date  . INCISION AND DRAINAGE ABSCESS Left 05/07/2019   Procedure: INCISION AND DRAINAGE ABSCESS;  Surgeon: Leafy Ro, MD;  Location: [facility] ORS;  Service: General;  Laterality: Left;   Family History:  Family History  Problem Relation Age of Onset  . Hypertension Father   . Hypertension Paternal Grandfather    Family Psychiatric  History: see H&P  Social History:  Social History   Substance and Sexual Activity  Alcohol Use No  .  Alcohol/week: 0.0 standard drinks     Social History   Substance and Sexual Activity  Drug Use Yes  . Types: Other-see comments, Amphetamines, Fentanyl, Heroin   Comment: subutex tx, snorted heroin last on 5/8    Social History   Socioeconomic History  . Marital status: Legally Separated    Spouse name: Not on file  . Number of children: 1  . Years of education: Not on file  . Highest education level: Not on file  Occupational History    Employer: [employer]  Tobacco Use  . Smoking status: Never Smoker  . Smokeless tobacco: Never Used  Vaping Use  . Vaping Use: Never used  Substance and Sexual Activity  . Alcohol use: No    Alcohol/week: 0.0 standard drinks  . Drug use: Yes    Types: Other-see comments, Amphetamines, Fentanyl, Heroin    Comment: subutex tx, snorted heroin last on 5/8  . Sexual activity: Yes    Partners: Male    Birth control/protection: None  Other Topics Concern  . Not on file  Social History Narrative  . Not on file   Social Determinants of Health   Financial Resource Strain:   . Difficulty of Paying Living Expenses: Not on file  Food Insecurity:   . Worried About Programme researcher, broadcasting/film/video in the Last Year: Not on file  . Ran Out of Food in the Last Year: Not on file  Transportation Needs:   . Lack of Transportation (Medical): Not on file  . Lack of Transportation (Non-Medical): Not on file  Physical Activity:   .  Days of Exercise per Week: Not on file  . Minutes of Exercise per Session: Not on file  Stress:   . Feeling of Stress : Not on file  Social Connections:   . Frequency of Communication with Friends and Family: Not on file  . Frequency of Social Gatherings with Friends and Family: Not on file  . Attends Religious Services: Not on file  . Active Member of Clubs or Organizations: Not on file  . Attends BankerClub or Organization Meetings: Not on file  . Marital Status: Not on file   Additional Social History:                          Sleep: Good  Appetite:  Good  Current Medications: Current Facility-Administered Medications  Medication Dose Route Frequency Provider Last Rate Last Admin  . acetaminophen (TYLENOL) tablet 650 mg  650 mg Oral Q6H PRN Clapacs, John T, MD      . alum & mag hydroxide-simeth (MAALOX/MYLANTA) 200-200-20 MG/5ML suspension 30 mL  30 mL Oral Q4H PRN Clapacs, John T, MD      . hydrOXYzine (ATARAX/VISTARIL) tablet 50 mg  50 mg Oral TID PRN Clapacs, Jackquline DenmarkJohn T, MD   50 mg at 12/10/19 1000  . influenza vac split quadrivalent PF (FLUARIX) injection 0.5 mL  0.5 mL Intramuscular Tomorrow-1000 Jesse SansFreeman, Megan M, MD      . magnesium hydroxide (MILK OF MAGNESIA) suspension 30 mL  30 mL Oral Daily PRN Clapacs, John T, MD      . nicotine (NICODERM CQ - dosed in mg/24 hours) patch 21 mg  21 mg Transdermal Q0600 Clapacs, Jackquline DenmarkJohn T, MD   21 mg at 12/10/19 1000  . QUEtiapine (SEROQUEL) tablet 100 mg  100 mg Oral QHS Clapacs, Jackquline DenmarkJohn T, MD   100 mg at 12/09/19 2131    Lab Results:  Results for orders placed or performed during the hospital encounter of 12/07/19 (from the past 48 hour(s))  RPR     Status: None   Collection Time: 12/08/19 11:00 AM  Result Value Ref Range   RPR Ser Ql NON REACTIVE NON REACTIVE    Comment: Performed at [facility], [address] [place], [place] [address]  HIV Antibody (routine testing w rflx)     Status: None   Collection Time: 12/08/19 11:00 AM  Result Value Ref Range   HIV Screen 4th Generation wRfx Non Reactive Non Reactive    Comment: Performed at [facility], [address] [place], [place] [address]  Hepatitis panel, acute     Status: Abnormal   Collection Time: 12/08/19 11:00 AM  Result Value Ref Range   Hepatitis B Surface Ag NON REACTIVE NON REACTIVE   HCV Ab Reactive (A) NON REACTIVE    Comment: (NOTE) The CDC recommends that a Reactive HCV antibody result be followed up  with a HCV Nucleic Acid Amplification test.     Hep A IgM NON REACTIVE NON  REACTIVE   Hep B C IgM NON REACTIVE NON REACTIVE    Comment: Performed at [facility], [address] [place], [place] [address]  Chlamydia/NGC rt PCR [place]([facility] only)     Status: None   Collection Time: 12/08/19  4:50 PM   Specimen: Urine  Result Value Ref Range   Specimen source GC/Chlam URINE, RANDOM    Chlamydia Tr NOT DETECTED NOT DETECTED   N gonorrhoeae NOT DETECTED NOT DETECTED  Comment: (NOTE) This CT/NG assay has not been evaluated in patients with a history of  hysterectomy. Performed at [facility], [address] [place], [place] [address]     Blood Alcohol level:  Lab Results  Component Value Date   [facility] <10 12/06/2019   ETH <10 12/01/2019    Metabolic Disorder Labs: No results found for: HGBA1C, MPG No results found for: PROLACTIN No results found for: CHOL, TRIG, HDL, CHOLHDL, VLDL, LDLCALC  Physical Findings: AIMS: Facial and Oral Movements Muscles of Facial Expression: None, normal Lips and Perioral Area: None, normal Jaw: None, normal Tongue: None, normal,Extremity Movements Upper (arms, wrists, hands, fingers): None, normal Lower (legs, knees, ankles, toes): None, normal, Trunk Movements Neck, shoulders, hips: None, normal, Overall Severity Severity of abnormal movements (highest score from questions above): None, normal Incapacitation due to abnormal movements: None, normal Patient's awareness of abnormal movements (rate only patient's report): No Awareness, Dental Status Current problems with teeth and/or dentures?: No Does patient usually wear dentures?: No  CIWA:    COWS:     Musculoskeletal: Strength & Muscle Tone: within normal limits Gait & Station: normal Patient leans: N/A  Psychiatric Specialty Exam: Physical Exam  Review of Systems  Blood pressure 108/77, pulse 88, temperature 98 F (36.7 C), temperature source Oral, resp. rate 18, height 5\' 1"  (1.549 m), weight 105 kg, last menstrual period 12/01/2019, SpO2 99 %,  unknown if currently breastfeeding.Body mass index is 43.74 kg/m.  General Appearance: Casual  Eye Contact:  Good  Speech:  Clear and Coherent and Normal Rate  Volume:  Normal  Mood:  Euthymic  Affect:  Appropriate and Congruent  Thought Process:  Coherent and Goal Directed  Orientation:  Full (Time, Place, and Person)  Thought Content:  Logical  Suicidal Thoughts:  No  Homicidal Thoughts:  No  Memory:  Immediate;   Fair Recent;   Fair Remote;   NA  Judgement:  Fair  Insight:  Fair  Psychomotor Activity:  Normal  Concentration:  Concentration: Fair and Attention Span: Fair  Recall:  13/04/2019 of Knowledge:  Fair  Language:  Fair  Akathisia:  No  Handed:  Right  AIMS (if indicated):     Assets:  Communication Skills Desire for Improvement Resilience Social Support  ADL's:  Intact  Cognition:  WNL  Sleep:  Number of Hours: 8.5     Treatment Plan Summary: Daily contact with patient to assess and evaluate symptoms and progress in treatment and Medication management   Patient is a 24 year old female with the above-stated past psychiatric history who is seen in follow-up.  Chart reviewed. Patient discussed with nursing. Patient reports mood improvement today likely in settings of both - after psychotropic medications were restarted and due to not having symptoms of acute opioid withdrawal currently. Denies unsafe thoughts, denies symptoms of psychosis, reports improved sleep. No side effects from medications reported. The current med combination will be continued without changes today.   Plan: -continue inpatient psych admission; 15-minute checks; daily contact with patient to assess and evaluate symptoms and progress in treatment; psychoeducation.  -continue scheduled medications: . influenza vac split quadrivalent PF  0.5 mL Intramuscular Tomorrow-1000  . nicotine  21 mg Transdermal Q0600  . QUEtiapine  100 mg Oral QHS    -continue PRN medications.  acetaminophen,  alum & mag hydroxide-simeth, hydrOXYzine, magnesium hydroxide  -Pertinent Labs: HCV RNA quant ordered yesterday 11/12.  -EKG: on 11/4 showed Qtc 472 with normal sinus rhythm    -  Consults: No new consults placed since yesterday    -Disposition: Estimated duration of hospitalization: early-midweek next week. All necessary aftercare will be arranged prior to discharge Likely d/c home with outpatient psych follow-up vs referral to rehab.  -  I certify that the patient does need, on a daily basis, active treatment furnished directly by or requiring the supervision of inpatient psychiatric facility personnel.   Thalia Party, MD 12/10/2019, 10:16 AM

## 2019-12-10 NOTE — Plan of Care (Signed)
  Problem: Education: Goal: Knowledge of [facility] Education information/materials will improve Outcome: Progressing Goal: Emotional status will improve Outcome: Progressing Goal: Mental status will improve Outcome: Progressing Goal: Verbalization of understanding the information provided will improve Outcome: Progressing   Problem: Activity: Goal: Interest or engagement in activities will improve Outcome: Progressing Goal: Sleeping patterns will improve Outcome: Progressing   Problem: Coping: Goal: Ability to verbalize frustrations and anger appropriately will improve Outcome: Progressing Goal: Ability to demonstrate self-control will improve Outcome: Progressing   Problem: Health Behavior/Discharge Planning: Goal: Identification of resources available to assist in meeting health care needs will improve Outcome: Progressing Goal: Compliance with treatment plan for underlying cause of condition will improve Outcome: Progressing   Problem: Physical Regulation: Goal: Ability to maintain clinical measurements within normal limits will improve Outcome: Progressing   Problem: Safety: Goal: Periods of time without injury will increase Outcome: Progressing   Problem: Education: Goal: Knowledge of disease or condition will improve Outcome: Progressing Goal: Understanding of discharge needs will improve Outcome: Progressing   Problem: Health Behavior/Discharge Planning: Goal: Ability to identify changes in lifestyle to reduce recurrence of condition will improve Outcome: Progressing Goal: Identification of resources available to assist in meeting health care needs will improve Outcome: Progressing   Problem: Physical Regulation: Goal: Complications related to the disease process, condition or treatment will be avoided or minimized Outcome: Progressing   Problem: Safety: Goal: Ability to remain free from injury will improve Outcome: Progressing   

## 2019-12-11 DIAGNOSIS — F319 Bipolar disorder, unspecified: Secondary | ICD-10-CM | POA: Diagnosis not present

## 2019-12-11 NOTE — Progress Notes (Signed)
Patient presents in a pleasant mood. She denies SI  HI  AV depression and pain at this encounter.  She does endorse having anxiety rating 4/10 at this encounter.  She is med compliant and tolerated her medication without incident.  She is active on the unit and gets along well with other female peers.  She is safe on the unit with 15 minute safety checks and encouraged to contact staff with any concerns.   Jill Butler-Nicholson, LPN

## 2019-12-11 NOTE — Progress Notes (Addendum)
Pt is alert and oriented to person, place, time and situation. Pt is calm, cooperative, pleasant, denies suicidal and homicidal ideation, denies hallucination, denies feelings of depression, reports anxiety rating it 6/10 on a 0-10 pain scale, 10 being worst. Pt c/o headache and requested and is given PRN medication for pain, then went to sleep in her bed for the afternoon. Pt smiles on contact, denies any withdrawal symptoms, reports she did not sleep well last night due to her mattress being uncomfortable. Will continue to monitor pt per Q15 minute face checks and monitor for safety and progress.

## 2019-12-11 NOTE — Progress Notes (Signed)
[facility] MD Progress Note  12/11/2019 10:46 AM Jill Shepherd  MRN:  [id number]  Principal Problem: Bipolar 1 disorder, depressed (HCC) Diagnosis: Principal Problem:   Bipolar 1 disorder, depressed (HCC) Active Problems:   Anxiety   Opiate abuse, continuous (HCC)   Methamphetamine abuse (HCC)   Amphetamine and psychostimulant-induced psychosis with hallucinations [facility])  Jill Shepherd is a  24y.o. female who was admitted to the [facility] unit for psychosis, agitation, suicidal ideations.  Interval History Patient was seen today for re-evaluation.  Nursing reports no events overnight. The patient has no issues with performing ADLs.  Patient has been medication compliant.    Subjective:  On assessment patient reports "I feel good". She denies any current physical complaints. She reports mild to no opioid withdrawal symptoms - mild anxiety. She reports she slept well last night. She reports mood improvement - states she is calm, not depressed anymore, less anxious, denies suicidal or homicidal thoughts. She denies any hallucinations today. The patient reports no side effects from medications. She is interested in referral to outpatient substance-abuse treatment program.   Labs: no new results for review.  Total Time spent with patient: 15 minutes  Past Psychiatric History: seed H&P  Past Medical History:  Past Medical History:  Diagnosis Date  . Drug abuse (HCC)   . Headache   . SAB (spontaneous abortion) 01/19/2014   stillborn at 20.4 wks    Past Surgical History:  Procedure Laterality Date  . INCISION AND DRAINAGE ABSCESS Left 05/07/2019   Procedure: INCISION AND DRAINAGE ABSCESS;  Surgeon: Leafy Ro, MD;  Location: [facility] ORS;  Service: General;  Laterality: Left;   Family History:  Family History  Problem Relation Age of Onset  . Hypertension Father   . Hypertension Paternal Grandfather    Family Psychiatric  History: see H&P  Social History:  Social History   Substance and  Sexual Activity  Alcohol Use No  . Alcohol/week: 0.0 standard drinks     Social History   Substance and Sexual Activity  Drug Use Yes  . Types: Other-see comments, Amphetamines, Fentanyl, Heroin   Comment: subutex tx, snorted heroin last on 5/8    Social History   Socioeconomic History  . Marital status: Legally Separated    Spouse name: Not on file  . Number of children: 1  . Years of education: Not on file  . Highest education level: Not on file  Occupational History    Employer: [employer]  Tobacco Use  . Smoking status: Never Smoker  . Smokeless tobacco: Never Used  Vaping Use  . Vaping Use: Never used  Substance and Sexual Activity  . Alcohol use: No    Alcohol/week: 0.0 standard drinks  . Drug use: Yes    Types: Other-see comments, Amphetamines, Fentanyl, Heroin    Comment: subutex tx, snorted heroin last on 5/8  . Sexual activity: Yes    Partners: Male    Birth control/protection: None  Other Topics Concern  . Not on file  Social History Narrative  . Not on file   Social Determinants of Health   Financial Resource Strain:   . Difficulty of Paying Living Expenses: Not on file  Food Insecurity:   . Worried About Programme researcher, broadcasting/film/video in the Last Year: Not on file  . Ran Out of Food in the Last Year: Not on file  Transportation Needs:   . Lack of Transportation (Medical): Not on file  . Lack of Transportation (Non-Medical): Not on file  Physical Activity:   . Days of Exercise per Week: Not on file  . Minutes of Exercise per Session: Not on file  Stress:   . Feeling of Stress : Not on file  Social Connections:   . Frequency of Communication with Friends and Family: Not on file  . Frequency of Social Gatherings with Friends and Family: Not on file  . Attends Religious Services: Not on file  . Active Member of Clubs or Organizations: Not on file  . Attends Banker Meetings: Not on file  . Marital Status: Not on file   Additional Social  History:                         Sleep: Good  Appetite:  Good  Current Medications: Current Facility-Administered Medications  Medication Dose Route Frequency Provider Last Rate Last Admin  . acetaminophen (TYLENOL) tablet 650 mg  650 mg Oral Q6H PRN Clapacs, Jackquline Denmark, MD   650 mg at 12/11/19 0956  . alum & mag hydroxide-simeth (MAALOX/MYLANTA) 200-200-20 MG/5ML suspension 30 mL  30 mL Oral Q4H PRN Clapacs, John T, MD      . hydrOXYzine (ATARAX/VISTARIL) tablet 50 mg  50 mg Oral TID PRN Clapacs, Jackquline Denmark, MD   50 mg at 12/11/19 0959  . influenza vac split quadrivalent PF (FLUARIX) injection 0.5 mL  0.5 mL Intramuscular Tomorrow-1000 Jesse Sans, MD      . magnesium hydroxide (MILK OF MAGNESIA) suspension 30 mL  30 mL Oral Daily PRN Clapacs, John T, MD      . nicotine (NICODERM CQ - dosed in mg/24 hours) patch 21 mg  21 mg Transdermal Q0600 Clapacs, Jackquline Denmark, MD   21 mg at 12/11/19 0956  . QUEtiapine (SEROQUEL) tablet 100 mg  100 mg Oral QHS Clapacs, John T, MD   100 mg at 12/10/19 2129    Lab Results:  Results for orders placed or performed during the hospital encounter of 12/07/19 (from the past 48 hour(s))  HCV RNA quant     Status: None   Collection Time: 12/09/19  1:34 PM  Result Value Ref Range   HCV Quantitative 749,000 >50 IU/mL   HCV Quantitative Log 5.874 >1.70 log10 IU/mL   Test Information Comment     Comment: (NOTE) The quantitative range of this assay is 15 IU/mL to 100 million IU/mL. Performed At: [facility] [address] [place], [place] [id number] Jolene Schimke MD IR:[id number]     Blood Alcohol level:  Lab Results  Component Value Date   [facility] <10 12/06/2019   ETH <10 12/01/2019    Metabolic Disorder Labs: No results found for: HGBA1C, MPG No results found for: PROLACTIN No results found for: CHOL, TRIG, HDL, CHOLHDL, VLDL, LDLCALC  Physical Findings: AIMS: Facial and Oral Movements Muscles of Facial Expression: None, normal Lips  and Perioral Area: None, normal Jaw: None, normal Tongue: None, normal,Extremity Movements Upper (arms, wrists, hands, fingers): None, normal Lower (legs, knees, ankles, toes): None, normal, Trunk Movements Neck, shoulders, hips: None, normal, Overall Severity Severity of abnormal movements (highest score from questions above): None, normal Incapacitation due to abnormal movements: None, normal Patient's awareness of abnormal movements (rate only patient's report): No Awareness, Dental Status Current problems with teeth and/or dentures?: No Does patient usually wear dentures?: No  CIWA:    COWS:     Musculoskeletal: Strength & Muscle Tone: within normal limits Gait & Station: normal Patient leans: N/A  Psychiatric  Specialty Exam: Physical Exam   Review of Systems   Blood pressure 108/77, pulse 88, temperature 98 F (36.7 C), temperature source Oral, resp. rate 18, height 5\' 1"  (1.549 m), weight 105 kg, last menstrual period 12/01/2019, SpO2 99 %, unknown if currently breastfeeding.Body mass index is 43.74 kg/m.  General Appearance: Casual  Eye Contact:  Good  Speech:  Clear and Coherent and Normal Rate  Volume:  Normal  Mood:  Euthymic  Affect:  Appropriate and Congruent  Thought Process:  Coherent and Goal Directed  Orientation:  Full (Time, Place, and Person)  Thought Content:  Logical  Suicidal Thoughts:  No  Homicidal Thoughts:  No  Memory:  Immediate;   Fair Recent;   Fair Remote;   NA  Judgement:  Fair  Insight:  Fair  Psychomotor Activity:  Normal  Concentration:  Concentration: Fair and Attention Span: Fair  Recall:  13/04/2019 of Knowledge:  Fair  Language:  Fair  Akathisia:  No  Handed:  Right  AIMS (if indicated):     Assets:  Communication Skills Desire for Improvement Resilience Social Support  ADL's:  Intact  Cognition:  WNL  Sleep:  Number of Hours: 8.5     Treatment Plan Summary: Daily contact with patient to assess and evaluate symptoms  and progress in treatment and Medication management   Patient is a 24 year old female with the above-stated past psychiatric history who is seen in follow-up.  Chart reviewed. Patient discussed with nursing. Patient reports mood improvement today likely in settings of both - after psychotropic medications were restarted and due to not having symptoms of acute opioid withdrawal currently. Denies unsafe thoughts, denies symptoms of psychosis, reports improved sleep. No side effects from medications reported. The current med combination will be continued without changes today. Patient is interested in referral to outpatient substance-abuse treatment program.   Plan: -continue inpatient psych admission; 15-minute checks; daily contact with patient to assess and evaluate symptoms and progress in treatment; psychoeducation.  -continue scheduled medications: . influenza vac split quadrivalent PF  0.5 mL Intramuscular Tomorrow-1000  . nicotine  21 mg Transdermal Q0600  . QUEtiapine  100 mg Oral QHS    -continue PRN medications.  acetaminophen, alum & mag hydroxide-simeth, hydrOXYzine, magnesium hydroxide  -Pertinent Labs:  HCV Quantitative >50 IU/mL 749,000   HCV Quantitative Log >1.70 log10 IU/mL 5.874   Will defer to main treatment team the decision about inpatient within next week vs outpatient ID or hepatology consult.  -EKG: on 11/4 showed Qtc 472 with normal sinus rhythm.   -Disposition: Estimated duration of hospitalization: early-midweek next week. All necessary aftercare will be arranged prior to discharge Likely d/c home with outpatient psych follow-up vs referral to rehab.  -  I certify that the patient does need, on a daily basis, active treatment furnished directly by or requiring the supervision of inpatient psychiatric facility personnel.   13/4, MD 12/11/2019, 10:46 AM

## 2019-12-11 NOTE — Plan of Care (Signed)
  Problem: Education: Goal: Knowledge of [facility] Education information/materials will improve Outcome: Progressing Goal: Emotional status will improve Outcome: Progressing Goal: Mental status will improve Outcome: Progressing Goal: Verbalization of understanding the information provided will improve Outcome: Progressing   Problem: Activity: Goal: Interest or engagement in activities will improve Outcome: Progressing Goal: Sleeping patterns will improve Outcome: Progressing   Problem: Coping: Goal: Ability to verbalize frustrations and anger appropriately will improve Outcome: Progressing Goal: Ability to demonstrate self-control will improve Outcome: Progressing   Problem: Health Behavior/Discharge Planning: Goal: Identification of resources available to assist in meeting health care needs will improve Outcome: Progressing Goal: Compliance with treatment plan for underlying cause of condition will improve Outcome: Progressing   Problem: Physical Regulation: Goal: Ability to maintain clinical measurements within normal limits will improve Outcome: Progressing   Problem: Safety: Goal: Periods of time without injury will increase Outcome: Progressing   Problem: Education: Goal: Knowledge of disease or condition will improve Outcome: Progressing Goal: Understanding of discharge needs will improve Outcome: Progressing   Problem: Health Behavior/Discharge Planning: Goal: Ability to identify changes in lifestyle to reduce recurrence of condition will improve Outcome: Progressing Goal: Identification of resources available to assist in meeting health care needs will improve Outcome: Progressing   Problem: Physical Regulation: Goal: Complications related to the disease process, condition or treatment will be avoided or minimized Outcome: Progressing   Problem: Safety: Goal: Ability to remain free from injury will improve Outcome: Progressing   

## 2019-12-11 NOTE — [facility] Suicide Risk Assessment (Signed)
[facility] INPATIENT:  Family/Significant Other Suicide Prevention Education  Suicide Prevention Education:  Contact Attempts: Raechel Ache [phone], (Mother) has been identified by the patient as the family member/significant other with whom the patient will be residing, and identified as the person(s) who will aid the patient in the event of a mental health crisis.  With written consent from the patient, two attempts were made to provide suicide prevention education, prior to and/or following the patient's discharge.  We were unsuccessful in providing suicide prevention education.  A suicide education pamphlet was given to the patient to share with family/significant other.  Date and time of first attempt:11/10/19/4:28 PM  Date and time of second attempt:  Susa Simmonds 12/11/2019, 4:34 PM

## 2019-12-11 NOTE — Plan of Care (Signed)
  Problem: Education: Goal: Knowledge of [facility] Education information/materials will improve Outcome: Progressing Goal: Emotional status will improve Outcome: Progressing Goal: Mental status will improve Outcome: Progressing Goal: Verbalization of understanding the information provided will improve Outcome: Progressing   Problem: Activity: Goal: Interest or engagement in activities will improve Outcome: Progressing Goal: Sleeping patterns will improve Outcome: Progressing   Problem: Coping: Goal: Ability to verbalize frustrations and anger appropriately will improve Outcome: Progressing Goal: Ability to demonstrate self-control will improve Outcome: Progressing   Problem: Health Behavior/Discharge Planning: Goal: Identification of resources available to assist in meeting health care needs will improve Outcome: Progressing Goal: Compliance with treatment plan for underlying cause of condition will improve Outcome: Progressing   Problem: Physical Regulation: Goal: Ability to maintain clinical measurements within normal limits will improve Outcome: Progressing   Problem: Safety: Goal: Periods of time without injury will increase Outcome: Progressing   Problem: Education: Goal: Knowledge of disease or condition will improve Outcome: Progressing Goal: Understanding of discharge needs will improve Outcome: Progressing   Problem: Health Behavior/Discharge Planning: Goal: Ability to identify changes in lifestyle to reduce recurrence of condition will improve Outcome: Progressing Goal: Identification of resources available to assist in meeting health care needs will improve Outcome: Progressing   Problem: Physical Regulation: Goal: Complications related to the disease process, condition or treatment will be avoided or minimized Outcome: Progressing   Problem: Safety: Goal: Ability to remain free from injury will improve Outcome: Progressing   

## 2019-12-11 NOTE — [facility] Group Notes (Signed)
LCSW Group Therapy Note   12/11/2019 1:00 PM- 2:03 PM    Type of Therapy and Topic: Group Therapy: Fears and Unhealthy Coping Skills   Participation Level: Minimal   Description of Group: The focus of this group was to discuss some of the prevalent fears that patients experience, and to identify the commonalities among group members. An exercise was used to initiate the discussion, followed by writing on the white board a group-generated list of unhealthy coping and healthy coping techniques to deal with each fear.   Therapeutic Goals: 1. Patient will identify and describe 3 fears they experience 2. Patient will identify one positive coping strategy for each fear they experience 3. Patient will respond empathically to peers statements regarding fears they experience   Summary of Patient Progress: Patient came into group at the end. Patient was able to talk about her mother being more than a friend to her than a mother. Patient stated she has a fear of being a daughter.     Therapeutic Modalities Cognitive Behavioral Therapy Motivational Interviewing    Susa Simmonds, [place] 12/11/2019 3:19 PM

## 2019-12-11 NOTE — Progress Notes (Signed)
Patient's affect is much brighter today. She is animated. Out of her room more. She is calm and cooperative. Denies SI, HI and AVH

## 2019-12-12 DIAGNOSIS — F319 Bipolar disorder, unspecified: Secondary | ICD-10-CM | POA: Diagnosis not present

## 2019-12-12 NOTE — [facility] Suicide Risk Assessment (Signed)
[facility] INPATIENT:  Family/Significant Other Suicide Prevention Education  Suicide Prevention Education:  Contact Attempts: Raechel Ache, Mother, [phone], has been identified by the patient as the family member/significant other with whom the patient will be residing, and identified as the person(s) who will aid the patient in the event of a mental health crisis.  With written consent from the patient, two attempts were made to provide suicide prevention education, prior to and/or following the patient's discharge.  We were unsuccessful in providing suicide prevention education.  A suicide education pamphlet was given to the patient to share with family/significant other.  Date and time of first attempt:11/10/19/4:28 PM  Date and time of second attempt:12/12/2019 at 3:55PM  CSW left HIPAA compliant voicemail.    Harden Mo 12/12/2019, 3:54 PM

## 2019-12-12 NOTE — [facility] Counselor (Signed)
CSW spoke with [place] at [facility] to check on status of referral. CSW was informed patient was approved, but no current beds available.  She reports that they will call once beds become available.   CSW called [facility] to check on status of referral.  CSW was informed that they would call back.  Penni Homans, MSW, LCSW 12/12/2019 10:42 AM

## 2019-12-12 NOTE — [facility] Group Notes (Signed)
LCSW Group Therapy Note   12/12/2019 12:41 PM  Type of Therapy and Topic:  Group Therapy:  Overcoming Obstacles   Participation Level:  Minimal   Description of Group:    In this group patients will be encouraged to explore what they see as obstacles to their own wellness and recovery. They will be guided to discuss their thoughts, feelings, and behaviors related to these obstacles. The group will process together ways to cope with barriers, with attention given to specific choices patients can make. Each patient will be challenged to identify changes they are motivated to make in order to overcome their obstacles. This group will be process-oriented, with patients participating in exploration of their own experiences as well as giving and receiving support and challenge from other group members.   Therapeutic Goals: 1. Patient will identify personal and current obstacles as they relate to admission. 2. Patient will identify barriers that currently interfere with their wellness or overcoming obstacles.  3. Patient will identify feelings, thought process and behaviors related to these barriers. 4. Patient will identify two changes they are willing to make to overcome these obstacles:      Summary of Patient Progress Patient was present in group. Patient shared that her obstacles is "going home and doing the right thing". Patient shared that her home has been a trigger and some of the vices that she was hoping to avoid are present in her home.  Patient disengaged from group after making this statement and appeared to fall asleep.    Therapeutic Modalities:   Cognitive Behavioral Therapy Solution Focused Therapy Motivational Interviewing Relapse Prevention Therapy  Penni Homans, MSW, LCSW 12/12/2019 12:41 PM

## 2019-12-12 NOTE — Plan of Care (Signed)
°  Problem: Group Participation °Goal: STG - Patient will engage in groups without prompting or encouragement from LRT x3 group sessions within 5 recreation therapy group sessions °Description: STG - Patient will engage in groups without prompting or encouragement from LRT x3 group sessions within 5 recreation therapy group sessions °Outcome: Progressing °  °

## 2019-12-12 NOTE — Progress Notes (Signed)
Recreation Therapy Notes   Date: 12/12/2019  Time: 9:30 am   Location: Craft room  Behavioral response: Appropriate  Intervention Topic: Stress Management    Discussion/Intervention:  Group content on today was focused on stress. The group defined stress and way to cope with stress. Participants expressed how they know when they are stresses out. Individuals described the different ways they have to cope with stress. The group stated reasons why it is important to cope with stress. Patient explained what good stress is and some examples. The group participated in the intervention "Stress Management". Individuals were separated into two group and answered questions related to stress.  Clinical Observations/Feedback: Patient came to group and define stress management what ever works for that time.She explained that she manages her stress by listening to music and journaling.  Participant stated that when she is stressed she is short with people and picks at her skin. Individual was social with peers and staff while participating in the intervention.  Arnetha Silverthorne LRT/CTRS         Charae Depaolis 12/12/2019 1:57 PM

## 2019-12-12 NOTE — Progress Notes (Signed)
[facility] MD Progress Note  12/12/2019 1:43 PM Jill Shepherd  MRN:  [id number]  Principal Problem: Bipolar 1 disorder, depressed (HCC) Diagnosis: Principal Problem:   Bipolar 1 disorder, depressed (HCC) Active Problems:   Anxiety   Opiate abuse, continuous (HCC)   Methamphetamine abuse (HCC)   Amphetamine and psychostimulant-induced psychosis with hallucinations [facility])  Jill Shepherd is a  24y.o. female who was admitted to the [facility] unit for psychosis, agitation, suicidal ideations.  Interval History Patient was seen today for re-evaluation.  Nursing reports no events overnight. The patient has no issues with performing ADLs.  Patient has been medication compliant.    Subjective:  On assessment patient reports "I feel good". She denies any current physical complaints. She reports  no opioid withdrawal symptoms  She reports she slept well last night. She reports mood improvement - states she is calm, not depressed anymore, less anxious, denies suicidal or homicidal thoughts. She denies any hallucinations today. The patient reports no side effects from medications. She is interested in referral to inpatient substance-abuse treatment program. Patient approved by [facility], but no beds currently available. Referral also sent to [facility]. 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 Patient understanding of this news.   Labs: no new results for review.  Total Time spent with patient: 30 minutes  Past Psychiatric History: seed H&P  Past Medical History:  Past Medical History:  Diagnosis Date  . Drug abuse (HCC)   . Headache   . SAB (spontaneous abortion) 01/19/2014   stillborn at 20.4 wks    Past Surgical History:  Procedure Laterality Date  . INCISION AND DRAINAGE ABSCESS Left 05/07/2019   Procedure: INCISION AND DRAINAGE ABSCESS;  Surgeon: Leafy Ro, MD;  Location: [facility] ORS;  Service: General;  Laterality: Left;   Family History:   Family History  Problem Relation Age of Onset  . Hypertension Father   . Hypertension Paternal Grandfather    Family Psychiatric  History: see H&P  Social History:  Social History   Substance and Sexual Activity  Alcohol Use No  . Alcohol/week: 0.0 standard drinks     Social History   Substance and Sexual Activity  Drug Use Yes  . Types: Other-see comments, Amphetamines, Fentanyl, Heroin   Comment: subutex tx, snorted heroin last on 5/8    Social History   Socioeconomic History  . Marital status: Legally Separated    Spouse name: Not on file  . Number of children: 1  . Years of education: Not on file  . Highest education level: Not on file  Occupational History    Employer: [employer]  Tobacco Use  . Smoking status: Never Smoker  . Smokeless tobacco: Never Used  Vaping Use  . Vaping Use: Never used  Substance and Sexual Activity  . Alcohol use: No    Alcohol/week: 0.0 standard drinks  . Drug use: Yes    Types: Other-see comments, Amphetamines, Fentanyl, Heroin    Comment: subutex tx, snorted heroin last on 5/8  . Sexual activity: Yes    Partners: Male    Birth control/protection: None  Other Topics Concern  . Not on file  Social History Narrative  . Not on file   Social Determinants of Health   Financial Resource Strain:   . Difficulty of Paying Living Expenses: Not on file  Food Insecurity:   . Worried About Programme researcher, broadcasting/film/video in the Last Year: Not on file  .  Ran Out of Food in the Last Year: Not on file  Transportation Needs:   . Lack of Transportation (Medical): Not on file  . Lack of Transportation (Non-Medical): Not on file  Physical Activity:   . Days of Exercise per Week: Not on file  . Minutes of Exercise per Session: Not on file  Stress:   . Feeling of Stress : Not on file  Social Connections:   . Frequency of Communication with Friends and Family: Not on file  . Frequency of Social Gatherings with Friends and Family: Not on file  .  Attends Religious Services: Not on file  . Active Member of Clubs or Organizations: Not on file  . Attends Banker Meetings: Not on file  . Marital Status: Not on file   Additional Social History:                         Sleep: Good  Appetite:  Good  Current Medications: Current Facility-Administered Medications  Medication Dose Route Frequency Provider Last Rate Last Admin  . acetaminophen (TYLENOL) tablet 650 mg  650 mg Oral Q6H PRN Clapacs, Jackquline Denmark, MD   650 mg at 12/11/19 0956  . alum & mag hydroxide-simeth (MAALOX/MYLANTA) 200-200-20 MG/5ML suspension 30 mL  30 mL Oral Q4H PRN Clapacs, John T, MD      . hydrOXYzine (ATARAX/VISTARIL) tablet 50 mg  50 mg Oral TID PRN Clapacs, Jackquline Denmark, MD   50 mg at 12/12/19 0806  . influenza vac split quadrivalent PF (FLUARIX) injection 0.5 mL  0.5 mL Intramuscular Tomorrow-1000 Jesse Sans, MD      . magnesium hydroxide (MILK OF MAGNESIA) suspension 30 mL  30 mL Oral Daily PRN Clapacs, John T, MD      . nicotine (NICODERM CQ - dosed in mg/24 hours) patch 21 mg  21 mg Transdermal Q0600 Clapacs, Jackquline Denmark, MD   21 mg at 12/12/19 0805  . QUEtiapine (SEROQUEL) tablet 100 mg  100 mg Oral QHS Clapacs, Jackquline Denmark, MD   100 mg at 12/11/19 2114    Lab Results:  No results found for this or any previous visit (from the past 48 hour(s)).  Blood Alcohol level:  Lab Results  Component Value Date   ETH <10 12/06/2019   ETH <10 12/01/2019    Metabolic Disorder Labs: No results found for: HGBA1C, MPG No results found for: PROLACTIN No results found for: CHOL, TRIG, HDL, CHOLHDL, VLDL, LDLCALC  Physical Findings: AIMS: Facial and Oral Movements Muscles of Facial Expression: None, normal Lips and Perioral Area: None, normal Jaw: None, normal Tongue: None, normal,Extremity Movements Upper (arms, wrists, hands, fingers): None, normal Lower (legs, knees, ankles, toes): None, normal, Trunk Movements Neck, shoulders, hips: None, normal,  Overall Severity Severity of abnormal movements (highest score from questions above): None, normal Incapacitation due to abnormal movements: None, normal Patient's awareness of abnormal movements (rate only patient's report): No Awareness, Dental Status Current problems with teeth and/or dentures?: No Does patient usually wear dentures?: No  CIWA:    COWS:     Musculoskeletal: Strength & Muscle Tone: within normal limits Gait & Station: normal Patient leans: N/A  Psychiatric Specialty Exam: Physical Exam Vitals and nursing note reviewed.  HENT:     Head: Normocephalic and atraumatic.     Right Ear: External ear normal.     Left Ear: External ear normal.     Nose: Nose normal.     Mouth/Throat:  Mouth: Mucous membranes are moist.     Pharynx: Oropharynx is clear.  Eyes:     Extraocular Movements: Extraocular movements intact.     Conjunctiva/sclera: Conjunctivae normal.     Pupils: Pupils are equal, round, and reactive to light.  Cardiovascular:     Rate and Rhythm: Tachycardia present.     Pulses: Normal pulses.  Pulmonary:     Effort: Pulmonary effort is normal.     Breath sounds: Normal breath sounds.  Abdominal:     General: Abdomen is flat.     Palpations: Abdomen is soft.  Musculoskeletal:        General: No swelling. Normal range of motion.     Cervical back: Normal range of motion and neck supple.  Skin:    General: Skin is warm and dry.  Neurological:     General: No focal deficit present.     Mental Status: She is oriented to person, place, and time.  Psychiatric:        Attention and Perception: Attention and perception normal.        Mood and Affect: Mood is anxious.        Speech: Speech normal.        Behavior: Behavior is cooperative.        Thought Content: Thought content normal.        Cognition and Memory: Cognition and memory normal.        Judgment: Judgment is impulsive.     Review of Systems  Constitutional: Negative for appetite  change and fatigue.  HENT: Negative for rhinorrhea and sore throat.   Eyes: Negative for photophobia and visual disturbance.  Respiratory: Negative for cough and shortness of breath.   Cardiovascular: Negative for chest pain and palpitations.  Gastrointestinal: Negative for constipation, diarrhea, nausea and vomiting.  Endocrine: Negative for cold intolerance and heat intolerance.  Genitourinary: Negative for difficulty urinating and dysuria.  Musculoskeletal: Negative for arthralgias and myalgias.  Skin: Negative for rash and wound.  Allergic/Immunologic: Negative for environmental allergies and food allergies.  Neurological: Negative for dizziness and headaches.  Hematological: Negative for adenopathy. Does not bruise/bleed easily.  Psychiatric/Behavioral: Negative for hallucinations and suicidal ideas. The patient is nervous/anxious.     Blood pressure 105/62, pulse (!) 108, temperature 98.2 F (36.8 C), temperature source Oral, resp. rate 17, height 5\' 1"  (1.549 m), weight 105 kg, last menstrual period 12/01/2019, SpO2 100 %, unknown if currently breastfeeding.Body mass index is 43.74 kg/m.  General Appearance: Casual  Eye Contact:  Good  Speech:  Clear and Coherent and Normal Rate  Volume:  Normal  Mood:  Euthymic  Affect:  Appropriate and Congruent  Thought Process:  Coherent and Goal Directed  Orientation:  Full (Time, Place, and Person)  Thought Content:  Logical  Suicidal Thoughts:  No  Homicidal Thoughts:  No  Memory:  Immediate;   Fair Recent;   Fair Remote;   NA  Judgement:  Fair  Insight:  Fair  Psychomotor Activity:  Normal  Concentration:  Concentration: Fair and Attention Span: Fair  Recall:  [employer]Fair  Fund of Knowledge:  Fair  Language:  Fair  Akathisia:  No  Handed:  Right  AIMS (if indicated):     Assets:  Communication Skills Desire for Improvement Resilience Social Support  ADL's:  Intact  Cognition:  WNL  Sleep:  Number of Hours: 8.25      Treatment Plan Summary: Daily contact with patient to assess and evaluate symptoms and progress in  treatment and Medication management   Patient is a 24 year old female with the above-stated past psychiatric history who is seen in follow-up.  Chart reviewed. Patient discussed with nursing. Patient reports mood improvement today likely in settings of both - after psychotropic medications were restarted and due to not having symptoms of acute opioid withdrawal currently. Denies unsafe thoughts, denies symptoms of psychosis, reports improved sleep. No side effects from medications reported. The current med combination will be continued without changes today. Patient is interested in referral to inpatient substance-abuse treatment program.   Plan: -continue inpatient psych admission; 15-minute checks; daily contact with patient to assess and evaluate symptoms and progress in treatment; psychoeducation.  -continue scheduled medications: . influenza vac split quadrivalent PF  0.5 mL Intramuscular Tomorrow-1000  . nicotine  21 mg Transdermal Q0600  . QUEtiapine  100 mg Oral QHS    -continue PRN medications.  acetaminophen, alum & mag hydroxide-simeth, hydrOXYzine, magnesium hydroxide  -Pertinent Labs:  HCV Quantitative >50 IU/mL 749,000   HCV Quantitative Log >1.70 log10 IU/mL 5.874   Infectious disease contacted about results, and they advise outpatient follow-up. Will contact [phone] to schedule appointment at discharge.    -EKG: on 11/4 showed Qtc 472 with normal sinus rhythm.   -Disposition: Estimated duration of hospitalization: Late this week pending inpatient substance abuse treatment vs [facility]IOP. Currently approved for [facility], but no beds available today. All necessary aftercare will be arranged prior to discharge   -  I certify that the patient does need, on a daily basis, active treatment furnished directly by or requiring the supervision of inpatient psychiatric facility  personnel.   Jesse Sans, MD 12/12/2019, 1:43 PM

## 2019-12-12 NOTE — Plan of Care (Signed)
D- Patient alert and oriented. Patient presents in a pleasant mood on assessment stating that she did not sleep at all last night "because I kept dreaming". Patient had no other complaints to voice to this writer at this time. Patient endorsed both depression/anxiety, stating that "wondering if or when I'm going home", is why she's feeling this way. Patient denies SI, HI, AVH, and pain at this time. Patient had no stated goals for today.  A- Scheduled medications administered to patient, per MD orders. Support and encouragement provided.  Routine safety checks conducted every 15 minutes.  Patient informed to notify staff with problems or concerns.  R- No adverse drug reactions noted. Patient contracts for safety at this time. Patient compliant with medications and treatment plan. Patient receptive, calm, and cooperative. Patient interacts well with others on the unit.  Patient remains safe at this time.  Problem: Education: Goal: Knowledge of [facility] Education information/materials will improve Outcome: Progressing Goal: Emotional status will improve Outcome: Progressing Goal: Mental status will improve Outcome: Progressing Goal: Verbalization of understanding the information provided will improve Outcome: Progressing   Problem: Activity: Goal: Interest or engagement in activities will improve Outcome: Progressing Goal: Sleeping patterns will improve Outcome: Progressing   Problem: Coping: Goal: Ability to verbalize frustrations and anger appropriately will improve Outcome: Progressing Goal: Ability to demonstrate self-control will improve Outcome: Progressing   Problem: Health Behavior/Discharge Planning: Goal: Identification of resources available to assist in meeting health care needs will improve Outcome: Progressing Goal: Compliance with treatment plan for underlying cause of condition will improve Outcome: Progressing   Problem: Physical Regulation: Goal: Ability  to maintain clinical measurements within normal limits will improve Outcome: Progressing   Problem: Safety: Goal: Periods of time without injury will increase Outcome: Progressing   Problem: Education: Goal: Knowledge of disease or condition will improve Outcome: Progressing Goal: Understanding of discharge needs will improve Outcome: Progressing   Problem: Health Behavior/Discharge Planning: Goal: Ability to identify changes in lifestyle to reduce recurrence of condition will improve Outcome: Progressing Goal: Identification of resources available to assist in meeting health care needs will improve Outcome: Progressing   Problem: Physical Regulation: Goal: Complications related to the disease process, condition or treatment will be avoided or minimized Outcome: Progressing   Problem: Safety: Goal: Ability to remain free from injury will improve Outcome: Progressing

## 2019-12-12 NOTE — Progress Notes (Signed)
   12/12/19 1400  Clinical Encounter Type  Visited With Patient  Visit Type Initial;Spiritual support;Social support;Behavioral Health  Referral From Chaplain  Consult/Referral To Chaplain  Pt attended Ch group today. Pt participated in group. Ch will follow-up with Pt.  

## 2019-12-13 DIAGNOSIS — F319 Bipolar disorder, unspecified: Secondary | ICD-10-CM | POA: Diagnosis not present

## 2019-12-13 MED ORDER — QUETIAPINE FUMARATE 200 MG PO TABS
200.0000 mg | ORAL_TABLET | Freq: Every day | ORAL | Status: DC
  Administered 2019-12-13 – 2019-12-22 (×10): 200 mg via ORAL
  Filled 2019-12-13 (×10): qty 1

## 2019-12-13 NOTE — Progress Notes (Signed)
   12/13/19 2103  Psych Admission Type (Psych Patients Only)  Admission Status Voluntary  Psychosocial Assessment  Patient Complaints Sleep disturbance (Pt reports vivid nightmares)  Eye Contact Fair  Facial Expression Other (Comment) (Appropriate to circumstance)  Affect Appropriate to circumstance  Speech Logical/coherent  Interaction Assertive  Motor Activity Other (Comment) (WDL)  Appearance/Hygiene Unremarkable  Behavior Characteristics Cooperative;Appropriate to situation  Mood Pleasant  Thought Process  Coherency WDL  Content WDL  Delusions None reported or observed  Perception WDL  Hallucination None reported or observed  Judgment WDL  Confusion None  Danger to Others  Danger to Others None reported or observed  D: Patient reports she had a good day. Patient reports she is still concerned about the vivid nightmares as a result of her medication but otherwise had no complaints. Patient denies SI/HI at this time. Patient also denies AH/VH at this time.  A: Provided positive reinforcement and medication education.  R: Patient cooperative and receptive to efforts. Patient remains safe on unit.

## 2019-12-13 NOTE — Progress Notes (Signed)
Patient is alert and oriented.  She is in a pleasant and cheerful mood.  She denies SI  HI  AVH her pain level tonight is rated 6 and received Tylenol for menstrual cramps. She endorsed anxiety and depression at this encounter, but states she is getting better.  She is med compliant and tolerated her medication with out incident.  She is safe on the unit with 15 minute safety checks and was encouraged to contact staff with any concerns.       Cleo Butler-Nicholson, LPN

## 2019-12-13 NOTE — Progress Notes (Signed)
[facility]  12/13/2019 10:01 AM Jill Shepherd  MRN:  [id number]  Principal Problem: Bipolar 1 disorder, depressed (HCC) Diagnosis: Principal Problem:   Bipolar 1 disorder, depressed (HCC) Active Problems:   Anxiety   Opiate abuse, continuous (HCC)   Methamphetamine abuse (HCC)   Amphetamine and psychostimulant-induced psychosis with hallucinations [facility](HCC)  Mr. Jill Shepherd is a  24y.o. female [facility], agitation, suicidal ideations.  Interval History Patient was seen today for re-evaluation.  Nursing reports no events overnight. The patient has no issues with performing ADLs.  Patient has been medication compliant.    Subjective:  On assessment patient reports "I'm feeling anxious". She denies any current physical complaints. She reports  no opioid withdrawal symptoms  She reports slept poorly overnight, and had vivid nightmares. She does not wish to stop Seroquel as it has helped her in the past, but notes she has had difficulty sleeping each night and requests an increase in dose. Informed patient that Seroquel can cause vivid dreams, and to let us know if increase in dose worsens nightmares. She denies suicidal or homicidal thoughts. She denies any hallucinations today. The patient reports no side effects from medications. She is interested in referral to inpatient substance-abuse treatment program. Patient approved by [facility], but no beds currently available. [facility] She plans to call [facility] today to see if beds are available at this facility.   Labs: no new results for review.  Total Time spent with patient: 30 minutes  Past Psychiatric History: seed H&P  Past Medical History:  Past Medical History:  Diagnosis Date  . Drug abuse (HCC)   . Headache   . SAB (spontaneous abortion) 01/19/2014   stillborn at 20.4 wks    Past Surgical History:  Procedure Laterality Date  . INCISION AND DRAINAGE ABSCESS Left 05/07/2019    Procedure: INCISION AND DRAINAGE ABSCESS;  Surgeon: Leafy RoPabon, Diego F, MD;  Location: [facility] ORS;  Service: General;  Laterality: Left;   Family History:  Family History  Problem Relation Age of Onset  . Hypertension Father   . Hypertension Paternal Grandfather    Family Psychiatric  History: see H&P  Social History:  Social History   Substance and Sexual Activity  Alcohol Use No  . Alcohol/week: 0.0 standard drinks     Social History   Substance and Sexual Activity  Drug Use Yes  . Types: Other-see comments, Amphetamines, Fentanyl, Heroin   Comment: subutex tx, snorted heroin last on 5/8    Social History   Socioeconomic History  . Marital status: Legally Separated    Spouse name: Not on file  . Number of children: 1  . Years of education: Not on file  . Highest education level: Not on file  Occupational History    Employer: [employer]  Tobacco Use  . Smoking status: Never Smoker  . Smokeless tobacco: Never Used  Vaping Use  . Vaping Use: Never used  Substance and Sexual Activity  . Alcohol use: No    Alcohol/week: 0.0 standard drinks  . Drug use: Yes    Types: Other-see comments, Amphetamines, Fentanyl, Heroin    Comment: subutex tx, snorted heroin last on 5/8  . Sexual activity: Yes    Partners: Male    Birth control/protection: None  Other Topics Concern  . Not on file  Social History Narrative  . Not on file   Social Determinants of Health   Financial Resource Strain:   .  Difficulty of Paying Living Expenses: Not on file  Food Insecurity:   . Worried About Programme researcher, broadcasting/film/video in the Last Year: Not on file  . Ran Out of Food in the Last Year: Not on file  Transportation Needs:   . Lack of Transportation (Medical): Not on file  . Lack of Transportation (Non-Medical): Not on file  Physical Activity:   . Days of Exercise per Week: Not on file  . Minutes of Exercise per Session: Not on file  Stress:   . Feeling of Stress : Not on file  Social  Connections:   . Frequency of Communication with Friends and Family: Not on file  . Frequency of Social Gatherings with Friends and Family: Not on file  . Attends Religious Services: Not on file  . Active Member of Clubs or Organizations: Not on file  . Attends Banker Meetings: Not on file  . Marital Status: Not on file   Additional Social History:                         Sleep: Good  Appetite:  Good  Current Medications: Current Facility-Administered Medications  Medication Dose Route Frequency Provider Last Rate Last Admin  . acetaminophen (TYLENOL) tablet 650 mg  650 mg Oral Q6H PRN Clapacs, Jackquline Denmark, MD   650 mg at 12/12/19 2107  . alum & mag hydroxide-simeth (MAALOX/MYLANTA) 200-200-20 MG/5ML suspension 30 mL  30 mL Oral Q4H PRN Clapacs, John T, MD      . hydrOXYzine (ATARAX/VISTARIL) tablet 50 mg  50 mg Oral TID PRN Clapacs, Jackquline Denmark, MD   50 mg at 12/13/19 0758  . influenza vac split quadrivalent PF (FLUARIX) injection 0.5 mL  0.5 mL Intramuscular Tomorrow-1000 Jesse Sans, MD      . magnesium hydroxide (MILK OF MAGNESIA) suspension 30 mL  30 mL Oral Daily PRN Clapacs, John T, MD      . nicotine (NICODERM CQ - dosed in mg/24 hours) patch 21 mg  21 mg Transdermal Q0600 Clapacs, Jackquline Denmark, MD   21 mg at 12/13/19 0759  . QUEtiapine (SEROQUEL) tablet 200 mg  200 mg Oral QHS Jesse Sans, MD        Lab Results:  No results found for this or any previous visit (from the past 48 hour(s)).  Blood Alcohol level:  Lab Results  Component Value Date   ETH <10 12/06/2019   ETH <10 12/01/2019    Metabolic Disorder Labs: No results found for: HGBA1C, MPG No results found for: PROLACTIN No results found for: CHOL, TRIG, HDL, CHOLHDL, VLDL, LDLCALC  Physical Findings: AIMS: Facial and Oral Movements Muscles of Facial Expression: None, normal Lips and Perioral Area: None, normal Jaw: None, normal Tongue: None, normal,Extremity Movements Upper (arms,  wrists, hands, fingers): None, normal Lower (legs, knees, ankles, toes): None, normal, Trunk Movements Neck, shoulders, hips: None, normal, Overall Severity Severity of abnormal movements (highest score from questions above): None, normal Incapacitation due to abnormal movements: None, normal Patient's awareness of abnormal movements (rate only patient's report): No Awareness, Dental Status Current problems with teeth and/or dentures?: No Does patient usually wear dentures?: No  CIWA:    COWS:     Musculoskeletal: Strength & Muscle Tone: within normal limits Gait & Station: normal Patient leans: N/A  Psychiatric Specialty Exam: Physical Exam Vitals and nursing note reviewed.  HENT:     Head: Normocephalic and atraumatic.     Right  Ear: External ear normal.     Left Ear: External ear normal.     Nose: Nose normal.     Mouth/Throat:     Mouth: Mucous membranes are moist.     Pharynx: Oropharynx is clear.  Eyes:     Extraocular Movements: Extraocular movements intact.     Conjunctiva/sclera: Conjunctivae normal.     Pupils: Pupils are equal, round, and reactive to light.  Cardiovascular:     Rate and Rhythm: Tachycardia present.     Pulses: Normal pulses.  Pulmonary:     Effort: Pulmonary effort is normal.     Breath sounds: Normal breath sounds.  Abdominal:     General: Abdomen is flat.     Palpations: Abdomen is soft.  Musculoskeletal:        General: No swelling. Normal range of motion.     Cervical back: Normal range of motion and neck supple.  Skin:    General: Skin is warm and dry.  Neurological:     General: No focal deficit present.     Mental Status: She is oriented to person, place, and time.  Psychiatric:        Attention and Perception: Attention and perception normal.        Mood and Affect: Mood is anxious.        Speech: Speech normal.        Behavior: Behavior is cooperative.        Thought Content: Thought content normal.        Cognition and  Memory: Cognition and memory normal.        Judgment: Judgment is impulsive.     Review of Systems  Constitutional: Negative for appetite change and fatigue.  HENT: Negative for rhinorrhea and sore throat.   Eyes: Negative for photophobia and visual disturbance.  Respiratory: Negative for cough and shortness of breath.   Cardiovascular: Negative for chest pain and palpitations.  Gastrointestinal: Negative for constipation, diarrhea, nausea and vomiting.  Endocrine: Negative for cold intolerance and heat intolerance.  Genitourinary: Negative for difficulty urinating and dysuria.  Musculoskeletal: Negative for arthralgias and myalgias.  Skin: Negative for rash and wound.  Allergic/Immunologic: Negative for environmental allergies and food allergies.  Neurological: Negative for dizziness and headaches.  Hematological: Negative for adenopathy. Does not bruise/bleed easily.  Psychiatric/Behavioral: Negative for hallucinations and suicidal ideas. The patient is nervous/anxious.     Blood pressure 108/75, pulse 87, temperature 98.4 F (36.9 C), temperature source Oral, resp. rate 18, height 5\' 1"  (1.549 m), weight 105 kg, last menstrual period 12/01/2019, SpO2 100 %, unknown if currently breastfeeding.Body mass index is 43.74 kg/m.  General Appearance: Casual  Eye Contact:  Good  Speech:  Clear and Coherent and Normal Rate  Volume:  Normal  Mood:  Euthymic  Affect:  Appropriate and Congruent  Thought Process:  Coherent and Goal Directed  Orientation:  Full (Time, Place, and Person)  Thought Content:  Logical  Suicidal Thoughts:  No  Homicidal Thoughts:  No  Memory:  Immediate;   Fair Recent;   Fair Remote;   NA  Judgement:  Fair  Insight:  Fair  Psychomotor Activity:  Normal  Concentration:  Concentration: Fair and Attention Span: Fair  Recall:  13/04/2019 of Knowledge:  Fair  Language:  Fair  Akathisia:  No  Handed:  Right  AIMS (if indicated):     Assets:  Communication  Skills Desire for Improvement Resilience Social Support  ADL's:  Intact  Cognition:  WNL  Sleep:  Number of Hours: 8.25     Treatment Plan Summary: Daily contact with patient to assess and evaluate symptoms and progress in treatment and Medication management   Patient is a 24 year old female with the above-stated past psychiatric history who is seen in follow-up.  Chart reviewed. Patient discussed with nursing. Patient reports mood improvement today likely in settings of both - after psychotropic medications were restarted and due to not having symptoms of acute opioid withdrawal currently. Denies unsafe thoughts, denies symptoms of psychosis, reports improved sleep. No side effects from medications reported. The current med combination will be continued without changes today. Patient is interested in referral to inpatient substance-abuse treatment program.   Plan: -continue inpatient psych admission; 15-minute checks; daily contact with patient to assess and evaluate symptoms and progress in treatment; psychoeducation.  -continue scheduled medications: . influenza vac split quadrivalent PF  0.5 mL Intramuscular Tomorrow-1000  . nicotine  21 mg Transdermal Q0600  . QUEtiapine  200 mg Oral QHS    -continue PRN medications.  acetaminophen, alum & mag hydroxide-simeth, hydrOXYzine, magnesium hydroxide  -Pertinent Labs:  HCV Quantitative >50 IU/mL 749,000   HCV Quantitative Log >1.70 log10 IU/mL 5.874   Infectious disease contacted about results, and they advise outpatient follow-up. Will contact [phone] to schedule appointment at discharge.    -EKG: on 11/4 showed Qtc 472 with normal sinus rhythm.   -Disposition: Estimated duration of hospitalization: Late this week pending inpatient substance abuse treatment vs SAIOP. Currently approved for [facility], but no beds available today. All necessary aftercare will be arranged prior to discharge   -  I certify that the patient does  need, on a daily basis, active treatment furnished directly by or requiring the supervision of inpatient psychiatric facility personnel.   Jesse Sans, MD 12/13/2019, 10:01 AM

## 2019-12-13 NOTE — [facility] Suicide Risk Assessment (Signed)
[facility] INPATIENT:  Family/Significant Other Suicide Prevention Education  Suicide Prevention Education:  Education Completed; Jill Shepherd, mother, [phone], has been identified by the patient as the family member/significant other with whom the patient will be residing, and identified as the person(s) who will aid the patient in the event of a mental health crisis (suicidal ideations/suicide attempt).  With written consent from the patient, the family member/significant other has been provided the following suicide prevention education, prior to the and/or following the discharge of the patient.  The suicide prevention education provided includes the following:  Suicide risk factors  Suicide prevention and interventions  National Suicide Hotline telephone number  [facility] assessment telephone number  [facility] Emergency Assistance 911  [facility] and/or Residential Mobile Crisis Unit telephone number  Request made of family/significant other to:  Remove weapons (e.g., guns, rifles, knives), all items previously/currently identified as safety concern.    Remove drugs/medications (over-the-counter, prescriptions, illicit drugs), all items previously/currently identified as a safety concern.  The family member/significant other verbalizes understanding of the suicide prevention education information provided.  The family member/significant other agrees to remove the items of safety concern listed above.  CSW spoke with the patient's mother. Mother reports that the patient can not go to her home. She reports a desire for the patient to be in a long-term treatment program.  She reports that patient has been battling an addiction for 2 years.  She reports that the patient has "lost everything, her clothes, her husband, her child, her car".  She reports concerns that the patient will be discharged and begin using substances again.    Harden Mo 12/13/2019,  1:37 PM

## 2019-12-13 NOTE — Progress Notes (Signed)
D: Pt alert and oriented. Pt rates depression 6/10, and anxiety 6/10. Pt denies experiencing any pain at this time. Pt denies experiencing any SI/HI, or AVH at this time.   A: Scheduled medications administered to pt, per MD orders. Support and encouragement provided. Frequent verbal contact made. Routine safety checks conducted q15 minutes.   R: No adverse drug reactions noted. Pt verbally contracts for safety at this time. Pt complaint with medications. Pt interacts well with others on the unit. Pt remains safe at this time. Will continue to monitor.

## 2019-12-13 NOTE — [facility] Counselor (Signed)
CSW contacted [facility], [phone] and spoke with [facility].  Drema Pry reports that she will call back in 15-20 minutes.  Penni Homans, MSW, LCSW 12/13/2019 11:29 AM

## 2019-12-13 NOTE — Plan of Care (Signed)
  Problem: Education: Goal: Knowledge of [facility] Education information/materials will improve Outcome: Progressing Goal: Emotional status will improve Outcome: Progressing Goal: Mental status will improve Outcome: Progressing Goal: Verbalization of understanding the information provided will improve Outcome: Progressing   Problem: Activity: Goal: Interest or engagement in activities will improve Outcome: Progressing Goal: Sleeping patterns will improve Outcome: Progressing   Problem: Coping: Goal: Ability to verbalize frustrations and anger appropriately will improve Outcome: Progressing Goal: Ability to demonstrate self-control will improve Outcome: Progressing   Problem: Health Behavior/Discharge Planning: Goal: Identification of resources available to assist in meeting health care needs will improve Outcome: Progressing Goal: Compliance with treatment plan for underlying cause of condition will improve Outcome: Progressing   Problem: Physical Regulation: Goal: Ability to maintain clinical measurements within normal limits will improve Outcome: Progressing   Problem: Safety: Goal: Periods of time without injury will increase Outcome: Progressing   Problem: Education: Goal: Knowledge of disease or condition will improve Outcome: Progressing Goal: Understanding of discharge needs will improve Outcome: Progressing   Problem: Health Behavior/Discharge Planning: Goal: Ability to identify changes in lifestyle to reduce recurrence of condition will improve Outcome: Progressing Goal: Identification of resources available to assist in meeting health care needs will improve Outcome: Progressing   Problem: Physical Regulation: Goal: Complications related to the disease process, condition or treatment will be avoided or minimized Outcome: Progressing   Problem: Safety: Goal: Ability to remain free from injury will improve Outcome: Progressing   

## 2019-12-13 NOTE — [facility] Group Notes (Signed)
LCSW Group Therapy Note  12/13/2019 2:01 PM  Type of Therapy/Topic:  Group Therapy:  Feelings about Diagnosis  Participation Level:  Did Not Attend   Description of Group:   This group will allow patients to explore their thoughts and feelings about diagnoses they have received. Patients will be guided to explore their level of understanding and acceptance of these diagnoses. Facilitator will encourage patients to process their thoughts and feelings about the reactions of others to their diagnosis and will guide patients in identifying ways to discuss their diagnosis with significant others in their lives. This group will be process-oriented, with patients participating in exploration of their own experiences, giving and receiving support, and processing challenge from other group members.   Therapeutic Goals: 1. Patient will demonstrate understanding of diagnosis as evidenced by identifying two or more symptoms of the disorder 2. Patient will be able to express two feelings regarding the diagnosis 3. Patient will demonstrate their ability to communicate their needs through discussion and/or role play  Summary of Patient Progress: X  Therapeutic Modalities:   Cognitive Behavioral Therapy Brief Therapy Feelings Identification   Jill Shepherd Jill Shepherd, MSW, LCSW, LCAS 12/13/2019 2:01 PM

## 2019-12-13 NOTE — Progress Notes (Signed)
Recreation Therapy Notes  Date: 12/13/2019  Time: 9:30 am   Location: Craft room  Behavioral response: Appropriate  Intervention Topic: Happiness    Discussion/Intervention:  Group content today was focused on Happiness. The group defined happiness and described where happiness comes from. Individuals identified what makes them happy and how they go about making others happy. Patients expressed things that stop them from being happy and ways they can improve their happiness. The group stated reasons why it is important to be happy. The group participated in the intervention "My Happiness", where they had a chance to identify and express things that make them happy. Clinical Observations/Feedback: Patient came to group late due to unknown reasons. Individual was social with peers and staff while participating in the intervention.  Otilio Groleau LRT/CTRS          Chalise Pe 12/13/2019 12:30 PM

## 2019-12-14 DIAGNOSIS — F319 Bipolar disorder, unspecified: Secondary | ICD-10-CM | POA: Diagnosis not present

## 2019-12-14 NOTE — BHH Counselor (Signed)
CSW contacted [facility] to check on status of referral.  Drema Pry reports that patient has court on 12/7 and 12/8 and patient can not attend until she has an identified attorney.  Penni Homans, MSW, LCSW 12/14/2019 11:50 AM

## 2019-12-14 NOTE — B[facility] Counselor (Signed)
CSW contacted [facility] to follow up regarding question of pt's court case status. Drema Pry was informed that pt does have an attorney. She stated that they needed a lawyer letter. CSW asked her to explain, to which she stated that they needed a letter from the lawyer regarding the court case being addressed. CSW stated that the pt would be notified that this needed to be done. No other concerns expressed. Contact ended without incident.   Vilma Meckel. Algis Greenhouse, MSW, LCSW, LCAS 12/14/2019 2:54 PM

## 2019-12-14 NOTE — Progress Notes (Signed)
D: Pt alert and oriented. Pt rates depression 3/10, hopelessness 5/10, and anxiety 5/10. Pt goal: "Getting to next location." Pt reports energy level as normal and concentration as being good. Pt reports sleep last night as being fair. Pt did receive medications for sleep and did find them helpful. Pt reports experiencing 8/10 abdominal pain, prn meds given. Pt denies experiencing any SI/HI, or AVH at this time.   Pt had c/o restless leg this evening. Night shift will be notified and word will be passed along to MD. Pt reports to be okay and states night meds help but wanted to remember to tell MD tomorrow.   A: Scheduled medications administered to pt, per MD orders. Support and encouragement provided. Frequent verbal contact made. Routine safety checks conducted q15 minutes.   R: No adverse drug reactions noted. Pt verbally contracts for safety at this time. Pt complaint with medications and treatment plan. Pt interacts well with others on the unit. Pt remains safe at this time. Will continue to monitor.

## 2019-12-14 NOTE — Progress Notes (Signed)
[facility] MD Progress Note  12/14/2019 11:37 AM Jill Shepherd  MRN:  [id number]  Principal Problem: Bipolar 1 disorder, depressed (Jill Shepherd) Diagnosis: Principal Problem:   Bipolar 1 disorder, depressed (Jill Shepherd) Active Problems:   Anxiety   Opiate abuse, continuous (Jill Shepherd)   Methamphetamine abuse (Jill Shepherd)   Amphetamine and psychostimulant-induced psychosis with hallucinations Jill Shepherd)  Mr. Slevin is a  24y.o. female who was admitted to the Jill Shepherd unit for psychosis, agitation, suicidal ideations.  Interval History Patient was seen today for re-evaluation.  Nursing reports no events overnight. The patient has no issues with performing ADLs.  Patient has been medication compliant.    Subjective:  On assessment patient reports "I'm feeling anxious". She denies any current physical complaints. She reports  no opioid withdrawal symptoms  She reports slept well overnight, and denies having nightmares. She denies suicidal or homicidal thoughts. She denies any hallucinations today. The patient reports no side effects from medications. She is interested in referral to inpatient substance-abuse treatment program. Patient approved by [facility], but no beds currently available. Referral also sent to [facility]. She notes that she really wants to get long-term treatment and stay sober because substance use has ruined her life. She lost custody of her child, and mentions that she really wants to be able to at least visit him again. She knows that she has to stay clean to have a chance to be in her life. She also speaks about how it has affected her relationship with her mother, and how she can no longer move back in with her.   Labs: no new results for review.  Total Time spent with patient: 30 minutes  Past Psychiatric History: seed H&P  Past Medical History:  Past Medical History:  Diagnosis Date  . Drug abuse (Jill Shepherd)   . Headache   . SAB (spontaneous abortion) 01/19/2014   stillborn at 20.4 wks    Past Surgical History:   Procedure Laterality Date  . INCISION AND DRAINAGE ABSCESS Left 05/07/2019   Procedure: INCISION AND DRAINAGE ABSCESS;  Surgeon: Leafy Ro, MD;  Location: [facility] ORS;  Service: General;  Laterality: Left;   Family History:  Family History  Problem Relation Age of Onset  . Hypertension Father   . Hypertension Paternal Grandfather    Family Psychiatric  History: see H&P  Social History:  Social History   Substance and Sexual Activity  Alcohol Use No  . Alcohol/week: 0.0 standard drinks     Social History   Substance and Sexual Activity  Drug Use Yes  . Types: Other-see comments, Amphetamines, Fentanyl, Heroin   Comment: subutex tx, snorted heroin last on 5/8    Social History   Socioeconomic History  . Marital status: Legally Separated    Spouse name: Not on file  . Number of children: 1  . Years of education: Not on file  . Highest education level: Not on file  Occupational History    Employer: [employer]  Tobacco Use  . Smoking status: Never Smoker  . Smokeless tobacco: Never Used  Vaping Use  . Vaping Use: Never used  Substance and Sexual Activity  . Alcohol use: No    Alcohol/week: 0.0 standard drinks  . Drug use: Yes    Types: Other-see comments, Amphetamines, Fentanyl, Heroin    Comment: subutex tx, snorted heroin last on 5/8  . Sexual activity: Yes    Partners: Male    Birth control/protection: None  Other Topics Concern  . Not on file  Social History  Narrative  . Not on file   Social Determinants of Health   Financial Resource Strain:   . Difficulty of Paying Living Expenses: Not on file  Food Insecurity:   . Worried About Programme researcher, broadcasting/film/video in the Last Year: Not on file  . Ran Out of Food in the Last Year: Not on file  Transportation Needs:   . Lack of Transportation (Medical): Not on file  . Lack of Transportation (Non-Medical): Not on file  Physical Activity:   . Days of Exercise per Week: Not on file  . Minutes of Exercise per Session:  Not on file  Stress:   . Feeling of Stress : Not on file  Social Connections:   . Frequency of Communication with Friends and Family: Not on file  . Frequency of Social Gatherings with Friends and Family: Not on file  . Attends Religious Services: Not on file  . Active Member of Clubs or Organizations: Not on file  . Attends Banker Meetings: Not on file  . Marital Status: Not on file   Additional Social History:                         Sleep: Good  Appetite:  Good  Current Medications: Current Facility-Administered Medications  Medication Dose Route Frequency Provider Last Rate Last Admin  . acetaminophen (TYLENOL) tablet 650 mg  650 mg Oral Q6H PRN Clapacs, Jackquline Denmark, MD   650 mg at 12/14/19 0802  . alum & mag hydroxide-simeth (MAALOX/MYLANTA) 200-200-20 MG/5ML suspension 30 mL  30 mL Oral Q4H PRN Clapacs, John T, MD      . hydrOXYzine (ATARAX/VISTARIL) tablet 50 mg  50 mg Oral TID PRN Clapacs, Jackquline Denmark, MD   50 mg at 12/14/19 0802  . influenza vac split quadrivalent PF (FLUARIX) injection 0.5 mL  0.5 mL Intramuscular Tomorrow-1000 Jill Sans, MD      . magnesium hydroxide (MILK OF MAGNESIA) suspension 30 mL  30 mL Oral Daily PRN Clapacs, John T, MD      . nicotine (NICODERM CQ - dosed in mg/24 hours) patch 21 mg  21 mg Transdermal Q0600 Clapacs, Jackquline Denmark, MD   21 mg at 12/14/19 0802  . QUEtiapine (SEROQUEL) tablet 200 mg  200 mg Oral QHS Jill Sans, MD   200 mg at 12/13/19 2103    Lab Results:  No results found for this or any previous visit (from the past 48 hour(s)).  Blood Alcohol level:  Lab Results  Component Value Date   ETH <10 12/06/2019   ETH <10 12/01/2019    Metabolic Disorder Labs: No results found for: HGBA1C, MPG No results found for: PROLACTIN No results found for: CHOL, TRIG, HDL, CHOLHDL, VLDL, LDLCALC  Physical Findings: AIMS: Facial and Oral Movements Muscles of Facial Expression: None, normal Lips and Perioral Area:  None, normal Jaw: None, normal Tongue: None, normal,Extremity Movements Upper (arms, wrists, hands, fingers): None, normal Lower (legs, knees, ankles, toes): None, normal, Trunk Movements Neck, shoulders, hips: None, normal, Overall Severity Severity of abnormal movements (highest score from questions above): None, normal Incapacitation due to abnormal movements: None, normal Patient's awareness of abnormal movements (rate only patient's report): No Awareness, Dental Status Current problems with teeth and/or dentures?: No Does patient usually wear dentures?: No  CIWA:    COWS:     Musculoskeletal: Strength & Muscle Tone: within normal limits Gait & Station: normal Patient leans: N/A  Psychiatric Specialty  Exam: Physical Exam Vitals and nursing note reviewed.  HENT:     Head: Normocephalic and atraumatic.     Right Ear: External ear normal.     Left Ear: External ear normal.     Nose: Nose normal.     Mouth/Throat:     Mouth: Mucous membranes are moist.     Pharynx: Oropharynx is clear.  Eyes:     Extraocular Movements: Extraocular movements intact.     Conjunctiva/sclera: Conjunctivae normal.     Pupils: Pupils are equal, round, and reactive to light.  Cardiovascular:     Rate and Rhythm: Tachycardia present.     Pulses: Normal pulses.  Pulmonary:     Effort: Pulmonary effort is normal.     Breath sounds: Normal breath sounds.  Abdominal:     General: Abdomen is flat.     Palpations: Abdomen is soft.  Musculoskeletal:        General: No swelling. Normal range of motion.     Cervical back: Normal range of motion and neck supple.  Skin:    General: Skin is warm and dry.  Neurological:     General: No focal deficit present.     Mental Status: She is oriented to person, place, and time.  Psychiatric:        Attention and Perception: Attention and perception normal.        Mood and Affect: Mood is anxious.        Speech: Speech normal.        Behavior: Behavior is  cooperative.        Thought Content: Thought content normal.        Cognition and Memory: Cognition and memory normal.        Judgment: Judgment is impulsive.     Review of Systems  Constitutional: Negative for appetite change and fatigue.  HENT: Negative for rhinorrhea and sore throat.   Eyes: Negative for photophobia and visual disturbance.  Respiratory: Negative for cough and shortness of breath.   Cardiovascular: Negative for chest pain and palpitations.  Gastrointestinal: Negative for constipation, diarrhea, nausea and vomiting.  Endocrine: Negative for cold intolerance and heat intolerance.  Genitourinary: Negative for difficulty urinating and dysuria.  Musculoskeletal: Negative for arthralgias and myalgias.  Skin: Negative for rash and wound.  Allergic/Immunologic: Negative for environmental allergies and food allergies.  Neurological: Negative for dizziness and headaches.  Hematological: Negative for adenopathy. Does not bruise/bleed easily.  Psychiatric/Behavioral: Negative for hallucinations and suicidal ideas. The patient is nervous/anxious.     Blood pressure 107/81, pulse 87, temperature 98.1 F (36.7 C), temperature source Oral, resp. rate 18, height 5\' 1"  (1.549 m), weight 105 kg, last menstrual period 12/01/2019, SpO2 100 %, unknown if currently breastfeeding.Body mass index is 43.74 kg/m.  General Appearance: Casual  Eye Contact:  Good  Speech:  Clear and Coherent and Normal Rate  Volume:  Normal  Mood:  Euthymic  Affect:  Appropriate and Congruent  Thought Process:  Coherent and Goal Directed  Orientation:  Full (Time, Place, and Person)  Thought Content:  Logical  Suicidal Thoughts:  No  Homicidal Thoughts:  No  Memory:  Immediate;   Fair Recent;   Fair Remote;   NA  Judgement:  Fair  Insight:  Fair  Psychomotor Activity:  Normal  Concentration:  Concentration: Fair and Attention Span: Fair  Recall:  F[employer]Fair  Fund of Knowledge:  Fair  Language:  Fair   Akathisia:  No  Handed:  Right  AIMS (if indicated):     Assets:  Communication Skills Desire for Improvement Resilience Social Support  ADL's:  Intact  Cognition:  WNL  Sleep:  Number of Hours: 8     Treatment Plan Summary: Daily contact with patient to assess and evaluate symptoms and progress in treatment and Medication management   Patient is a 24 year old female with the above-stated past psychiatric history who is seen in follow-up.  Chart reviewed. Patient discussed with nursing. Patient reports mood improvement today likely in settings of both - after psychotropic medications were restarted and due to not having symptoms of acute opioid withdrawal currently. Denies unsafe thoughts, denies symptoms of psychosis, reports improved sleep. No side effects from medications reported. The current med combination will be continued without changes today. Patient is interested in referral to inpatient substance-abuse treatment program.   Plan: -continue inpatient psych admission; 15-minute checks; daily contact with patient to assess and evaluate symptoms and progress in treatment; psychoeducation.  -continue scheduled medications: . influenza vac split quadrivalent PF  0.5 mL Intramuscular Tomorrow-1000  . nicotine  21 mg Transdermal Q0600  . QUEtiapine  200 mg Oral QHS    -continue PRN medications.  acetaminophen, alum & mag hydroxide-simeth, hydrOXYzine, magnesium hydroxide  -Pertinent Labs:  HCV Quantitative >50 IU/mL 749,000   HCV Quantitative Log >1.70 log10 IU/mL 5.874   Infectious disease contacted about results, and they advise outpatient follow-up. Will contact [phone] to schedule appointment at discharge.    -EKG: on 11/4 showed Qtc 472 with normal sinus rhythm.   -Disposition: Estimated duration of hospitalization: Late this week pending inpatient substance abuse treatment vs SAIOP. Currently approved for [facility], but no beds available today. All necessary  aftercare will be arranged prior to discharge   -  I certify that the patient does need, on a daily basis, active treatment furnished directly by or requiring the supervision of inpatient psychiatric facility personnel.   Jill Sans, MD 12/14/2019, 11:37 AM

## 2019-12-14 NOTE — BHH Counselor (Signed)
CSW contacted [facility] ([facility] Representative shared that the Patient has been reviewed; awaiting a bed date. Representative expects a bed to be open by Friday 16 Dec 2019.   Casimiro Needle, MSW, [place], LCASA 12/14/2019 11:59 AM

## 2019-12-14 NOTE — B[facility] Counselor (Signed)
CSW contacted [facility] and safety inspector, [facility] program Coordinator [place].  CSW received the voicemail and per the voicemail no [facility] applications will be accepted until 12/26/2019.    CSW left HIPAA compliant message requesting a return call.    CSW unsure of when this policy was put in place as referral was sent the week previously.    Penni Homans, MSW, LCSW 12/14/2019 3:54 PM

## 2019-12-14 NOTE — B[facility] Group Notes (Signed)
LCSW Group Therapy Note  12/14/2019 2:49 PM  Type of Therapy/Topic:  Group Therapy:  Emotion Regulation  Participation Level:  Active   Description of Group:   The purpose of this group is to assist patients in learning to regulate negative emotions and experience positive emotions. Patients will be guided to discuss ways in which they have been vulnerable to their negative emotions. These vulnerabilities will be juxtaposed with experiences of positive emotions or situations, and patients will be challenged to use positive emotions to combat negative ones. Special emphasis will be placed on coping with negative emotions in conflict situations, and patients will process healthy conflict resolution skills.  Therapeutic Goals: 1. Patient will identify two positive emotions or experiences to reflect on in order to balance out negative emotions 2. Patient will label two or more emotions that they find the most difficult to experience 3. Patient will demonstrate positive conflict resolution skills through discussion and/or role plays  Summary of Patient Progress: Patient was an active participant in group. Paitent shared that for her "boredom can lead to substance use". She reports that her anger led to her being hospitalized. She reports that a goal needs to be "stay on meds".   Therapeutic Modalities:   Cognitive Behavioral Therapy Feelings Identification Dialectical Behavioral Therapy  Penni Homans, MSW, LCSW 12/14/2019 2:49 PM

## 2019-12-14 NOTE — B[facility] Counselor (Signed)
CSW contacted [facility] and safety inspector ([facility]) and was informed that the intake coordinator was out for vacation. CSW was also informed that they would have no bed availability until 11/29.  Jill Shepherd. Jill Shepherd, MSW, LCSW, LCAS 12/14/2019 11:58 AM

## 2019-12-14 NOTE — Tx Team (Addendum)
Interdisciplinary Treatment and Diagnostic Plan Update  12/14/2019 Time of Session: 08:30 Jill Shepherd MRN: [id number]  Principal Diagnosis: Bipolar 1 disorder, depressed (HCC)  Secondary Diagnoses: Principal Problem:   Bipolar 1 disorder, depressed (HCC) Active Problems:   Anxiety   Opiate abuse, continuous (HCC)   Methamphetamine abuse (HCC)   Amphetamine and psychostimulant-induced psychosis with hallucinations (HCC)   Current Medications:  Current Facility-Administered Medications  Medication Dose Route Frequency Provider Last Rate Last Admin   acetaminophen (TYLENOL) tablet 650 mg  650 mg Oral Q6H PRN Clapacs, John T, MD   650 mg at 12/14/19 0802   alum & mag hydroxide-simeth (MAALOX/MYLANTA) 200-200-20 MG/5ML suspension 30 mL  30 mL Oral Q4H PRN Clapacs, Jackquline Denmark, MD       hydrOXYzine (ATARAX/VISTARIL) tablet 50 mg  50 mg Oral TID PRN Clapacs, Jackquline Denmark, MD   50 mg at 12/14/19 0802   influenza vac split quadrivalent PF (FLUARIX) injection 0.5 mL  0.5 mL Intramuscular Tomorrow-1000 Jesse Sans, MD       magnesium hydroxide (MILK OF MAGNESIA) suspension 30 mL  30 mL Oral Daily PRN Clapacs, John T, MD       nicotine (NICODERM CQ - dosed in mg/24 hours) patch 21 mg  21 mg Transdermal Q0600 Clapacs, John T, MD   21 mg at 12/14/19 0802   QUEtiapine (SEROQUEL) tablet 200 mg  200 mg Oral QHS Jesse Sans, MD   200 mg at 12/13/19 2103   PTA Medications: Medications Prior to Admission  Medication Sig Dispense Refill Last Dose   hydrOXYzine (ATARAX/VISTARIL) 50 MG tablet Take 1 tablet (50 mg total) by mouth 3 (three) times daily as needed (PRN). (Patient not taking: Reported on 12/07/2019) 30 tablet 1    QUEtiapine (SEROQUEL) 100 MG tablet Take 1 tablet (100 mg total) by mouth at bedtime. 30 tablet 1     Patient Stressors: Marital or family conflict Substance abuse  Patient Strengths: Average or above average intelligence Capable of independent living Communication  skills General fund of knowledge  Treatment Modalities: Medication Management, Group therapy, Case management,  1 to 1 session with clinician, Psychoeducation, Recreational therapy.   Physician Treatment Plan for Primary Diagnosis: Bipolar 1 disorder, depressed (HCC) Long Term Goal(s): Improvement in symptoms so as ready for discharge Improvement in symptoms so as ready for discharge   Short Term Goals: Ability to identify changes in lifestyle to reduce recurrence of condition will improve Ability to verbalize feelings will improve Ability to disclose and discuss suicidal ideas Ability to demonstrate self-control will improve Ability to identify and develop effective coping behaviors will improve Compliance with prescribed medications will improve Ability to identify changes in lifestyle to reduce recurrence of condition will improve Ability to verbalize feelings will improve Ability to disclose and discuss suicidal ideas Ability to demonstrate self-control will improve Ability to identify and develop effective coping behaviors will improve Compliance with prescribed medications will improve Ability to identify triggers associated with substance abuse/mental health issues will improve  Medication Management: Evaluate patient's response, side effects, and tolerance of medication regimen.  Therapeutic Interventions: 1 to 1 sessions, Unit Group sessions and Medication administration.  Evaluation of Outcomes: Progressing  Physician Treatment Plan for Secondary Diagnosis: Principal Problem:   Bipolar 1 disorder, depressed (HCC) Active Problems:   Anxiety   Opiate abuse, continuous (HCC)   Methamphetamine abuse (HCC)   Amphetamine and psychostimulant-induced psychosis with hallucinations (HCC)  Long Term Goal(s): Improvement in symptoms so as ready for discharge  Improvement in symptoms so as ready for discharge   Short Term Goals: Ability to identify changes in lifestyle to reduce  recurrence of condition will improve Ability to verbalize feelings will improve Ability to disclose and discuss suicidal ideas Ability to demonstrate self-control will improve Ability to identify and develop effective coping behaviors will improve Compliance with prescribed medications will improve Ability to identify changes in lifestyle to reduce recurrence of condition will improve Ability to verbalize feelings will improve Ability to disclose and discuss suicidal ideas Ability to demonstrate self-control will improve Ability to identify and develop effective coping behaviors will improve Compliance with prescribed medications will improve Ability to identify triggers associated with substance abuse/mental health issues will improve     Medication Management: Evaluate patient's response, side effects, and tolerance of medication regimen.  Therapeutic Interventions: 1 to 1 sessions, Unit Group sessions and Medication administration.  Evaluation of Outcomes: Progressing   RN Treatment Plan for Primary Diagnosis: Bipolar 1 disorder, depressed (HCC) Long Term Goal(s): Knowledge of disease and therapeutic regimen to maintain health will improve  Short Term Goals: Ability to verbalize frustration and anger appropriately will improve, Ability to participate in decision making will improve and Ability to verbalize feelings will improve  Medication Management: RN will administer medications as ordered by provider, will assess and evaluate patient's response and provide education to patient for prescribed medication. RN will report any adverse and/or side effects to prescribing provider.  Therapeutic Interventions: 1 on 1 counseling sessions, Psychoeducation, Medication administration, Evaluate responses to treatment, Monitor vital signs and CBGs as ordered, Perform/monitor CIWA, COWS, AIMS and Fall Risk screenings as ordered, Perform wound care treatments as ordered.  Evaluation of Outcomes:  Progressing   LCSW Treatment Plan for Primary Diagnosis: Bipolar 1 disorder, depressed (HCC) Long Term Goal(s): Safe transition to appropriate next level of care at discharge, Engage patient in therapeutic group addressing interpersonal concerns.  Short Term Goals: Engage patient in aftercare planning with referrals and resources, Increase social support, Increase ability to appropriately verbalize feelings, Increase emotional regulation, Facilitate patient progression through stages of change regarding substance use diagnoses and concerns and Identify triggers associated with mental health/substance abuse issues  Therapeutic Interventions: Assess for all discharge needs, 1 to 1 time with Social worker, Explore available resources and support systems, Assess for adequacy in community support network, Educate family and significant other(s) on suicide prevention, Complete Psychosocial Assessment, Interpersonal group therapy.  Evaluation of Outcomes: Progressing   Progress in Treatment: Attending groups: No. Participating in groups: No. Taking medication as prescribed: Yes. Toleration medication: Yes. Family/Significant other contact made: Yes, individual(s) contacted:  Sherrill Raring, mother.  Patient understands diagnosis: Yes. Discussing patient identified problems/goals with staff: Yes. Medical problems stabilized or resolved: Yes. Denies suicidal/homicidal ideation: Yes. Issues/concerns per patient self-inventory: No. Other: None  New problem(s) identified: No, Describe:  None  New Short Term/Long Term Goal(s): Detox, emotional regulation, discharge planning, attend group sessions. Update 12/14/19: No changes at this time.  Patient Goals:  "get out of here" Update 12/14/19: No changes at this time.  Discharge Plan or Barriers: Patient unsure of post discharge plans, CSW will follow-up. Provided material to inpatient substance use facilities. Update 12/14/19: Referrals have been  submitted and awaiting response from [facility]. Accepted at [facility] but they have no beds currently. [facility] has no bed availability until the end of the month.   Reason for Continuation of Hospitalization: Anxiety Depression Medication stabilization Withdrawal symptoms   Estimated Length of Stay: 1-7 Days   Recreational  Therapy: Patient Stressors: N/A Patient Goal: Patient will engage in groups without prompting or encouragement from LRT x3 group sessions within 5 recreation therapy group sessions.   Attendees: Patient: 12/14/2019 9:23 AM  Physician: Les Pou, MD 12/14/2019 9:23 AM  Nursing: 12/14/2019 9:23 AM  RN Care Manager: 12/14/2019 9:23 AM  Social Worker: Gwenevere Ghazi, MSW, LCSWA 12/14/2019 9:23 AM  Recreational Therapist:  12/14/2019 9:23 AM  Other: Penni Homans, MSW, LCSW 12/14/2019 9:23 AM  Other: Vilma Meckel. Algis Greenhouse, MSW, [employer], LCAS 12/14/2019 9:23 AM  Other: 12/14/2019 9:23 AM    Scribe for Treatment Team: Glenis Smoker, LCSW 12/14/2019 9:23 AM

## 2019-12-15 DIAGNOSIS — F319 Bipolar disorder, unspecified: Secondary | ICD-10-CM | POA: Diagnosis not present

## 2019-12-15 LAB — IRON AND TIBC
Iron: 76 ug/dL (ref 28–170)
Saturation Ratios: 17 % (ref 10.4–31.8)
TIBC: 452 ug/dL — ABNORMAL HIGH (ref 250–450)
UIBC: 376 ug/dL

## 2019-12-15 MED ORDER — ROPINIROLE HCL 0.25 MG PO TABS
0.2500 mg | ORAL_TABLET | Freq: Every day | ORAL | Status: DC
  Administered 2019-12-15 – 2019-12-22 (×8): 0.25 mg via ORAL
  Filled 2019-12-15 (×9): qty 1

## 2019-12-15 NOTE — Progress Notes (Addendum)
Recreation Therapy Notes  Date: 12/15/2019  Time: 9:30 am   Location: Craft room  Behavioral response: Appropriate  Intervention Topic: Goals   Discussion/Intervention:  Group content on today was focused on goals. Patients described what goals are and how they define goals. Individuals expressed how they go about setting goals and reaching them. The group identified how important goals are and if they make short term goals to reach long term goals. Patients described how many goals they work on at a time and what affects them not reaching their goal. Individuals described how much time they put into planning and obtaining their goals. The group participated in the intervention "My Goal Board" and made personal goal boards to help them achieve their goal.. Clinical Observations/Feedback: Patient came to group and identified her goals as staying clean, getting into rehab and getting her son back into her life. She explained that goals are based off life experiences. Participant stated that it is important for her to make goals so she does not get stuck in a rut. Individual was social with peers and staff while participating in the intervention.  Rajesh Wyss LRT/CTRS         Aasiyah Auerbach 12/15/2019 12:19 PM

## 2019-12-15 NOTE — Plan of Care (Signed)
  Problem: Safety: Goal: Periods of time without injury will increase Outcome: Progressing   

## 2019-12-15 NOTE — Progress Notes (Signed)
Uneventful night, denies all, in no distress. Slept most of the night. Waiting to go to [facility]. Visible in milieu. Encouragement and support provided. Safety checks maintained. Medications given as prescribed. Pt receptive and remains safe on unit with q 15 min checks.,

## 2019-12-15 NOTE — Progress Notes (Signed)
Contacted patient's mother, Jill Shepherd, per request at [phone]. She plans to bring patient cloths today. She expresses concern about patient remaining sober and taking care of her mental health. She notes that Jill Shepherd has already endured a lot including a miscarriage, a still born, the death of a child before the age of [age], and then losing custody of her 22-year-old son. She is hoping that Jill Shepherd will be able to stay clean, regain visitation rights, and stay employed.

## 2019-12-15 NOTE — [facility] Counselor (Signed)
LCSW Group Therapy Note  12/15/2019 3:17 PM  Type of Therapy/Topic:  Group Therapy:  Balance in Life  Participation Level:  Active  Description of Group:    This group will address the concept of balance and how it feels and looks when one is unbalanced. Patients will be encouraged to process areas in their lives that are out of balance and identify reasons for remaining unbalanced. Facilitators will guide patients in utilizing problem-solving interventions to address and correct the stressor making their life unbalanced. Understanding and applying boundaries will be explored and addressed for obtaining and maintaining a balanced life. Patients will be encouraged to explore ways to assertively make their unbalanced needs known to significant others in their lives, using other group members and facilitator for support and feedback.  Therapeutic Goals: 1. Patient will identify two or more emotions or situations they have that consume much of in their lives. 2. Patient will identify signs/triggers that life has become out of balance:  3. Patient will identify two ways to set boundaries in order to achieve balance in their lives:  4. Patient will demonstrate ability to communicate their needs through discussion and/or role plays  Summary of Patient Progress: Patient was an active participant in group. Patient reports that when unbalanced "I'm all over the place and showing up and friends home asking for a shower and some food".  She reports that she plans on establishing boundaries by ending or restricting her social media.    Therapeutic Modalities:   Cognitive Behavioral Therapy Solution-Focused Therapy Assertiveness Training  Penni Homans MSW, LCSW 12/15/2019 3:17 PM

## 2019-12-15 NOTE — Progress Notes (Signed)
[facility] MD Progress Note  12/15/2019 12:25 PM Jill Shepherd  MRN:  [id number]  Principal Problem: Bipolar 1 disorder, depressed (HCC) Diagnosis: Principal Problem:   Bipolar 1 disorder, depressed (HCC) Active Problems:   Anxiety   Opiate abuse, continuous (HCC)   Methamphetamine abuse (HCC)   Amphetamine and psychostimulant-induced psychosis with hallucinations [facility])  Mr. Jill Shepherd is a  24y.o. female who was admitted to the [facility] unit for psychosis, agitation, suicidal ideations.  Interval History Patient was seen today for re-evaluation.  Nursing reports no events overnight. The patient has no issues with performing ADLs.  Patient has been medication compliant.    Subjective:  On assessment patient reports "I'm feeling good". She does report restless legs overnight. She notes when she is in bed she feels the need to constantly move or kick her legs, and feels relief upon movement. She denies any similar symptoms during the day. She reports  no opioid withdrawal symptoms  She reports slept well overnight, and denies having nightmares. She denies suicidal or homicidal thoughts. She denies any hallucinations today. The patient reports no side effects from medications. She is interested in referral to inpatient substance-abuse treatment program. Patient approved by [facility], bed possibly available for tomorrow. Referral also sent to [facility] who are requesting a letter from her lawyer regarding court dates on Dec 7 and 8.  She notes that she really wants to get long-term treatment and stay sober because substance use has ruined her life. She lost custody of her child, and mentions that she really wants to be able to at least visit him again. She knows that she has to stay clean to have a chance to be in her life. She also speaks about how it has affected her relationship with her mother, and how she can no longer move back in with her.  Labs: no new results for review.  Total Time spent with patient: 30  minutes  Past Psychiatric History: seed H&P  Past Medical History:  Past Medical History:  Diagnosis Date  . Drug abuse (HCC)   . Headache   . SAB (spontaneous abortion) 01/19/2014   stillborn at 20.4 wks    Past Surgical History:  Procedure Laterality Date  . INCISION AND DRAINAGE ABSCESS Left 05/07/2019   Procedure: INCISION AND DRAINAGE ABSCESS;  Surgeon: Leafy Ro, MD;  Location: [facility] ORS;  Service: General;  Laterality: Left;   Family History:  Family History  Problem Relation Age of Onset  . Hypertension Father   . Hypertension Paternal Grandfather    Family Psychiatric  History: see H&P  Social History:  Social History   Substance and Sexual Activity  Alcohol Use No  . Alcohol/week: 0.0 standard drinks     Social History   Substance and Sexual Activity  Drug Use Yes  . Types: Other-see comments, Amphetamines, Fentanyl, Heroin   Comment: subutex tx, snorted heroin last on 5/8    Social History   Socioeconomic History  . Marital status: Legally Separated    Spouse name: Not on file  . Number of children: 1  . Years of education: Not on file  . Highest education level: Not on file  Occupational History    Employer: [employer]  Tobacco Use  . Smoking status: Never Smoker  . Smokeless tobacco: Never Used  Vaping Use  . Vaping Use: Never used  Substance and Sexual Activity  . Alcohol use: No    Alcohol/week: 0.0 standard drinks  . Drug use: Yes  Types: Other-see comments, Amphetamines, Fentanyl, Heroin    Comment: subutex tx, snorted heroin last on 5/8  . Sexual activity: Yes    Partners: Male    Birth control/protection: None  Other Topics Concern  . Not on file  Social History Narrative  . Not on file   Social Determinants of Health   Financial Resource Strain:   . Difficulty of Paying Living Expenses: Not on file  Food Insecurity:   . Worried About Programme researcher, broadcasting/film/video in the Last Year: Not on file  . Ran Out of Food in the Last Year:  Not on file  Transportation Needs:   . Lack of Transportation (Medical): Not on file  . Lack of Transportation (Non-Medical): Not on file  Physical Activity:   . Days of Exercise per Week: Not on file  . Minutes of Exercise per Session: Not on file  Stress:   . Feeling of Stress : Not on file  Social Connections:   . Frequency of Communication with Friends and Family: Not on file  . Frequency of Social Gatherings with Friends and Family: Not on file  . Attends Religious Services: Not on file  . Active Member of Clubs or Organizations: Not on file  . Attends Banker Meetings: Not on file  . Marital Status: Not on file   Additional Social History:                         Sleep: Good  Appetite:  Good  Current Medications: Current Facility-Administered Medications  Medication Dose Route Frequency Provider Last Rate Last Admin  . acetaminophen (TYLENOL) tablet 650 mg  650 mg Oral Q6H PRN Clapacs, Jackquline Denmark, MD   650 mg at 12/14/19 1654  . alum & mag hydroxide-simeth (MAALOX/MYLANTA) 200-200-20 MG/5ML suspension 30 mL  30 mL Oral Q4H PRN Clapacs, John T, MD      . hydrOXYzine (ATARAX/VISTARIL) tablet 50 mg  50 mg Oral TID PRN Clapacs, Jackquline Denmark, MD   50 mg at 12/15/19 0806  . influenza vac split quadrivalent PF (FLUARIX) injection 0.5 mL  0.5 mL Intramuscular Tomorrow-1000 Jesse Sans, MD      . magnesium hydroxide (MILK OF MAGNESIA) suspension 30 mL  30 mL Oral Daily PRN Clapacs, John T, MD      . nicotine (NICODERM CQ - dosed in mg/24 hours) patch 21 mg  21 mg Transdermal Q0600 Clapacs, Jackquline Denmark, MD   21 mg at 12/15/19 0806  . QUEtiapine (SEROQUEL) tablet 200 mg  200 mg Oral QHS Jesse Sans, MD   200 mg at 12/14/19 2028  . rOPINIRole (REQUIP) tablet 0.25 mg  0.25 mg Oral QHS Jesse Sans, MD        Lab Results:  No results found for this or any previous visit (from the past 48 hour(s)).  Blood Alcohol level:  Lab Results  Component Value Date   ETH  <10 12/06/2019   ETH <10 12/01/2019    Metabolic Disorder Labs: No results found for: HGBA1C, MPG No results found for: PROLACTIN No results found for: CHOL, TRIG, HDL, CHOLHDL, VLDL, LDLCALC  Physical Findings: AIMS: Facial and Oral Movements Muscles of Facial Expression: None, normal Lips and Perioral Area: None, normal Jaw: None, normal Tongue: None, normal,Extremity Movements Upper (arms, wrists, hands, fingers): None, normal Lower (legs, knees, ankles, toes): None, normal, Trunk Movements Neck, shoulders, hips: None, normal, Overall Severity Severity of abnormal movements (highest score from  questions above): None, normal Incapacitation due to abnormal movements: None, normal Patient's awareness of abnormal movements (rate only patient's report): No Awareness, Dental Status Current problems with teeth and/or dentures?: No Does patient usually wear dentures?: No  CIWA:    COWS:     Musculoskeletal: Strength & Muscle Tone: within normal limits Gait & Station: normal Patient leans: N/A  Psychiatric Specialty Exam: Physical Exam Vitals and nursing note reviewed.  HENT:     Head: Normocephalic and atraumatic.     Right Ear: External ear normal.     Left Ear: External ear normal.     Nose: Nose normal.     Mouth/Throat:     Mouth: Mucous membranes are moist.     Pharynx: Oropharynx is clear.  Eyes:     Extraocular Movements: Extraocular movements intact.     Conjunctiva/sclera: Conjunctivae normal.     Pupils: Pupils are equal, round, and reactive to light.  Cardiovascular:     Rate and Rhythm: Normal rate.     Pulses: Normal pulses.  Pulmonary:     Effort: Pulmonary effort is normal.     Breath sounds: Normal breath sounds.  Abdominal:     General: Abdomen is flat.     Palpations: Abdomen is soft.  Musculoskeletal:        General: No swelling. Normal range of motion.     Cervical back: Normal range of motion and neck supple.  Skin:    General: Skin is warm  and dry.  Neurological:     General: No focal deficit present.     Mental Status: She is oriented to person, place, and time.  Psychiatric:        Attention and Perception: Attention and perception normal.        Mood and Affect: Mood is anxious.        Speech: Speech normal.        Behavior: Behavior is cooperative.        Thought Content: Thought content normal.        Cognition and Memory: Cognition and memory normal.        Judgment: Judgment is impulsive.     Review of Systems  Constitutional: Negative for appetite change and fatigue.  HENT: Negative for rhinorrhea and sore throat.   Eyes: Negative for photophobia and visual disturbance.  Respiratory: Negative for cough and shortness of breath.   Cardiovascular: Negative for chest pain and palpitations.  Gastrointestinal: Negative for constipation, diarrhea, nausea and vomiting.  Endocrine: Negative for cold intolerance and heat intolerance.  Genitourinary: Negative for difficulty urinating and dysuria.  Musculoskeletal: Negative for arthralgias and myalgias.  Skin: Negative for rash and wound.  Allergic/Immunologic: Negative for environmental allergies and food allergies.  Neurological: Negative for dizziness and headaches.  Hematological: Negative for adenopathy. Does not bruise/bleed easily.  Psychiatric/Behavioral: Negative for hallucinations and suicidal ideas. The patient is nervous/anxious.     Blood pressure 99/66, pulse 92, temperature 97.8 F (36.6 C), resp. rate 18, height 5\' 1"  (1.549 m), weight 105 kg, last menstrual period 12/01/2019, SpO2 100 %, unknown if currently breastfeeding.Body mass index is 43.74 kg/m.  General Appearance: Casual  Eye Contact:  Good  Speech:  Clear and Coherent and Normal Rate  Volume:  Normal  Mood:  Euthymic  Affect:  Appropriate and Congruent  Thought Process:  Coherent and Goal Directed  Orientation:  Full (Time, Place, and Person)  Thought Content:  Logical  Suicidal  Thoughts:  No  Homicidal Thoughts:  No  Memory:  Immediate;   Fair Recent;   Fair Remote;   NA  Judgement:  Fair  Insight:  Fair  Psychomotor Activity:  Normal  Concentration:  Concentration: Fair and Attention Span: Fair  Recall:  [employer] of Knowledge:  Fair  Language:  Fair  Akathisia:  No  Handed:  Right  AIMS (if indicated):     Assets:  Communication Skills Desire for Improvement Resilience Social Support  ADL's:  Intact  Cognition:  WNL  Sleep:  Number of Hours: 8     Treatment Plan Summary: Daily contact with patient to assess and evaluate symptoms and progress in treatment and Medication management   Patient is a 24 year old female with the above-stated past psychiatric history who is seen in follow-up.  Chart reviewed. Patient discussed with nursing. Patient reports mood improvement today likely in settings of both - after psychotropic medications were restarted and due to not having symptoms of acute opioid withdrawal currently. Denies unsafe thoughts, denies symptoms of psychosis, reports improved sleep. No side effects from medications reported. The current med combination will be continued. Will check iron levels and start ropinerol 0.25 mg QHS for restless leg syndrome. Patient is interested in referral to inpatient substance-abuse treatment program. Approved by [facility] who may have bed availability on 11/19. Lawyer to fax letter to [facility].    Plan: -continue inpatient psych admission; 15-minute checks; daily contact with patient to assess and evaluate symptoms and progress in treatment; psychoeducation.  -continue scheduled medications: . influenza vac split quadrivalent PF  0.5 mL Intramuscular Tomorrow-1000  . nicotine  21 mg Transdermal Q0600  . QUEtiapine  200 mg Oral QHS  . rOPINIRole  0.25 mg Oral QHS    -continue PRN medications.  acetaminophen, alum & mag hydroxide-simeth, hydrOXYzine, magnesium hydroxide  -Pertinent Labs:  HCV Quantitative >50 IU/mL  749,000   HCV Quantitative Log >1.70 log10 IU/mL 5.874   Infectious disease contacted about results, and they advise outpatient follow-up. Will contact [phone] to schedule appointment at discharge.    -EKG: on 11/4 showed Qtc 472 with normal sinus rhythm.   -Disposition: Estimated duration of hospitalization: Late this week pending inpatient substance abuse treatment vs SAIOP. Currently approved for [facility], but no beds available today. All necessary aftercare will be arranged prior to discharge   -  I certify that the patient does need, on a daily basis, active treatment furnished directly by or requiring the supervision of inpatient psychiatric facility personnel.   Jesse Sans, MD 12/15/2019, 12:25 PM

## 2019-12-15 NOTE — [facility] Counselor (Signed)
CSW met with patient and informed her that her lawyer will need to fax a letter to [facility] stating that she is addressing the patient's court needs.  Patient reports that she will call her lawyer and provide the contact information.   Assunta Curtis, MSW, LCSW 12/15/2019 12:14 PM

## 2019-12-15 NOTE — Plan of Care (Signed)
D- Patient alert and oriented. Patient presented in a pleasant mood on assessment stating that she slept good last night and had no complaints to voice to this Clinical research associate. Patient endorsed both depression/anxiety stating that she is "ready to go home and Thanksgiving is coming up". She also stated that "I dreamed about my kids and brother last night, I'm missing them, and finding a place to go so I can move onto my next stop", is why she feels this way. Patient denies SI, HI, AVH, and pain at this time. Patient had no stated goals for today.  A- Scheduled medications administered to patient, per MD orders. Support and encouragement provided.  Routine safety checks conducted every 15 minutes.  Patient informed to notify staff with problems or concerns.  R- No adverse drug reactions noted. Patient contracts for safety at this time. Patient compliant with medications and treatment plan. Patient receptive, calm, and cooperative. Patient interacts well with others on the unit.  Patient remains safe at this time.  Problem: Education: Goal: Knowledge of [facility] Education information/materials will improve Outcome: Progressing Goal: Emotional status will improve Outcome: Progressing Goal: Mental status will improve Outcome: Progressing Goal: Verbalization of understanding the information provided will improve Outcome: Progressing   Problem: Activity: Goal: Interest or engagement in activities will improve Outcome: Progressing Goal: Sleeping patterns will improve Outcome: Progressing   Problem: Coping: Goal: Ability to verbalize frustrations and anger appropriately will improve Outcome: Progressing Goal: Ability to demonstrate self-control will improve Outcome: Progressing   Problem: Health Behavior/Discharge Planning: Goal: Identification of resources available to assist in meeting health care needs will improve Outcome: Progressing Goal: Compliance with treatment plan for underlying  cause of condition will improve Outcome: Progressing   Problem: Physical Regulation: Goal: Ability to maintain clinical measurements within normal limits will improve Outcome: Progressing   Problem: Safety: Goal: Periods of time without injury will increase Outcome: Progressing   Problem: Education: Goal: Knowledge of disease or condition will improve Outcome: Progressing Goal: Understanding of discharge needs will improve Outcome: Progressing   Problem: Health Behavior/Discharge Planning: Goal: Ability to identify changes in lifestyle to reduce recurrence of condition will improve Outcome: Progressing Goal: Identification of resources available to assist in meeting health care needs will improve Outcome: Progressing   Problem: Physical Regulation: Goal: Complications related to the disease process, condition or treatment will be avoided or minimized Outcome: Progressing   Problem: Safety: Goal: Ability to remain free from injury will improve Outcome: Progressing   Problem: Self-Concept: Goal: Level of anxiety will decrease Outcome: Progressing Goal: Ability to modify response to factors that promote anxiety will improve Outcome: Progressing

## 2019-12-16 DIAGNOSIS — F319 Bipolar disorder, unspecified: Secondary | ICD-10-CM | POA: Diagnosis not present

## 2019-12-16 NOTE — [facility] Counselor (Signed)
CSW called [facility] to check on status of referral.  CSW left HIPAA compliant voicemail.  Penni Homans, MSW, LCSW 12/16/2019 12:57 PM

## 2019-12-16 NOTE — Progress Notes (Signed)
[facility] MD Progress Note  12/16/2019 10:38 AM Jill Shepherd  MRN:  [id number]  Principal Problem: Bipolar 1 disorder, depressed (HCC) Diagnosis: Principal Problem:   Bipolar 1 disorder, depressed (HCC) Active Problems:   Anxiety   Opiate abuse, continuous (HCC)   Methamphetamine abuse (HCC)   Amphetamine and psychostimulant-induced psychosis with hallucinations [facility])  Mr. Sherwood is a  24y.o. female who was admitted to the [facility] unit for psychosis, agitation, suicidal ideations.  Interval History Patient was seen today for re-evaluation.  Nursing reports no events overnight. The patient has no issues with performing ADLs.  Patient has been medication compliant.    Subjective:  On assessment patient reports "I'm feeling good". She does reports improvement in restless legs overnight with Requip 0.25 mg QHS. Iron studies returned within normal limits.  She reports slept well overnight, and denies having nightmares. She denies suicidal or homicidal thoughts. She denies any hallucinations today. The patient reports no side effects from medications. She is interested in referral to inpatient substance-abuse treatment program. Patient approved by [facility], bed possibly available next week. Referral also sent to [facility], and her lawyer has sent letter on her behalf.  She notes that she really wants to get long-term treatment and stay sober because substance use has ruined her life. She lost custody of her child, and mentions that she really wants to be able to at least visit him again. She knows that she has to stay clean to have a chance to be in her life. She also speaks about how it has affected her relationship with her mother, and how she can no longer move back in with her.  Labs: no new results for review.  Total Time spent with patient: 30 minutes  Past Psychiatric History: seed H&P  Past Medical History:  Past Medical History:  Diagnosis Date  . Drug abuse (HCC)   . Headache   . SAB (spontaneous  abortion) 01/19/2014   stillborn at 20.4 wks    Past Surgical History:  Procedure Laterality Date  . INCISION AND DRAINAGE ABSCESS Left 05/07/2019   Procedure: INCISION AND DRAINAGE ABSCESS;  Surgeon: Leafy Ro, MD;  Location: [facility] ORS;  Service: General;  Laterality: Left;   Family History:  Family History  Problem Relation Age of Onset  . Hypertension Father   . Hypertension Paternal Grandfather    Family Psychiatric  History: see H&P  Social History:  Social History   Substance and Sexual Activity  Alcohol Use No  . Alcohol/week: 0.0 standard drinks     Social History   Substance and Sexual Activity  Drug Use Yes  . Types: Other-see comments, Amphetamines, Fentanyl, Heroin   Comment: subutex tx, snorted heroin last on 5/8    Social History   Socioeconomic History  . Marital status: Legally Separated    Spouse name: Not on file  . Number of children: 1  . Years of education: Not on file  . Highest education level: Not on file  Occupational History    Employer: [employer]  Tobacco Use  . Smoking status: Never Smoker  . Smokeless tobacco: Never Used  Vaping Use  . Vaping Use: Never used  Substance and Sexual Activity  . Alcohol use: No    Alcohol/week: 0.0 standard drinks  . Drug use: Yes    Types: Other-see comments, Amphetamines, Fentanyl, Heroin    Comment: subutex tx, snorted heroin last on 5/8  . Sexual activity: Yes    Partners: Male  Birth control/protection: None  Other Topics Concern  . Not on file  Social History Narrative  . Not on file   Social Determinants of Health   Financial Resource Strain:   . Difficulty of Paying Living Expenses: Not on file  Food Insecurity:   . Worried About Programme researcher, broadcasting/film/video in the Last Year: Not on file  . Ran Out of Food in the Last Year: Not on file  Transportation Needs:   . Lack of Transportation (Medical): Not on file  . Lack of Transportation (Non-Medical): Not on file  Physical Activity:   .  Days of Exercise per Week: Not on file  . Minutes of Exercise per Session: Not on file  Stress:   . Feeling of Stress : Not on file  Social Connections:   . Frequency of Communication with Friends and Family: Not on file  . Frequency of Social Gatherings with Friends and Family: Not on file  . Attends Religious Services: Not on file  . Active Member of Clubs or Organizations: Not on file  . Attends Banker Meetings: Not on file  . Marital Status: Not on file   Additional Social History:                         Sleep: Good  Appetite:  Good  Current Medications: Current Facility-Administered Medications  Medication Dose Route Frequency Provider Last Rate Last Admin  . acetaminophen (TYLENOL) tablet 650 mg  650 mg Oral Q6H PRN Clapacs, Jackquline Denmark, MD   650 mg at 12/15/19 1225  . alum & mag hydroxide-simeth (MAALOX/MYLANTA) 200-200-20 MG/5ML suspension 30 mL  30 mL Oral Q4H PRN Clapacs, John T, MD   30 mL at 12/15/19 1949  . hydrOXYzine (ATARAX/VISTARIL) tablet 50 mg  50 mg Oral TID PRN Clapacs, Jackquline Denmark, MD   50 mg at 12/16/19 1033  . influenza vac split quadrivalent PF (FLUARIX) injection 0.5 mL  0.5 mL Intramuscular Tomorrow-1000 Jesse Sans, MD      . magnesium hydroxide (MILK OF MAGNESIA) suspension 30 mL  30 mL Oral Daily PRN Clapacs, John T, MD      . nicotine (NICODERM CQ - dosed in mg/24 hours) patch 21 mg  21 mg Transdermal Q0600 Clapacs, Jackquline Denmark, MD   21 mg at 12/16/19 1033  . QUEtiapine (SEROQUEL) tablet 200 mg  200 mg Oral QHS Jesse Sans, MD   200 mg at 12/15/19 2111  . rOPINIRole (REQUIP) tablet 0.25 mg  0.25 mg Oral QHS Les Pou M, MD   0.25 mg at 12/15/19 2111    Lab Results:  Results for orders placed or performed during the hospital encounter of 12/07/19 (from the past 48 hour(s))  Iron and TIBC     Status: Abnormal   Collection Time: 12/15/19 12:47 PM  Result Value Ref Range   Iron 76 28 - 170 ug/dL   TIBC 382 (H) 505 - 397 ug/dL    Saturation Ratios 17 10.4 - 31.8 %   UIBC 376 ug/dL    Comment: Performed at [facility], [address] [place], [place] [address]    Blood Alcohol level:  Lab Results  Component Value Date   [facility] <10 12/06/2019   ETH <10 12/01/2019    Metabolic Disorder Labs: No results found for: HGBA1C, MPG No results found for: PROLACTIN No results found for: CHOL, TRIG, HDL, CHOLHDL, VLDL, LDLCALC  Physical Findings: AIMS: Facial and Oral  Movements Muscles of Facial Expression: None, normal Lips and Perioral Area: None, normal Jaw: None, normal Tongue: None, normal,Extremity Movements Upper (arms, wrists, hands, fingers): None, normal Lower (legs, knees, ankles, toes): None, normal, Trunk Movements Neck, shoulders, hips: None, normal, Overall Severity Severity of abnormal movements (highest score from questions above): None, normal Incapacitation due to abnormal movements: None, normal Patient's awareness of abnormal movements (rate only patient's report): No Awareness, Dental Status Current problems with teeth and/or dentures?: No Does patient usually wear dentures?: No  CIWA:    COWS:     Musculoskeletal: Strength & Muscle Tone: within normal limits Gait & Station: normal Patient leans: N/A  Psychiatric Specialty Exam: Physical Exam Vitals and nursing note reviewed.  HENT:     Head: Normocephalic and atraumatic.     Right Ear: External ear normal.     Left Ear: External ear normal.     Nose: Nose normal.     Mouth/Throat:     Mouth: Mucous membranes are moist.     Pharynx: Oropharynx is clear.  Eyes:     Extraocular Movements: Extraocular movements intact.     Conjunctiva/sclera: Conjunctivae normal.     Pupils: Pupils are equal, round, and reactive to light.  Cardiovascular:     Rate and Rhythm: Normal rate.     Pulses: Normal pulses.  Pulmonary:     Effort: Pulmonary effort is normal.     Breath sounds: Normal breath sounds.  Abdominal:     General:  Abdomen is flat.     Palpations: Abdomen is soft.  Musculoskeletal:        General: No swelling. Normal range of motion.     Cervical back: Normal range of motion and neck supple.  Skin:    General: Skin is warm and dry.  Neurological:     General: No focal deficit present.     Mental Status: She is oriented to person, place, and time.  Psychiatric:        Attention and Perception: Attention and perception normal.        Mood and Affect: Mood is anxious.        Speech: Speech normal.        Behavior: Behavior is cooperative.        Thought Content: Thought content normal.        Cognition and Memory: Cognition and memory normal.        Judgment: Judgment is impulsive.     Review of Systems  Constitutional: Negative for appetite change and fatigue.  HENT: Negative for rhinorrhea and sore throat.   Eyes: Negative for photophobia and visual disturbance.  Respiratory: Negative for cough and shortness of breath.   Cardiovascular: Negative for chest pain and palpitations.  Gastrointestinal: Negative for constipation, diarrhea, nausea and vomiting.  Endocrine: Negative for cold intolerance and heat intolerance.  Genitourinary: Negative for difficulty urinating and dysuria.  Musculoskeletal: Negative for arthralgias and myalgias.  Skin: Negative for rash and wound.  Allergic/Immunologic: Negative for environmental allergies and food allergies.  Neurological: Negative for dizziness and headaches.  Hematological: Negative for adenopathy. Does not bruise/bleed easily.  Psychiatric/Behavioral: Negative for hallucinations and suicidal ideas. The patient is nervous/anxious.     Blood pressure 106/72, pulse 91, temperature 97.8 F (36.6 C), temperature source Oral, resp. rate 17, height 5\' 1"  (1.549 m), weight 105 kg, last menstrual period 12/01/2019, SpO2 (!) 78 %, unknown if currently breastfeeding.Body mass index is 43.74 kg/m.  General Appearance: Casual  Eye Contact:  Good  Speech:   Clear and Coherent and Normal Rate  Volume:  Normal  Mood:  Euthymic  Affect:  Appropriate and Congruent  Thought Process:  Coherent and Goal Directed  Orientation:  Full (Time, Place, and Person)  Thought Content:  Logical  Suicidal Thoughts:  No  Homicidal Thoughts:  No  Memory:  Immediate;   Fair Recent;   Fair Remote;   NA  Judgement:  Fair  Insight:  Fair  Psychomotor Activity:  Normal  Concentration:  Concentration: Fair and Attention Span: Fair  Recall:  FiservFair  Fund of Knowledge:  Fair  Language:  Fair  Akathisia:  No  Handed:  Right  AIMS (if indicated):     Assets:  Communication Skills Desire for Improvement Resilience Social Support  ADL's:  Intact  Cognition:  WNL  Sleep:  Number of Hours: 8.15     Treatment Plan Summary: Daily contact with patient to assess and evaluate symptoms and progress in treatment and Medication management   Patient is a 24 year old female with the above-stated past psychiatric history who is seen in follow-up.  Chart reviewed. Patient discussed with nursing. Patient reports mood improvement today likely in settings of both - after psychotropic medications were restarted and due to not having symptoms of acute opioid withdrawal currently. Denies unsafe thoughts, denies symptoms of psychosis, reports improved sleep. No side effects from medications reported. The current med combination will be continued. Will check iron levels and start ropinerol 0.25 mg QHS for restless leg syndrome. Patient is interested in referral to inpatient substance-abuse treatment program. Approved by [facility] who may have bed availability early next week. [facility] and pending review.    Plan: -continue inpatient psych admission; 15-minute checks; daily contact with patient to assess and evaluate symptoms and progress in treatment; psychoeducation.  -continue scheduled medications: . influenza vac split quadrivalent PF  0.5 mL Intramuscular  Tomorrow-1000  . nicotine  21 mg Transdermal Q0600  . QUEtiapine  200 mg Oral QHS  . rOPINIRole  0.25 mg Oral QHS    -continue PRN medications.  acetaminophen, alum & mag hydroxide-simeth, hydrOXYzine, magnesium hydroxide  -Pertinent Labs:  HCV Quantitative >50 IU/mL 749,000   HCV Quantitative Log >1.70 log10 IU/mL 5.874   Infectious disease contacted about results, and they advise outpatient follow-up. Will contact [phone] to schedule appointment at discharge.    -EKG: on 11/4 showed Qtc 472 with normal sinus rhythm.   -Disposition: Estimated duration of hospitalization: Late this week pending inpatient substance abuse treatment vs SAIOP. Currently approved for [facility], but no beds available today. All necessary aftercare will be arranged prior to discharge   -  I certify that the patient does need, on a daily basis, active treatment furnished directly by or requiring the supervision of inpatient psychiatric facility personnel.   Jesse SansMegan M Angelito Hopping, MD 12/16/2019, 10:38 AM

## 2019-12-16 NOTE — Progress Notes (Signed)
Recreation Therapy Notes  Date: 12/16/2019  Time: 9:30 am   Location: Craft room  Behavioral response: Appropriate  Intervention Topic: Problem Solving  Discussion/Intervention:  Group content on today was focused on problem solving. The group described what problem solving is. Patients expressed how problems affect them and how they deal with problems. Individuals identified healthy ways to deal with problems. Patients explained what normally happens to them when they do not deal with problems. The group expressed reoccurring problems for them. The group participated in the intervention "Ways to Solve problems" where patients were given a chance to explore different ways to solve problems.  Clinical Observations/Feedback: Patient came to group late due to being with peer support.Individual was social with peers and staff while participating in the intervention.  Jill Shepherd LRT/CTRS         Alireza Pollack 12/16/2019 12:36 PM

## 2019-12-16 NOTE — Progress Notes (Signed)
Pt is alert and oriented to person, place, time and situation. Pt is calm, cooperative, denies suicidal and homicidal ideation, denies hallucinations, denies anxiety and depression, thoughts are organized, eye contact is good, and pt smiles on approach. Pt is up for breakfast, has a good appetite, is appropriately social with peers and staff. No distress noted, none reported, will continue to monitor pt per Q15 minute face checks and monitor for safety and progress.

## 2019-12-16 NOTE — Progress Notes (Signed)
Patient alert and oriented x 4, affect is flat thoughts are organized and coherent no distress noted, she denies SI/HI/AVH interacting appropriately with peers and staff, she is complaint with medication regimen, 15 minutes safety checks maintained will continue to monitor closely.  

## 2019-12-16 NOTE — Progress Notes (Signed)
BRIEF PHARMACY NOTE   This patient attended and participated in Medication Management Group counseling (12/15/19) led by [facility] staff pharmacist.  This interactive class reviews basic information about prescription medications and education on personal responsibility in medication management.  The class also includes general knowledge of 3 main classes of behavioral medications, including antipsychotics, antidepressants, and mood stabilizers.     Patient behavior was appropriate for group setting.   Educational materials sourced from:  "Medication Do's and Don'ts" from WWW.MED-PASS.COM   "Mental Health Medications" from National Institute of Mental Health Https://www.nimh.nih.gov/health/topics/mental-health-medications/index.shtml#part 149856     Naara Kelty M Colsen Modi ,PharmD 12/16/2019 , 7:08 AM 

## 2019-12-16 NOTE — BHH Counselor (Signed)
CSW contacted [facility], per Marcelline Deist there will be no beds until next week.  Penni Homans, MSW, LCSW 12/16/2019 8:17 AM

## 2019-12-16 NOTE — [facility] Group Notes (Signed)
LCSW Group Therapy Note  12/16/2019 2:52 PM  Type of Therapy and Topic:  Group Therapy:  Feelings around Relapse and Recovery  Participation Level:  Active   Description of Group:    Patients in this group will discuss emotions they experience before and after a relapse. They will process how experiencing these feelings, or avoidance of experiencing them, relates to having a relapse. Facilitator will guide patients to explore emotions they have related to recovery. Patients will be encouraged to process which emotions are more powerful. They will be guided to discuss the emotional reaction significant others in their lives may have to their relapse or recovery. Patients will be assisted in exploring ways to respond to the emotions of others without this contributing to a relapse.  Therapeutic Goals: 1. Patient will identify two or more emotions that lead to a relapse for them 2. Patient will identify two emotions that result when they relapse 3. Patient will identify two emotions related to recovery 4. Patient will demonstrate ability to communicate their needs through discussion and/or role plays   Summary of Patient Progress: Patient was and active listener and consistently contributed to various topics being discussed. Patient shared that she recognizes that has a "conflicted" desire to maintain sobriety. Patient shared common feelings post-relapse, reporting that she has negative, self defeating thoughts towards herself. Those feelings were echoed amongst others in the group.    Therapeutic Modalities:   Cognitive Behavioral Therapy Solution-Focused Therapy Assertiveness Training Relapse Prevention Therapy   Gwenevere Ghazi, MSW, [place], [place] 12/16/2019 2:52 PM

## 2019-12-17 DIAGNOSIS — F319 Bipolar disorder, unspecified: Secondary | ICD-10-CM | POA: Diagnosis not present

## 2019-12-17 NOTE — [facility] Group Notes (Signed)
   LCSW Group Therapy Note    12/17/2019: 1:30 PM- 2:15 PM     Type of Therapy and Topic:  Group Therapy:  Overcoming Obstacles     Participation Level:  Active     Description of Group:     In this group patients will be encouraged to explore what they see as obstacles to their own wellness and recovery. They will be guided to discuss their thoughts, feelings, and behaviors related to these obstacles. The group will process together ways to cope with barriers, with attention given to specific choices patients can make. Each patient will be challenged to identify changes they are motivated to make in order to overcome their obstacles. This group will be process-oriented, with patients participating in exploration of their own experiences as well as giving and receiving support and challenge from other group members.     Therapeutic Goals:  1.    Patient will identify personal and current obstacles as they relate to admission.  2.    Patient will identify barriers that currently interfere with their wellness or overcoming obstacles.  3.    Patient will identify feelings, thought process and behaviors related to these barriers.  4.    Patient will identify two changes they are willing to make to overcome these obstacles:        Summary of Patient Progress: Patient checked into group feeling pretty good. Patient stated her biggest obstacle is staying clean. Patient stated that she knows she is doing better than last week. CSW noticed patient is more bubbly and interacting with other patients. Patient stated that her family doesn't always believe that she is trying to stay clean and will make comments that she is going to die if she keeps using drugs. Patient stated that those comments do not help with her recovery. Patient stated being in the hospital helps her meet new people who can help her through recovery and also provide resources.          Therapeutic Modalities:     Cognitive Behavioral Therapy  Solution Focused Therapy  Motivational Interviewing  Relapse Prevention Therapy    Susa Simmonds, LCSWA   12/17/2019

## 2019-12-17 NOTE — Plan of Care (Signed)
Patient is out in the milieu, pleasant and cooperative. Alert and oriented and denying thoughts of self-harm. Denies hallucinations. Patient is active and getting along with peers. She has no sign of distress. Reports that she had a good day and has no concerns. She has not medication concern and reports that appetite is improved. Staff continue to monitor and to provide support.

## 2019-12-17 NOTE — Progress Notes (Signed)
Pt is alert and oriented to person, place, time and situation. Pt is calm, cooperative, denies suicidal and homicidal ideation, denies hallucinations. Pt is pleasant, medication compliant, social with peers and staff, smiles on contact. Pt denies depression, reports increasing anxiety, rates it 8/10 on a 0-10 scale, 10 being worst, identifies triggers for anxiety are thinking about what she will do and where she will go after discharge. Emotional support given. Pt requested and is given PRN medication for anxiety. Will continue to monitor pt per Q16 minute face checks and monitor for safety and progress.

## 2019-12-17 NOTE — Progress Notes (Signed)
Patient alert and oriented x 4, affect is flat thoughts are organized and coherent no distress noted, she denies SI/HI/AVH interacting appropriately with peers and staff, she is complaint with medication regimen, 15 minutes safety checks maintained will continue to monitor closely.  

## 2019-12-17 NOTE — Progress Notes (Addendum)
[facility] MD Progress Note  12/17/2019 10:16 AM Jill Shepherd  MRN:  [id number]  Principal Problem: Bipolar 1 disorder, depressed (HCC) Diagnosis: Principal Problem:   Bipolar 1 disorder, depressed (HCC) Active Problems:   Anxiety   Opiate abuse, continuous (HCC)   Methamphetamine abuse (HCC)   Amphetamine and psychostimulant-induced psychosis with hallucinations [facility])  Jill Shepherd is a  24y.o. female who was admitted to the [facility] unit for psychosis, agitation, suicidal ideations.  Interval History Patient was seen today for re-evaluation.  Nursing reports no events overnight. The patient has no issues with performing ADLs.  Patient has been medication compliant.    Subjective:  On assessment patient reports "I'm feeling good". She does reports improvement in restless legs with Requip 0.25 mg QHSShe reports slept well overnight, and denies having nightmares. She denies suicidal or homicidal thoughts. She denies any hallucinations today. The patient reports no side effects from medications. She is interested in referral to inpatient substance-abuse treatment program. Patient approved by [facility], bed possibly available next week. Referral also sent to [facility], and her lawyer has sent letter on her behalf. She has no questions or concerns today.     Total Time spent with patient: 20 minutes  Past Psychiatric History: seed H&P  Past Medical History:  Past Medical History:  Diagnosis Date  . Drug abuse (HCC)   . Headache   . SAB (spontaneous abortion) 01/19/2014   stillborn at 20.4 wks    Past Surgical History:  Procedure Laterality Date  . INCISION AND DRAINAGE ABSCESS Left 05/07/2019   Procedure: INCISION AND DRAINAGE ABSCESS;  Surgeon: Leafy Ro, MD;  Location: [facility] ORS;  Service: General;  Laterality: Left;   Family History:  Family History  Problem Relation Age of Onset  . Hypertension Father   . Hypertension Paternal Grandfather    Family Psychiatric  History: see H&P  Social  History:  Social History   Substance and Sexual Activity  Alcohol Use No  . Alcohol/week: 0.0 standard drinks     Social History   Substance and Sexual Activity  Drug Use Yes  . Types: Other-see comments, Amphetamines, Fentanyl, Heroin   Comment: subutex tx, snorted heroin last on 5/8    Social History   Socioeconomic History  . Marital status: Legally Separated    Spouse name: Not on file  . Number of children: 1  . Years of education: Not on file  . Highest education level: Not on file  Occupational History    Employer: [employer]  Tobacco Use  . Smoking status: Never Smoker  . Smokeless tobacco: Never Used  Vaping Use  . Vaping Use: Never used  Substance and Sexual Activity  . Alcohol use: No    Alcohol/week: 0.0 standard drinks  . Drug use: Yes    Types: Other-see comments, Amphetamines, Fentanyl, Heroin    Comment: subutex tx, snorted heroin last on 5/8  . Sexual activity: Yes    Partners: Male    Birth control/protection: None  Other Topics Concern  . Not on file  Social History Narrative  . Not on file   Social Determinants of Health   Financial Resource Strain:   . Difficulty of Paying Living Expenses: Not on file  Food Insecurity:   . Worried About Programme researcher, broadcasting/film/video in the Last Year: Not on file  . Ran Out of Food in the Last Year: Not on file  Transportation Needs:   . Lack of Transportation (Medical): Not on file  .  Lack of Transportation (Non-Medical): Not on file  Physical Activity:   . Days of Exercise per Week: Not on file  . Minutes of Exercise per Session: Not on file  Stress:   . Feeling of Stress : Not on file  Social Connections:   . Frequency of Communication with Friends and Family: Not on file  . Frequency of Social Gatherings with Friends and Family: Not on file  . Attends Religious Services: Not on file  . Active Member of Clubs or Organizations: Not on file  . Attends Banker Meetings: Not on file  . Marital  Status: Not on file   Additional Social History:                         Sleep: Good  Appetite:  Good  Current Medications: Current Facility-Administered Medications  Medication Dose Route Frequency Provider Last Rate Last Admin  . acetaminophen (TYLENOL) tablet 650 mg  650 mg Oral Q6H PRN Clapacs, Jackquline Denmark, MD   650 mg at 12/15/19 1225  . alum & mag hydroxide-simeth (MAALOX/MYLANTA) 200-200-20 MG/5ML suspension 30 mL  30 mL Oral Q4H PRN Clapacs, John T, MD   30 mL at 12/15/19 1949  . hydrOXYzine (ATARAX/VISTARIL) tablet 50 mg  50 mg Oral TID PRN Clapacs, Jackquline Denmark, MD   50 mg at 12/17/19 0819  . influenza vac split quadrivalent PF (FLUARIX) injection 0.5 mL  0.5 mL Intramuscular Tomorrow-1000 Jesse Sans, MD      . magnesium hydroxide (MILK OF MAGNESIA) suspension 30 mL  30 mL Oral Daily PRN Clapacs, John T, MD      . nicotine (NICODERM CQ - dosed in mg/24 hours) patch 21 mg  21 mg Transdermal Q0600 Clapacs, Jackquline Denmark, MD   21 mg at 12/17/19 0819  . QUEtiapine (SEROQUEL) tablet 200 mg  200 mg Oral QHS Jesse Sans, MD   200 mg at 12/16/19 2102  . rOPINIRole (REQUIP) tablet 0.25 mg  0.25 mg Oral QHS Jesse Sans, MD   0.25 mg at 12/16/19 2102    Lab Results:  Results for orders placed or performed during the hospital encounter of 12/07/19 (from the past 48 hour(s))  Iron and TIBC     Status: Abnormal   Collection Time: 12/15/19 12:47 PM  Result Value Ref Range   Iron 76 28 - 170 ug/dL   TIBC 161 (H) 096 - 045 ug/dL   Saturation Ratios 17 10.4 - 31.8 %   UIBC 376 ug/dL    Comment: Performed at [facility], [address] [place], [place] [address]    Blood Alcohol level:  Lab Results  Component Value Date   [facility] <10 12/06/2019   ETH <10 12/01/2019    Metabolic Disorder Labs: No results found for: HGBA1C, MPG No results found for: PROLACTIN No results found for: CHOL, TRIG, HDL, CHOLHDL, VLDL, LDLCALC  Physical Findings: AIMS: Facial and Oral  Movements Muscles of Facial Expression: None, normal Lips and Perioral Area: None, normal Jaw: None, normal Tongue: None, normal,Extremity Movements Upper (arms, wrists, hands, fingers): None, normal Lower (legs, knees, ankles, toes): None, normal, Trunk Movements Neck, shoulders, hips: None, normal, Overall Severity Severity of abnormal movements (highest score from questions above): None, normal Incapacitation due to abnormal movements: None, normal Patient's awareness of abnormal movements (rate only patient's report): No Awareness, Dental Status Current problems with teeth and/or dentures?: No Does patient usually wear dentures?: No  CIWA:  COWS:     Musculoskeletal: Strength & Muscle Tone: within normal limits Gait & Station: normal Patient leans: N/A  Psychiatric Specialty Exam: Physical Exam Vitals and nursing note reviewed.  HENT:     Head: Normocephalic and atraumatic.     Right Ear: External ear normal.     Left Ear: External ear normal.     Nose: Nose normal.     Mouth/Throat:     Mouth: Mucous membranes are moist.     Pharynx: Oropharynx is clear.  Eyes:     Extraocular Movements: Extraocular movements intact.     Conjunctiva/sclera: Conjunctivae normal.     Pupils: Pupils are equal, round, and reactive to light.  Cardiovascular:     Rate and Rhythm: Normal rate.     Pulses: Normal pulses.  Pulmonary:     Effort: Pulmonary effort is normal.     Breath sounds: Normal breath sounds.  Abdominal:     General: Abdomen is flat.     Palpations: Abdomen is soft.  Musculoskeletal:        General: No swelling. Normal range of motion.     Cervical back: Normal range of motion and neck supple.  Skin:    General: Skin is warm and dry.  Neurological:     General: No focal deficit present.     Mental Status: She is oriented to person, place, and time.  Psychiatric:        Attention and Perception: Attention and perception normal.        Mood and Affect: Mood is  anxious.        Speech: Speech normal.        Behavior: Behavior is cooperative.        Thought Content: Thought content normal.        Cognition and Memory: Cognition and memory normal.        Judgment: Judgment is impulsive.     Review of Systems  Constitutional: Negative for appetite change and fatigue.  HENT: Negative for rhinorrhea and sore throat.   Eyes: Negative for photophobia and visual disturbance.  Respiratory: Negative for cough and shortness of breath.   Cardiovascular: Negative for chest pain and palpitations.  Gastrointestinal: Negative for constipation, diarrhea, nausea and vomiting.  Endocrine: Negative for cold intolerance and heat intolerance.  Genitourinary: Negative for difficulty urinating and dysuria.  Musculoskeletal: Negative for arthralgias and myalgias.  Skin: Negative for rash and wound.  Allergic/Immunologic: Negative for environmental allergies and food allergies.  Neurological: Negative for dizziness and headaches.  Hematological: Negative for adenopathy. Does not bruise/bleed easily.  Psychiatric/Behavioral: Negative for hallucinations and suicidal ideas. The patient is nervous/anxious.     Blood pressure 105/68, pulse 96, temperature 98 F (36.7 C), temperature source Oral, resp. rate 18, height 5\' 1"  (1.549 m), weight 105 kg, last menstrual period 12/01/2019, SpO2 100 %, unknown if currently breastfeeding.Body mass index is 43.74 kg/m.  General Appearance: Casual  Eye Contact:  Good  Speech:  Clear and Coherent and Normal Rate  Volume:  Normal  Mood:  Euthymic  Affect:  Appropriate and Congruent  Thought Process:  Coherent and Goal Directed  Orientation:  Full (Time, Place, and Person)  Thought Content:  Logical  Suicidal Thoughts:  No  Homicidal Thoughts:  No  Memory:  Immediate;   Fair Recent;   Fair Remote;   NA  Judgement:  Fair  Insight:  Fair  Psychomotor Activity:  Normal  Concentration:  Concentration: Fair and Attention Span:  Fair  Recall:  Jennelle Human of Knowledge:  Fair  Language:  Fair  Akathisia:  No  Handed:  Right  AIMS (if indicated):     Assets:  Communication Skills Desire for Improvement Resilience Social Support  ADL's:  Intact  Cognition:  WNL  Sleep:  Number of Hours: 7.5     Treatment Plan Summary: Daily contact with patient to assess and evaluate symptoms and progress in treatment and Medication management   Patient is a 25 year old female with the above-stated past psychiatric history who is seen in follow-up.  Chart reviewed. Patient discussed with nursing. Patient reports mood improvement today likely in settings of both - after psychotropic medications were restarted and due to not having symptoms of acute opioid withdrawal currently. Denies unsafe thoughts, denies symptoms of psychosis, reports improved sleep. No side effects from medications reported. The current med combination will be continued. Will check iron levels and start ropinerol 0.25 mg QHS for restless leg syndrome. Patient is interested in referral to inpatient substance-abuse treatment program. Approved by [facility] who may have bed availability early next week. Lawyer faxed letter to [facility], and pending review.    Plan: -continue inpatient psych admission; 15-minute checks; daily contact with patient to assess and evaluate symptoms and progress in treatment; psychoeducation.  -continue scheduled medications: . influenza vac split quadrivalent PF  0.5 mL Intramuscular Tomorrow-1000  . nicotine  21 mg Transdermal Q0600  . QUEtiapine  200 mg Oral QHS  . rOPINIRole  0.25 mg Oral QHS    -continue PRN medications.  acetaminophen, alum & mag hydroxide-simeth, hydrOXYzine, magnesium hydroxide  -Pertinent Labs:  HCV Quantitative >50 IU/mL 749,000   HCV Quantitative Log >1.70 log10 IU/mL 5.874   Infectious disease contacted about results, and they advise outpatient follow-up. Will contact [phone] to schedule appointment at  discharge.    -EKG: on 11/4 showed Qtc 472 with normal sinus rhythm.   -Disposition: Estimated duration of hospitalization: Late next week pending inpatient substance abuse treatment. Currently approved for [facility], but no beds available today. All necessary aftercare will be arranged prior to discharge   -  I certify that the patient does need, on a daily basis, active treatment furnished directly by or requiring the supervision of inpatient psychiatric facility personnel.   Jesse Sans, MD 12/17/2019, 10:16 AM

## 2019-12-18 DIAGNOSIS — F319 Bipolar disorder, unspecified: Secondary | ICD-10-CM | POA: Diagnosis not present

## 2019-12-18 NOTE — [facility] Group Notes (Signed)
LCSW Group Therapy Note     12/18/2019:  1:00 PM- 1:54 PM                 Type of Therapy and Topic:  Group Therapy: Establishing Boundaries    Participation Level:  Active     Description of Group:    In this group, patients learned how to define boundaries, discussed the different types or boundaries with examples.  They identified times that boundaries had been violated and how they reacted.  They analyzed how their reaction was possibly beneficial and how it was possibly unhelpful.  The group discussed how to set boundaries, respect others boundaries and communicate their boundaries. The group utilized a role play scenarios (working with a partner) and discussed how each person in the scenario could have reacted differently and what boundaries they need to implement to improve their life. Patients also discussed consequences to overstepping boundaries and lack of boundaries. Patients discussed how to establish boundaries with clear consequences. Patients will explore discussion questions that address media influence and why it is hard to set boundaries.       Therapeutic Goals:   Patients will define boundaries and explore (physical, personal space and language boundaries).  Patients will remember their last incident where their boundaries were violated and how they behaved.  Patients will practice empathy and understanding of other's boundaries and learn from others in group.  Patients will explore how they may have crossed another person's boundaries in the past.   Patients will learn healthy ways to set and communicate boundaries.  Patients will actively engage in group activity utilizing role play and critical thinking skills.      Summary of Patient Progress:  Patient came into group late. Patient stated that she is currently going through some boundary issues with her mother. Patient was able to relate to the scenarios presented. Patient stated that she let one of her friends  move in with her and they ended up getting in trouble with the law. Patient stated that she ended up losing her apartment and that she should've set boundaries with that person. Patient stated currently its hard to see her mother being a parent because they used drugs together in the past. Patient also stated that her sister is currently not speaking with her as well. Patient plans on communicating better and setting boundaries.     Therapeutic Modalities:    Cognitive Behavioral Therapy      Susa Simmonds, Theresia Majors   12/18/2019

## 2019-12-18 NOTE — Plan of Care (Signed)
Has remained pleasant and cooperative and active in the milieu. Alert and oriented x 4. Denies thoughts of self harm. Denies hallucinations. Reports that her appetite is good. Has no sign of distress. Encouragements provided and safety maintained.

## 2019-12-18 NOTE — Plan of Care (Signed)
D- Patient alert and oriented. Patient presents in a pleasant mood on assessment stating that she slept "good" last night and had no complaints to voice to this Clinical research associate. Patient endorsed both depression and anxiety, stating that she is "ready to go home". Patient denies SI, HI, AVH, and pain at this time. Patient had no stated goals for today.  A- Scheduled medications administered to patient, per MD orders. Support and encouragement provided.  Routine safety checks conducted every 15 minutes.  Patient informed to notify staff with problems or concerns.  R- No adverse drug reactions noted. Patient contracts for safety at this time. Patient compliant with medications and treatment plan. Patient receptive, calm, and cooperative. Patient interacts well with others on the unit.  Patient remains safe at this time.  Problem: Education: Goal: Knowledge of [facility] Education information/materials will improve Outcome: Progressing Goal: Emotional status will improve Outcome: Progressing Goal: Mental status will improve Outcome: Progressing Goal: Verbalization of understanding the information provided will improve Outcome: Progressing   Problem: Activity: Goal: Interest or engagement in activities will improve Outcome: Progressing Goal: Sleeping patterns will improve Outcome: Progressing   Problem: Coping: Goal: Ability to verbalize frustrations and anger appropriately will improve Outcome: Progressing Goal: Ability to demonstrate self-control will improve Outcome: Progressing   Problem: Health Behavior/Discharge Planning: Goal: Identification of resources available to assist in meeting health care needs will improve Outcome: Progressing Goal: Compliance with treatment plan for underlying cause of condition will improve Outcome: Progressing   Problem: Physical Regulation: Goal: Ability to maintain clinical measurements within normal limits will improve Outcome: Progressing    Problem: Safety: Goal: Periods of time without injury will increase Outcome: Progressing   Problem: Education: Goal: Knowledge of disease or condition will improve Outcome: Progressing Goal: Understanding of discharge needs will improve Outcome: Progressing   Problem: Health Behavior/Discharge Planning: Goal: Ability to identify changes in lifestyle to reduce recurrence of condition will improve Outcome: Progressing Goal: Identification of resources available to assist in meeting health care needs will improve Outcome: Progressing   Problem: Physical Regulation: Goal: Complications related to the disease process, condition or treatment will be avoided or minimized Outcome: Progressing   Problem: Safety: Goal: Ability to remain free from injury will improve Outcome: Progressing   Problem: Self-Concept: Goal: Level of anxiety will decrease Outcome: Progressing Goal: Ability to modify response to factors that promote anxiety will improve Outcome: Progressing

## 2019-12-18 NOTE — Progress Notes (Signed)
[facility] MD Progress Note  12/18/2019 12:02 PM Jill Shepherd  MRN:  [id number]  Principal Problem: Bipolar 1 disorder, depressed (HCC) Diagnosis: Principal Problem:   Bipolar 1 disorder, depressed (HCC) Active Problems:   Anxiety   Opiate abuse, continuous (HCC)   Methamphetamine abuse (HCC)   Amphetamine and psychostimulant-induced psychosis with hallucinations [facility])  Mr. Heasley is a  24y.o. female who was admitted to the [facility] unit for psychosis, agitation, suicidal ideations.  Interval History Patient was seen today for re-evaluation.  Nursing reports no events overnight. The patient has no issues with performing ADLs.  Patient has been medication compliant.    Subjective:  On assessment patient reports "I'm feeling good". She reports slept well overnight, and denies having nightmares. She denies suicidal or homicidal thoughts. She denies any hallucinations today. The patient reports no side effects from medications. She is interested in referral to inpatient substance-abuse treatment program. Patient approved by [facility], bed possibly available next week. Referral also sent to [facility], and her lawyer has sent letter on her behalf. She has no questions or concerns today.     Total Time spent with patient: 20 minutes  Past Psychiatric History: seed H&P  Past Medical History:  Past Medical History:  Diagnosis Date  . Drug abuse (HCC)   . Headache   . SAB (spontaneous abortion) 01/19/2014   stillborn at 20.4 wks    Past Surgical History:  Procedure Laterality Date  . INCISION AND DRAINAGE ABSCESS Left 05/07/2019   Procedure: INCISION AND DRAINAGE ABSCESS;  Surgeon: Leafy Ro, MD;  Location: [facility] ORS;  Service: General;  Laterality: Left;   Family History:  Family History  Problem Relation Age of Onset  . Hypertension Father   . Hypertension Paternal Grandfather    Family Psychiatric  History: see H&P  Social History:  Social History   Substance and Sexual Activity  Alcohol  Use No  . Alcohol/week: 0.0 standard drinks     Social History   Substance and Sexual Activity  Drug Use Yes  . Types: Other-see comments, Amphetamines, Fentanyl, Heroin   Comment: subutex tx, snorted heroin last on 5/8    Social History   Socioeconomic History  . Marital status: Legally Separated    Spouse name: Not on file  . Number of children: 1  . Years of education: Not on file  . Highest education level: Not on file  Occupational History    Employer: [employer]  Tobacco Use  . Smoking status: Never Smoker  . Smokeless tobacco: Never Used  Vaping Use  . Vaping Use: Never used  Substance and Sexual Activity  . Alcohol use: No    Alcohol/week: 0.0 standard drinks  . Drug use: Yes    Types: Other-see comments, Amphetamines, Fentanyl, Heroin    Comment: subutex tx, snorted heroin last on 5/8  . Sexual activity: Yes    Partners: Male    Birth control/protection: None  Other Topics Concern  . Not on file  Social History Narrative  . Not on file   Social Determinants of Health   Financial Resource Strain:   . Difficulty of Paying Living Expenses: Not on file  Food Insecurity:   . Worried About Programme researcher, broadcasting/film/video in the Last Year: Not on file  . Ran Out of Food in the Last Year: Not on file  Transportation Needs:   . Lack of Transportation (Medical): Not on file  . Lack of Transportation (Non-Medical): Not on file  Physical Activity:   .  Days of Exercise per Week: Not on file  . Minutes of Exercise per Session: Not on file  Stress:   . Feeling of Stress : Not on file  Social Connections:   . Frequency of Communication with Friends and Family: Not on file  . Frequency of Social Gatherings with Friends and Family: Not on file  . Attends Religious Services: Not on file  . Active Member of Clubs or Organizations: Not on file  . Attends Banker Meetings: Not on file  . Marital Status: Not on file   Additional Social History:                          Sleep: Good  Appetite:  Good  Current Medications: Current Facility-Administered Medications  Medication Dose Route Frequency Provider Last Rate Last Admin  . acetaminophen (TYLENOL) tablet 650 mg  650 mg Oral Q6H PRN Clapacs, Jackquline Denmark, MD   650 mg at 12/17/19 1705  . alum & mag hydroxide-simeth (MAALOX/MYLANTA) 200-200-20 MG/5ML suspension 30 mL  30 mL Oral Q4H PRN Clapacs, John T, MD   30 mL at 12/15/19 1949  . hydrOXYzine (ATARAX/VISTARIL) tablet 50 mg  50 mg Oral TID PRN Clapacs, Jackquline Denmark, MD   50 mg at 12/18/19 0817  . influenza vac split quadrivalent PF (FLUARIX) injection 0.5 mL  0.5 mL Intramuscular Tomorrow-1000 Jesse Sans, MD      . magnesium hydroxide (MILK OF MAGNESIA) suspension 30 mL  30 mL Oral Daily PRN Clapacs, John T, MD      . nicotine (NICODERM CQ - dosed in mg/24 hours) patch 21 mg  21 mg Transdermal Q0600 Clapacs, Jackquline Denmark, MD   21 mg at 12/18/19 0817  . QUEtiapine (SEROQUEL) tablet 200 mg  200 mg Oral QHS Jesse Sans, MD   200 mg at 12/17/19 2113  . rOPINIRole (REQUIP) tablet 0.25 mg  0.25 mg Oral QHS Jesse Sans, MD   0.25 mg at 12/17/19 2113    Lab Results:  No results found for this or any previous visit (from the past 48 hour(s)).  Blood Alcohol level:  Lab Results  Component Value Date   ETH <10 12/06/2019   ETH <10 12/01/2019    Metabolic Disorder Labs: No results found for: HGBA1C, MPG No results found for: PROLACTIN No results found for: CHOL, TRIG, HDL, CHOLHDL, VLDL, LDLCALC  Physical Findings: AIMS: Facial and Oral Movements Muscles of Facial Expression: None, normal Lips and Perioral Area: None, normal Jaw: None, normal Tongue: None, normal,Extremity Movements Upper (arms, wrists, hands, fingers): None, normal Lower (legs, knees, ankles, toes): None, normal, Trunk Movements Neck, shoulders, hips: None, normal, Overall Severity Severity of abnormal movements (highest score from questions above): None,  normal Incapacitation due to abnormal movements: None, normal Patient's awareness of abnormal movements (rate only patient's report): No Awareness, Dental Status Current problems with teeth and/or dentures?: No Does patient usually wear dentures?: No  CIWA:    COWS:     Musculoskeletal: Strength & Muscle Tone: within normal limits Gait & Station: normal Patient leans: N/A  Psychiatric Specialty Exam: Physical Exam Vitals and nursing note reviewed.  HENT:     Head: Normocephalic and atraumatic.     Right Ear: External ear normal.     Left Ear: External ear normal.     Nose: Nose normal.     Mouth/Throat:     Mouth: Mucous membranes are moist.     Pharynx:  Oropharynx is clear.  Eyes:     Extraocular Movements: Extraocular movements intact.     Conjunctiva/sclera: Conjunctivae normal.     Pupils: Pupils are equal, round, and reactive to light.  Cardiovascular:     Rate and Rhythm: Normal rate.     Pulses: Normal pulses.  Pulmonary:     Effort: Pulmonary effort is normal.     Breath sounds: Normal breath sounds.  Abdominal:     General: Abdomen is flat.     Palpations: Abdomen is soft.  Musculoskeletal:        General: No swelling. Normal range of motion.     Cervical back: Normal range of motion and neck supple.  Skin:    General: Skin is warm and dry.  Neurological:     General: No focal deficit present.     Mental Status: She is oriented to person, place, and time.  Psychiatric:        Attention and Perception: Attention and perception normal.        Mood and Affect: Mood is anxious.        Speech: Speech normal.        Behavior: Behavior is cooperative.        Thought Content: Thought content normal.        Cognition and Memory: Cognition and memory normal.        Judgment: Judgment is impulsive.     Review of Systems  Constitutional: Negative for appetite change and fatigue.  HENT: Negative for rhinorrhea and sore throat.   Eyes: Negative for photophobia and  visual disturbance.  Respiratory: Negative for cough and shortness of breath.   Cardiovascular: Negative for chest pain and palpitations.  Gastrointestinal: Negative for constipation, diarrhea, nausea and vomiting.  Endocrine: Negative for cold intolerance and heat intolerance.  Genitourinary: Negative for difficulty urinating and dysuria.  Musculoskeletal: Negative for arthralgias and myalgias.  Skin: Negative for rash and wound.  Allergic/Immunologic: Negative for environmental allergies and food allergies.  Neurological: Negative for dizziness and headaches.  Hematological: Negative for adenopathy. Does not bruise/bleed easily.  Psychiatric/Behavioral: Negative for hallucinations and suicidal ideas. The patient is nervous/anxious.     Blood pressure 104/69, pulse 93, temperature (!) 97.5 F (36.4 C), temperature source Oral, resp. rate 18, height 5\' 1"  (1.549 m), weight 105 kg, last menstrual period 12/01/2019, SpO2 100 %, unknown if currently breastfeeding.Body mass index is 43.74 kg/m.  General Appearance: Casual  Eye Contact:  Good  Speech:  Clear and Coherent and Normal Rate  Volume:  Normal  Mood:  Euthymic  Affect:  Appropriate and Congruent  Thought Process:  Coherent and Goal Directed  Orientation:  Full (Time, Place, and Person)  Thought Content:  Logical  Suicidal Thoughts:  No  Homicidal Thoughts:  No  Memory:  Immediate;   Fair Recent;   Fair Remote;   NA  Judgement:  Fair  Insight:  Fair  Psychomotor Activity:  Normal  Concentration:  Concentration: Fair and Attention Span: Fair  Recall:  FiservFair  Fund of Knowledge:  Fair  Language:  Fair  Akathisia:  No  Handed:  Right  AIMS (if indicated):     Assets:  Communication Skills Desire for Improvement Resilience Social Support  ADL's:  Intact  Cognition:  WNL  Sleep:  Number of Hours: 7.5     Treatment Plan Summary: Daily contact with patient to assess and evaluate symptoms and progress in treatment and  Medication management   Patient is a 24 year old  female with the above-stated past psychiatric history who is seen in follow-up.  Chart reviewed. Patient discussed with nursing. Patient reports mood improvement today likely in settings of both - after psychotropic medications were restarted and due to not having symptoms of acute opioid withdrawal currently. Denies unsafe thoughts, denies symptoms of psychosis, reports improved sleep. No side effects from medications reported. The current med combination will be continued. Will check iron levels and start ropinerol 0.25 mg QHS for restless leg syndrome. Patient is interested in referral to inpatient substance-abuse treatment program. Approved by [facility] who may have bed availability early next week. Lawyer faxed letter to [facility], and pending review.    Plan: -continue inpatient psych admission; 15-minute checks; daily contact with patient to assess and evaluate symptoms and progress in treatment; psychoeducation.  -continue scheduled medications: . influenza vac split quadrivalent PF  0.5 mL Intramuscular Tomorrow-1000  . nicotine  21 mg Transdermal Q0600  . QUEtiapine  200 mg Oral QHS  . rOPINIRole  0.25 mg Oral QHS    -continue PRN medications.  acetaminophen, alum & mag hydroxide-simeth, hydrOXYzine, magnesium hydroxide  -Pertinent Labs:  HCV Quantitative >50 IU/mL 749,000   HCV Quantitative Log >1.70 log10 IU/mL 5.874   Infectious disease contacted about results, and they advise outpatient follow-up. Will contact [phone] to schedule appointment at discharge.    -EKG: on 11/4 showed Qtc 472 with normal sinus rhythm.   -Disposition: Estimated duration of hospitalization: Late next week pending inpatient substance abuse treatment. Currently approved for [facility], but no beds available today. All necessary aftercare will be arranged prior to discharge   -  I certify that the patient does need, on a daily basis, active treatment furnished  directly by or requiring the supervision of inpatient psychiatric facility personnel.   Jesse Sans, MD 12/18/2019, 12:02 PM

## 2019-12-19 DIAGNOSIS — F319 Bipolar disorder, unspecified: Secondary | ICD-10-CM | POA: Diagnosis not present

## 2019-12-19 NOTE — BHH Counselor (Signed)
CSW called [facility] to check on status of referral.  CSW was informed that Shayla that does the intake is not in.  CSW was told that staff would check the system and return this CSWs call.  At the time of this update, CSW has not been contacted.   Penni Homans, MSW, LCSW 12/19/2019 12:37 PM

## 2019-12-19 NOTE — [facility] Group Notes (Signed)
LCSW Group Therapy Note   12/19/2019 2:22 PM  Type of Therapy and Topic:  Group Therapy:  Overcoming Obstacles   Participation Level:  Active   Description of Group:    In this group patients will be encouraged to explore what they see as obstacles to their own wellness and recovery. They will be guided to discuss their thoughts, feelings, and behaviors related to these obstacles. The group will process together ways to cope with barriers, with attention given to specific choices patients can make. Each patient will be challenged to identify changes they are motivated to make in order to overcome their obstacles. This group will be process-oriented, with patients participating in exploration of their own experiences as well as giving and receiving support and challenge from other group members.   Therapeutic Goals: 1. Patient will identify personal and current obstacles as they relate to admission. 2. Patient will identify barriers that currently interfere with their wellness or overcoming obstacles.  3. Patient will identify feelings, thought process and behaviors related to these barriers. 4. Patient will identify two changes they are willing to make to overcome these obstacles:      Summary of Patient Progress Pt was active and involved in the conversation. She was able to identify her addiction as an obstacle related to admission. Pt spoke about grief/loss, herself, and environmental factors as barriers contributing to lack of wellness. She spoke about the need to examine her part in the issues that she is facing, realizing that this is a process, and needing to do a self-inventory. Pt acknowledged that she has issues with remaining in recovery once outside of a facility.    Therapeutic Modalities:   Cognitive Behavioral Therapy Solution Focused Therapy Motivational Interviewing Relapse Prevention Therapy  Simona Huh R. Algis Greenhouse, MSW, LCSW, LCAS 12/19/2019 2:22 PM

## 2019-12-19 NOTE — Tx Team (Signed)
Interdisciplinary Treatment and Diagnostic Plan Update  12/19/2019 Time of Session: 08:30 Jill Shepherd MRN: [id number]  Principal Diagnosis: Bipolar 1 disorder, depressed (HCC)  Secondary Diagnoses: Principal Problem:   Bipolar 1 disorder, depressed (HCC) Active Problems:   Anxiety   Opiate abuse, continuous (HCC)   Methamphetamine abuse (HCC)   Amphetamine and psychostimulant-induced psychosis with hallucinations (HCC)   Current Medications:  Current Facility-Administered Medications  Medication Dose Route Frequency Provider Last Rate Last Admin  . acetaminophen (TYLENOL) tablet 650 mg  650 mg Oral Q6H PRN Clapacs, Jackquline Denmark, MD   650 mg at 12/17/19 1705  . alum & mag hydroxide-simeth (MAALOX/MYLANTA) 200-200-20 MG/5ML suspension 30 mL  30 mL Oral Q4H PRN Clapacs, John T, MD   30 mL at 12/15/19 1949  . hydrOXYzine (ATARAX/VISTARIL) tablet 50 mg  50 mg Oral TID PRN Clapacs, Jackquline Denmark, MD   50 mg at 12/19/19 0816  . influenza vac split quadrivalent PF (FLUARIX) injection 0.5 mL  0.5 mL Intramuscular Tomorrow-1000 Jesse Sans, MD      . magnesium hydroxide (MILK OF MAGNESIA) suspension 30 mL  30 mL Oral Daily PRN Clapacs, John T, MD      . nicotine (NICODERM CQ - dosed in mg/24 hours) patch 21 mg  21 mg Transdermal Q0600 Clapacs, Jackquline Denmark, MD   21 mg at 12/19/19 0817  . QUEtiapine (SEROQUEL) tablet 200 mg  200 mg Oral QHS Jesse Sans, MD   200 mg at 12/18/19 2106  . rOPINIRole (REQUIP) tablet 0.25 mg  0.25 mg Oral QHS Les Pou M, MD   0.25 mg at 12/18/19 2107   PTA Medications: Medications Prior to Admission  Medication Sig Dispense Refill Last Dose  . hydrOXYzine (ATARAX/VISTARIL) 50 MG tablet Take 1 tablet (50 mg total) by mouth 3 (three) times daily as needed (PRN). (Patient not taking: Reported on 12/07/2019) 30 tablet 1   . QUEtiapine (SEROQUEL) 100 MG tablet Take 1 tablet (100 mg total) by mouth at bedtime. 30 tablet 1     Patient Stressors: Marital or family  conflict Substance abuse  Patient Strengths: Average or above average intelligence Capable of independent living Communication skills General fund of knowledge  Treatment Modalities: Medication Management, Group therapy, Case management,  1 to 1 session with clinician, Psychoeducation, Recreational therapy.   Physician Treatment Plan for Primary Diagnosis: Bipolar 1 disorder, depressed (HCC) Long Term Goal(s): Improvement in symptoms so as ready for discharge Improvement in symptoms so as ready for discharge   Short Term Goals: Ability to identify changes in lifestyle to reduce recurrence of condition will improve Ability to verbalize feelings will improve Ability to disclose and discuss suicidal ideas Ability to demonstrate self-control will improve Ability to identify and develop effective coping behaviors will improve Compliance with prescribed medications will improve Ability to identify changes in lifestyle to reduce recurrence of condition will improve Ability to verbalize feelings will improve Ability to disclose and discuss suicidal ideas Ability to demonstrate self-control will improve Ability to identify and develop effective coping behaviors will improve Compliance with prescribed medications will improve Ability to identify triggers associated with substance abuse/mental health issues will improve  Medication Management: Evaluate patient's response, side effects, and tolerance of medication regimen.  Therapeutic Interventions: 1 to 1 sessions, Unit Group sessions and Medication administration.  Evaluation of Outcomes: Progressing  Physician Treatment Plan for Secondary Diagnosis: Principal Problem:   Bipolar 1 disorder, depressed (HCC) Active Problems:   Anxiety   Opiate abuse, continuous (HCC)  Methamphetamine abuse (HCC)   Amphetamine and psychostimulant-induced psychosis with hallucinations (HCC)  Long Term Goal(s): Improvement in symptoms so as ready for  discharge Improvement in symptoms so as ready for discharge   Short Term Goals: Ability to identify changes in lifestyle to reduce recurrence of condition will improve Ability to verbalize feelings will improve Ability to disclose and discuss suicidal ideas Ability to demonstrate self-control will improve Ability to identify and develop effective coping behaviors will improve Compliance with prescribed medications will improve Ability to identify changes in lifestyle to reduce recurrence of condition will improve Ability to verbalize feelings will improve Ability to disclose and discuss suicidal ideas Ability to demonstrate self-control will improve Ability to identify and develop effective coping behaviors will improve Compliance with prescribed medications will improve Ability to identify triggers associated with substance abuse/mental health issues will improve     Medication Management: Evaluate patient's response, side effects, and tolerance of medication regimen.  Therapeutic Interventions: 1 to 1 sessions, Unit Group sessions and Medication administration.  Evaluation of Outcomes: Progressing   RN Treatment Plan for Primary Diagnosis: Bipolar 1 disorder, depressed (HCC) Long Term Goal(s): Knowledge of disease and therapeutic regimen to maintain health will improve  Short Term Goals: Ability to verbalize frustration and anger appropriately will improve, Ability to participate in decision making will improve and Ability to verbalize feelings will improve  Medication Management: RN will administer medications as ordered by provider, will assess and evaluate patient's response and provide education to patient for prescribed medication. RN will report any adverse and/or side effects to prescribing provider.  Therapeutic Interventions: 1 on 1 counseling sessions, Psychoeducation, Medication administration, Evaluate responses to treatment, Monitor vital signs and CBGs as ordered,  Perform/monitor CIWA, COWS, AIMS and Fall Risk screenings as ordered, Perform wound care treatments as ordered.  Evaluation of Outcomes: Progressing   LCSW Treatment Plan for Primary Diagnosis: Bipolar 1 disorder, depressed (HCC) Long Term Goal(s): Safe transition to appropriate next level of care at discharge, Engage patient in therapeutic group addressing interpersonal concerns.  Short Term Goals: Engage patient in aftercare planning with referrals and resources, Increase social support, Increase ability to appropriately verbalize feelings, Increase emotional regulation, Facilitate patient progression through stages of change regarding substance use diagnoses and concerns and Identify triggers associated with mental health/substance abuse issues  Therapeutic Interventions: Assess for all discharge needs, 1 to 1 time with Social worker, Explore available resources and support systems, Assess for adequacy in community support network, Educate family and significant other(s) on suicide prevention, Complete Psychosocial Assessment, Interpersonal group therapy.  Evaluation of Outcomes: Progressing   Progress in Treatment: Attending groups: Yes. Participating in groups: Yes. Taking medication as prescribed: Yes. Toleration medication: Yes. Family/Significant other contact made: Yes, individual(s) contacted:  Sherrill Raring, mother.  Patient understands diagnosis: Yes. Discussing patient identified problems/goals with staff: Yes. Medical problems stabilized or resolved: Yes. Denies suicidal/homicidal ideation: Yes. Issues/concerns per patient self-inventory: No. Other: None  New problem(s) identified: No, Describe:  None  New Short Term/Long Term Goal(s): Detox, emotional regulation, discharge planning, attend group sessions. Update 12/14/19: No changes at this time.Updated 12/19/19 No change at this time.  Patient Goals:  "get out of here" Update 12/14/19: No changes at this time. Update  12/19/19 No change at this time.  Discharge Plan or Barriers: Patient unsure of post discharge plans, CSW will follow-up. Provided material to inpatient substance use facilities. Update 12/14/19: Referrals have been submitted and awaiting response from [facility]. Accepted at [facility] but they have no  beds currently. [facility] has no bed availability until the end of the month. Update 12/19/19 CSW to followup with [facility]. No change related to [facility] or [facility].   Reason for Continuation of Hospitalization: Anxiety Depression Medication stabilization Withdrawal symptoms   Estimated Length of Stay: 1-7 Days   Recreational Therapy: Patient Stressors: N/A Patient Goal: Patient will engage in groups without prompting or encouragement from LRT x3 group sessions within 5 recreation therapy group sessions.   Attendees: Patient: 12/19/2019 9:06 AM  Physician: Les Pou, MD 12/19/2019 9:06 AM  Nursing: 12/19/2019 9:06 AM  RN Care Manager: 12/19/2019 9:06 AM  Social Worker: Gwenevere Ghazi, MSW, LCSWA 12/19/2019 9:06 AM  Recreational Therapist:  12/19/2019 9:06 AM  Other: Penni Homans, MSW, LCSW 12/19/2019 9:06 AM  Other: Vilma Meckel. Algis Greenhouse, MSW, [place], LCAS 12/19/2019 9:06 AM  Other: 12/19/2019 9:06 AM    Scribe for Treatment Team: Corky Crafts, LCSWA 12/19/2019 9:06 AM

## 2019-12-19 NOTE — B[facility] Counselor (Signed)
CSW submitted referral to [facility] residential substance use treatment program at the request of the Patient on 19 Dec 2019. CSW will follow-up on 21 Dec 2019.   Casimiro Needle, MSW, [place], LCASA 12/19/2019 4:19 PM

## 2019-12-19 NOTE — Plan of Care (Signed)
Patient is at her baseline, awaiting placement   Problem: Education: Goal: Emotional status will improve Outcome: Not Progressing Goal: Mental status will improve Outcome: Not Progressing

## 2019-12-19 NOTE — Progress Notes (Signed)
[facility]  12/19/2019 3:51 PM Anabel HalonBrandy D Mahany  MRN:  [id number]  Principal Problem: Bipolar 1 disorder, depressed (HCC) Diagnosis: Principal Problem:   Bipolar 1 disorder, depressed (HCC) Active Problems:   Anxiety   Opiate abuse, continuous (HCC)   Methamphetamine abuse (HCC)   Amphetamine and psychostimulant-induced psychosis with hallucinations [facility](HCC)  [name] is a  24y.o. female [facility], agitation, suicidal ideations.  Interval History Patient was seen today for re-evaluation.  Nursing reports no events overnight. The patient has no issues with performing ADLs.  Patient has been medication compliant.    Subjective:  On assessment patient reports "I'm feeling good". She reports slept well overnight, and denies having nightmares. She denies suicidal or homicidal thoughts. She denies any hallucinations today. The patient reports no side effects from medications. She is interested in referral to inpatient substance-abuse treatment program. Patient approved by [facility], bed possibly available next week. [facility], and her lawyer has sent letter on her behalf. [facility]. She has no questions or concerns today.     Total Time spent with patient: 30 minutes  Past Psychiatric History: seed H&P  Past Medical History:  Past Medical History:  Diagnosis Date  . Drug abuse (HCC)   . Headache   . SAB (spontaneous abortion) 01/19/2014   stillborn at 20.4 wks    Past Surgical History:  Procedure Laterality Date  . INCISION AND DRAINAGE ABSCESS Left 05/07/2019   Procedure: INCISION AND DRAINAGE ABSCESS;  Surgeon: Leafy RoPabon, Diego F, MD;  Location: [facility] ORS;  Service: General;  Laterality: Left;   Family History:  Family History  Problem Relation Age of Onset  . Hypertension Father   . Hypertension Paternal Grandfather    Family Psychiatric  History: see H&P  Social History:  Social History    Substance and Sexual Activity  Alcohol Use No  . Alcohol/week: 0.0 standard drinks     Social History   Substance and Sexual Activity  Drug Use Yes  . Types: Other-see comments, Amphetamines, Fentanyl, Heroin   Comment: subutex tx, snorted heroin last on 5/8    Social History   Socioeconomic History  . Marital status: Legally Separated    Spouse name: Not on file  . Number of children: 1  . Years of education: Not on file  . Highest education level: Not on file  Occupational History    Employer: [employer]  Tobacco Use  . Smoking status: Never Smoker  . Smokeless tobacco: Never Used  Vaping Use  . Vaping Use: Never used  Substance and Sexual Activity  . Alcohol use: No    Alcohol/week: 0.0 standard drinks  . Drug use: Yes    Types: Other-see comments, Amphetamines, Fentanyl, Heroin    Comment: subutex tx, snorted heroin last on 5/8  . Sexual activity: Yes    Partners: Male    Birth control/protection: None  Other Topics Concern  . Not on file  Social History Narrative  . Not on file   Social Determinants of Health   Financial Resource Strain:   . Difficulty of Paying Living Expenses: Not on file  Food Insecurity:   . Worried About Programme researcher, broadcasting/film/videounning Out of Food in the Last Year: Not on file  . Ran Out of Food in the Last Year: Not on file  Transportation Needs:   . Lack of Transportation (Medical): Not on file  . Lack of Transportation (Non-Medical): Not  on file  Physical Activity:   . Days of Exercise per Week: Not on file  . Minutes of Exercise per Session: Not on file  Stress:   . Feeling of Stress : Not on file  Social Connections:   . Frequency of Communication with Friends and Family: Not on file  . Frequency of Social Gatherings with Friends and Family: Not on file  . Attends Religious Services: Not on file  . Active Member of Clubs or Organizations: Not on file  . Attends Banker Meetings: Not on file  . Marital Status: Not on file    Additional Social History:                         Sleep: Good  Appetite:  Good  Current Medications: Current Facility-Administered Medications  Medication Dose Route Frequency Provider Last Rate Last Admin  . acetaminophen (TYLENOL) tablet 650 mg  650 mg Oral Q6H PRN Clapacs, Jackquline Denmark, MD   650 mg at 12/17/19 1705  . alum & mag hydroxide-simeth (MAALOX/MYLANTA) 200-200-20 MG/5ML suspension 30 mL  30 mL Oral Q4H PRN Clapacs, John T, MD   30 mL at 12/15/19 1949  . hydrOXYzine (ATARAX/VISTARIL) tablet 50 mg  50 mg Oral TID PRN Clapacs, Jackquline Denmark, MD   50 mg at 12/19/19 0816  . influenza vac split quadrivalent PF (FLUARIX) injection 0.5 mL  0.5 mL Intramuscular Tomorrow-1000 Jesse Sans, MD      . magnesium hydroxide (MILK OF MAGNESIA) suspension 30 mL  30 mL Oral Daily PRN Clapacs, John T, MD      . nicotine (NICODERM CQ - dosed in mg/24 hours) patch 21 mg  21 mg Transdermal Q0600 Clapacs, Jackquline Denmark, MD   21 mg at 12/19/19 0817  . QUEtiapine (SEROQUEL) tablet 200 mg  200 mg Oral QHS Jesse Sans, MD   200 mg at 12/18/19 2106  . rOPINIRole (REQUIP) tablet 0.25 mg  0.25 mg Oral QHS Jesse Sans, MD   0.25 mg at 12/18/19 2107    Lab Results:  No results found for this or any previous visit (from the past 48 hour(s)).  Blood Alcohol level:  Lab Results  Component Value Date   ETH <10 12/06/2019   ETH <10 12/01/2019    Metabolic Disorder Labs: No results found for: HGBA1C, MPG No results found for: PROLACTIN No results found for: CHOL, TRIG, HDL, CHOLHDL, VLDL, LDLCALC  Physical Findings: AIMS: Facial and Oral Movements Muscles of Facial Expression: None, normal Lips and Perioral Area: None, normal Jaw: None, normal Tongue: None, normal,Extremity Movements Upper (arms, wrists, hands, fingers): None, normal Lower (legs, knees, ankles, toes): None, normal, Trunk Movements Neck, shoulders, hips: None, normal, Overall Severity Severity of abnormal movements  (highest score from questions above): None, normal Incapacitation due to abnormal movements: None, normal Patient's awareness of abnormal movements (rate only patient's report): No Awareness, Dental Status Current problems with teeth and/or dentures?: No Does patient usually wear dentures?: No  CIWA:    COWS:     Musculoskeletal: Strength & Muscle Tone: within normal limits Gait & Station: normal Patient leans: N/A  Psychiatric Specialty Exam: Physical Exam Vitals and nursing note reviewed.  HENT:     Head: Normocephalic and atraumatic.     Right Ear: External ear normal.     Left Ear: External ear normal.     Nose: Nose normal.     Mouth/Throat:     Mouth: Mucous  membranes are moist.     Pharynx: Oropharynx is clear.  Eyes:     Extraocular Movements: Extraocular movements intact.     Conjunctiva/sclera: Conjunctivae normal.     Pupils: Pupils are equal, round, and reactive to light.  Cardiovascular:     Rate and Rhythm: Normal rate.     Pulses: Normal pulses.  Pulmonary:     Effort: Pulmonary effort is normal.     Breath sounds: Normal breath sounds.  Abdominal:     General: Abdomen is flat.     Palpations: Abdomen is soft.  Musculoskeletal:        General: No swelling. Normal range of motion.     Cervical back: Normal range of motion and neck supple.  Skin:    General: Skin is warm and dry.  Neurological:     General: No focal deficit present.     Mental Status: She is oriented to person, place, and time.  Psychiatric:        Attention and Perception: Attention and perception normal.        Mood and Affect: Mood is anxious.        Speech: Speech normal.        Behavior: Behavior is cooperative.        Thought Content: Thought content normal.        Cognition and Memory: Cognition and memory normal.        Judgment: Judgment is impulsive.     Review of Systems  Constitutional: Negative for appetite change and fatigue.  HENT: Negative for rhinorrhea and sore  throat.   Eyes: Negative for photophobia and visual disturbance.  Respiratory: Negative for cough and shortness of breath.   Cardiovascular: Negative for chest pain and palpitations.  Gastrointestinal: Negative for constipation, diarrhea, nausea and vomiting.  Endocrine: Negative for cold intolerance and heat intolerance.  Genitourinary: Negative for difficulty urinating and dysuria.  Musculoskeletal: Negative for arthralgias and myalgias.  Skin: Negative for rash and wound.  Allergic/Immunologic: Negative for environmental allergies and food allergies.  Neurological: Negative for dizziness and headaches.  Hematological: Negative for adenopathy. Does not bruise/bleed easily.  Psychiatric/Behavioral: Negative for hallucinations and suicidal ideas. The patient is nervous/anxious.     Blood pressure 106/68, pulse 92, temperature 97.7 F (36.5 C), temperature source Oral, resp. rate 18, height 5\' 1"  (1.549 m), weight 105 kg, last menstrual period 12/01/2019, SpO2 100 %, unknown if currently breastfeeding.Body mass index is 43.74 kg/m.  General Appearance: Casual  Eye Contact:  Good  Speech:  Clear and Coherent and Normal Rate  Volume:  Normal  Mood:  Euthymic  Affect:  Appropriate and Congruent  Thought Process:  Coherent and Goal Directed  Orientation:  Full (Time, Place, and Person)  Thought Content:  Logical  Suicidal Thoughts:  No  Homicidal Thoughts:  No  Memory:  Immediate;   Fair Recent;   Fair Remote;   NA  Judgement:  Fair  Insight:  Fair  Psychomotor Activity:  Normal  Concentration:  Concentration: Fair and Attention Span: Fair  Recall:  13/04/2019 of Knowledge:  Fair  Language:  Fair  Akathisia:  No  Handed:  Right  AIMS (if indicated):     Assets:  Communication Skills Desire for Improvement Resilience Social Support  ADL's:  Intact  Cognition:  WNL  Sleep:  Number of Hours: 6.5     Treatment Plan Summary: Daily contact with patient to assess and evaluate  symptoms and progress in treatment and Medication  management   Patient is a 24 year old female with the above-stated past psychiatric history who is seen in follow-up.  Chart reviewed. Patient discussed with nursing. Patient reports mood improvement today likely in settings of both - after psychotropic medications were restarted and due to not having symptoms of acute opioid withdrawal currently. Denies unsafe thoughts, denies symptoms of psychosis, reports improved sleep. No side effects from medications reported. The current med combination will be continued. Will check iron levels and start ropinerol 0.25 mg QHS for restless leg syndrome. Patient is interested in referral to inpatient substance-abuse treatment program. Approved by [facility] who may have bed availability early next week. Lawyer faxed letter to [facility], and pending review.    Plan: -continue inpatient psych admission; 15-minute checks; daily contact with patient to assess and evaluate symptoms and progress in treatment; psychoeducation.  -continue scheduled medications: . influenza vac split quadrivalent PF  0.5 mL Intramuscular Tomorrow-1000  . nicotine  21 mg Transdermal Q0600  . QUEtiapine  200 mg Oral QHS  . rOPINIRole  0.25 mg Oral QHS    -continue PRN medications.  acetaminophen, alum & mag hydroxide-simeth, hydrOXYzine, magnesium hydroxide  -Pertinent Labs:  HCV Quantitative >50 IU/mL 749,000   HCV Quantitative Log >1.70 log10 IU/mL 5.874   Infectious disease contacted about results, and they advise outpatient follow-up. Will contact [phone] to schedule appointment at discharge.    -EKG: on 11/4 showed Qtc 472 with normal sinus rhythm.   -Disposition: Estimated duration of hospitalization: Late this week vs early next week pending inpatient substance abuse treatment. Currently approved for [facility], but no beds available today. Awaiting call back from [facility]. Application for [facility] sent today. All necessary aftercare  will be arranged prior to discharge   -  I certify that the patient does need, on a daily basis, active treatment furnished directly by or requiring the supervision of inpatient psychiatric facility personnel.   Jesse Sans, MD 12/19/2019, 3:51 PM

## 2019-12-19 NOTE — Progress Notes (Signed)
Patient calm and cooperative during assessment, denying SI/HI/AVH. Patient observed interacting appropriately with staff and peers on the unit. Patient compliant with medication administration per MD orders. Patient given education, support, and encouragement to be active in her treatment plan. Patient being monitored Q 15 minutes for safety per unit protocol. Pt remains safe on the unit. 

## 2019-12-19 NOTE — Plan of Care (Signed)
Patient is pleasant and cooperative on approach.Denies SI,HI and AVH.Appropriate with staff & peers.Patient had PRN for anxiety x 1 today.Patient stated that one of the peers is triggering her anxiety. Compliant with medications.Attended groups.Appetite and energy level good.Support and encouragement given.

## 2019-12-19 NOTE — BHH Counselor (Signed)
CSW contacted [facility] ([facility] Representative reported the application had been approved, awaiting bed date. Representative was unable to provide a estimated bed date. CSW also attempted to contact [facility] ([facility]) for status update on referral. Admissions representative was not reached.   Casimiro Needle, MSW, [place], LCASA 12/19/2019 1:14 PM

## 2019-12-19 NOTE — Progress Notes (Signed)
Recreation Therapy Notes   Date: 12/19/2019  Time: 9:30 am   Location: Craft room   Behavioral response: Appropriate  Intervention Topic: Self-esteem   Discussion/Intervention:  Group content today was focused on self-esteem. Patient defined self-esteem and where it comes form. The group described reasons self-esteem is important. Individuals stated things that impact self-esteem and positive ways to improve self-esteem. Patient went over the components of self-esteem. The group participated in the intervention where patients were able to use origami to identify positive ways to improve their self-esteem.  Clinical Observations/Feedback: Patient came to group and defined self-esteem as how you feel about yourself. She stated that self-esteem is mainly impacted by friends and family. Participant explained that she can improve her self-esteem by getting clean. Individual was social with peers and staff while participating in the intervention.  Adalida Garver LRT/CTRS         Perry Brucato 12/19/2019 12:41 PM

## 2019-12-20 ENCOUNTER — Other Ambulatory Visit: Payer: Self-pay | Admitting: Behavioral Health

## 2019-12-20 DIAGNOSIS — F319 Bipolar disorder, unspecified: Secondary | ICD-10-CM | POA: Diagnosis not present

## 2019-12-20 MED ORDER — ROPINIROLE HCL 0.25 MG PO TABS
0.2500 mg | ORAL_TABLET | Freq: Every day | ORAL | 0 refills | Status: DC
Start: 2019-12-20 — End: 2019-12-23

## 2019-12-20 MED ORDER — TUBERCULIN PPD 5 UNIT/0.1ML ID SOLN
5.0000 [IU] | Freq: Once | INTRADERMAL | Status: AC
  Administered 2019-12-20: 5 [IU] via INTRADERMAL
  Filled 2019-12-20: qty 0.1

## 2019-12-20 MED ORDER — QUETIAPINE FUMARATE 200 MG PO TABS
200.0000 mg | ORAL_TABLET | Freq: Every day | ORAL | 0 refills | Status: DC
Start: 2019-12-20 — End: 2019-12-23

## 2019-12-20 NOTE — BHH Counselor (Signed)
CSW contacted [facility] to check on statu sof patient's chart.  Per Drema Pry at [facility] she has not received a letter from the patient's lawyer and until that has been received she can not move forward with the patient's referral.    CSW notes that patient reports that she has contacted her lawyer and the letter has been sent.  Penni Homans, MSW, LCSW 12/20/2019 1:00 PM

## 2019-12-20 NOTE — Progress Notes (Signed)
Patient received TB skin test in the right forearm and tolerated well. Was educated prior to administration and verbalized understanding.

## 2019-12-20 NOTE — Progress Notes (Signed)
Recreation Therapy Notes    Date: 12/20/2019  Time: 9:30 am   Location: Craft room     Behavioral response: N/A   Intervention Topic: Teamwork    Discussion/Intervention: Patient did not attend group.   Clinical Observations/Feedback:  Patient did not attend group.   Hancel Ion LRT/CTRS        Dajanay Northrup 12/20/2019 12:07 PM

## 2019-12-20 NOTE — Plan of Care (Signed)
Patient presents at her baseline, ready top D/C this week  Problem: Education: Goal: Emotional status will improve Outcome: Not Progressing Goal: Mental status will improve Outcome: Not Progressing

## 2019-12-20 NOTE — Plan of Care (Signed)
Cooperative on approach and denying thoughts of self harm. Alert and oriented.Eating and sleeping well. No sign of distress.

## 2019-12-20 NOTE — Progress Notes (Signed)
[facility]  12/20/2019 11:52 AM Anabel HalonBrandy D Ksiazek  MRN:  [id number]  Principal Problem: Bipolar 1 disorder, depressed (HCC) Diagnosis: Principal Problem:   Bipolar 1 disorder, depressed (HCC) Active Problems:   Anxiety   Opiate abuse, continuous (HCC)   Methamphetamine abuse (HCC)   Amphetamine and psychostimulant-induced psychosis with hallucinations [facility](HCC)  [name] is a  24y.o. female who [facility], agitation, suicidal ideations.  Interval History Patient was seen today for re-evaluation.  Nursing reports no events overnight. The patient has no issues with performing ADLs.  Patient has been medication compliant.    Subjective:  On assessment patient reports "I'm feeling good". She reports slept well overnight, and denies having nightmares. She denies suicidal or homicidal thoughts. She denies any hallucinations today. The patient reports no side effects from medications. She is interested in referral to inpatient substance-abuse treatment program. Patient approved by [facility], bed possibly available next week. [facility] and her lawyer has sent letter on her behalf. [facility]. She has no questions or concerns today.     Total Time spent with patient: 30 minutes  Past Psychiatric History: seed H&P  Past Medical History:  Past Medical History:  Diagnosis Date  . Drug abuse (HCC)   . Headache   . SAB (spontaneous abortion) 01/19/2014   stillborn at 20.4 wks    Past Surgical History:  Procedure Laterality Date  . INCISION AND DRAINAGE ABSCESS Left 05/07/2019   Procedure: INCISION AND DRAINAGE ABSCESS;  Surgeon: Leafy RoPabon, Diego F, MD;  Location: [facility] ORS;  Service: General;  Laterality: Left;   Family History:  Family History  Problem Relation Age of Onset  . Hypertension Father   . Hypertension Paternal Grandfather    Family Psychiatric  History: see H&P  Social History:  Social History    Substance and Sexual Activity  Alcohol Use No  . Alcohol/week: 0.0 standard drinks     Social History   Substance and Sexual Activity  Drug Use Yes  . Types: Other-see comments, Amphetamines, Fentanyl, Heroin   Comment: subutex tx, snorted heroin last on 5/8    Social History   Socioeconomic History  . Marital status: Legally Separated    Spouse name: Not on file  . Number of children: 1  . Years of education: Not on file  . Highest education level: Not on file  Occupational History    Employer: [employer]  Tobacco Use  . Smoking status: Never Smoker  . Smokeless tobacco: Never Used  Vaping Use  . Vaping Use: Never used  Substance and Sexual Activity  . Alcohol use: No    Alcohol/week: 0.0 standard drinks  . Drug use: Yes    Types: Other-see comments, Amphetamines, Fentanyl, Heroin    Comment: subutex tx, snorted heroin last on 5/8  . Sexual activity: Yes    Partners: Male    Birth control/protection: None  Other Topics Concern  . Not on file  Social History Narrative  . Not on file   Social Determinants of Health   Financial Resource Strain:   . Difficulty of Paying Living Expenses: Not on file  Food Insecurity:   . Worried About Programme researcher, broadcasting/film/videounning Out of Food in the Last Year: Not on file  . Ran Out of Food in the Last Year: Not on file  Transportation Needs:   . Lack of Transportation (Medical): Not on file  . Lack of Transportation (Non-Medical): Not  on file  Physical Activity:   . Days of Exercise per Week: Not on file  . Minutes of Exercise per Session: Not on file  Stress:   . Feeling of Stress : Not on file  Social Connections:   . Frequency of Communication with Friends and Family: Not on file  . Frequency of Social Gatherings with Friends and Family: Not on file  . Attends Religious Services: Not on file  . Active Member of Clubs or Organizations: Not on file  . Attends Banker Meetings: Not on file  . Marital Status: Not on file    Additional Social History:                         Sleep: Good  Appetite:  Good  Current Medications: Current Facility-Administered Medications  Medication Dose Route Frequency Provider Last Rate Last Admin  . acetaminophen (TYLENOL) tablet 650 mg  650 mg Oral Q6H PRN Clapacs, Jackquline Denmark, MD   650 mg at 12/19/19 2107  . alum & mag hydroxide-simeth (MAALOX/MYLANTA) 200-200-20 MG/5ML suspension 30 mL  30 mL Oral Q4H PRN Clapacs, John T, MD   30 mL at 12/19/19 1710  . influenza vac split quadrivalent PF (FLUARIX) injection 0.5 mL  0.5 mL Intramuscular Tomorrow-1000 Jesse Sans, MD      . magnesium hydroxide (MILK OF MAGNESIA) suspension 30 mL  30 mL Oral Daily PRN Clapacs, John T, MD      . nicotine (NICODERM CQ - dosed in mg/24 hours) patch 21 mg  21 mg Transdermal Q0600 Clapacs, Jackquline Denmark, MD   21 mg at 12/20/19 1036  . QUEtiapine (SEROQUEL) tablet 200 mg  200 mg Oral QHS Jesse Sans, MD   200 mg at 12/19/19 2107  . rOPINIRole (REQUIP) tablet 0.25 mg  0.25 mg Oral QHS Jesse Sans, MD   0.25 mg at 12/19/19 2107  . tuberculin injection 5 Units  5 Units Intradermal Once Jesse Sans, MD        Lab Results:  No results found for this or any previous visit (from the past 48 hour(s)).  Blood Alcohol level:  Lab Results  Component Value Date   ETH <10 12/06/2019   ETH <10 12/01/2019    Metabolic Disorder Labs: No results found for: HGBA1C, MPG No results found for: PROLACTIN No results found for: CHOL, TRIG, HDL, CHOLHDL, VLDL, LDLCALC  Physical Findings: AIMS: Facial and Oral Movements Muscles of Facial Expression: None, normal Lips and Perioral Area: None, normal Jaw: None, normal Tongue: None, normal,Extremity Movements Upper (arms, wrists, hands, fingers): None, normal Lower (legs, knees, ankles, toes): None, normal, Trunk Movements Neck, shoulders, hips: None, normal, Overall Severity Severity of abnormal movements (highest score from questions  above): None, normal Incapacitation due to abnormal movements: None, normal Patient's awareness of abnormal movements (rate only patient's report): No Awareness, Dental Status Current problems with teeth and/or dentures?: No Does patient usually wear dentures?: No  CIWA:    COWS:     Musculoskeletal: Strength & Muscle Tone: within normal limits Gait & Station: normal Patient leans: N/A  Psychiatric Specialty Exam: Physical Exam Vitals and nursing note reviewed.  HENT:     Head: Normocephalic and atraumatic.     Right Ear: External ear normal.     Left Ear: External ear normal.     Nose: Nose normal.     Mouth/Throat:     Mouth: Mucous membranes are moist.  Pharynx: Oropharynx is clear.  Eyes:     Extraocular Movements: Extraocular movements intact.     Conjunctiva/sclera: Conjunctivae normal.     Pupils: Pupils are equal, round, and reactive to light.  Cardiovascular:     Rate and Rhythm: Normal rate.     Pulses: Normal pulses.  Pulmonary:     Effort: Pulmonary effort is normal.     Breath sounds: Normal breath sounds.  Abdominal:     General: Abdomen is flat.     Palpations: Abdomen is soft.  Musculoskeletal:        General: No swelling. Normal range of motion.     Cervical back: Normal range of motion and neck supple.  Skin:    General: Skin is warm and dry.  Neurological:     General: No focal deficit present.     Mental Status: She is oriented to person, place, and time.  Psychiatric:        Attention and Perception: Attention and perception normal.        Mood and Affect: Mood and affect normal.        Speech: Speech normal.        Behavior: Behavior is cooperative.        Thought Content: Thought content normal.        Cognition and Memory: Cognition and memory normal.        Judgment: Judgment is impulsive.     Review of Systems  Constitutional: Negative for appetite change and fatigue.  HENT: Negative for rhinorrhea and sore throat.   Eyes:  Negative for photophobia and visual disturbance.  Respiratory: Negative for cough and shortness of breath.   Cardiovascular: Negative for chest pain and palpitations.  Gastrointestinal: Negative for constipation, diarrhea, nausea and vomiting.  Endocrine: Negative for cold intolerance and heat intolerance.  Genitourinary: Negative for difficulty urinating and dysuria.  Musculoskeletal: Negative for arthralgias and myalgias.  Skin: Negative for rash and wound.  Allergic/Immunologic: Negative for environmental allergies and food allergies.  Neurological: Negative for dizziness and headaches.  Hematological: Negative for adenopathy. Does not bruise/bleed easily.  Psychiatric/Behavioral: Negative for hallucinations and suicidal ideas. The patient is not nervous/anxious.     Blood pressure 102/63, pulse 80, temperature 97.9 F (36.6 C), temperature source Oral, resp. rate 18, height 5\' 1"  (1.549 m), weight 105 kg, last menstrual period 12/01/2019, SpO2 100 %, unknown if currently breastfeeding.Body mass index is 43.74 kg/m.  General Appearance: Casual  Eye Contact:  Good  Speech:  Clear and Coherent and Normal Rate  Volume:  Normal  Mood:  Euthymic  Affect:  Appropriate and Congruent  Thought Process:  Coherent and Goal Directed  Orientation:  Full (Time, Place, and Person)  Thought Content:  Logical  Suicidal Thoughts:  No  Homicidal Thoughts:  No  Memory:  Immediate;   Fair Recent;   Fair Remote;   NA  Judgement:  Fair  Insight:  Fair  Psychomotor Activity:  Normal  Concentration:  Concentration: Fair and Attention Span: Fair  Recall:  13/04/2019 of Knowledge:  Fair  Language:  Fair  Akathisia:  No  Handed:  Right  AIMS (if indicated):     Assets:  Communication Skills Desire for Improvement Resilience Social Support  ADL's:  Intact  Cognition:  WNL  Sleep:  Number of Hours: 7.5     Treatment Plan Summary: Daily contact with patient to assess and evaluate symptoms and  progress in treatment and Medication management   Patient is  a 24 year old female with the above-stated past psychiatric history who is seen in follow-up.  Chart reviewed. Patient discussed with nursing. Patient reports mood improvement today likely in settings of both - after psychotropic medications were restarted and due to not having symptoms of acute opioid withdrawal currently. Denies unsafe thoughts, denies symptoms of psychosis, reports improved sleep. No side effects from medications reported. The current med combination will be continued. Iron levels within normal limits. Ropinerol 0.25 mg QHS for restless leg syndrome. Vistaril discontinued today.   Plan: -continue inpatient psych admission; 15-minute checks; daily contact with patient to assess and evaluate symptoms and progress in treatment; psychoeducation.  -continue scheduled medications: . influenza vac split quadrivalent PF  0.5 mL Intramuscular Tomorrow-1000  . nicotine  21 mg Transdermal Q0600  . QUEtiapine  200 mg Oral QHS  . rOPINIRole  0.25 mg Oral QHS  . tuberculin  5 Units Intradermal Once    -continue PRN medications.  acetaminophen, alum & mag hydroxide-simeth, magnesium hydroxide  -Pertinent Labs:  HCV Quantitative >50 IU/mL 749,000   HCV Quantitative Log >1.70 log10 IU/mL 5.874   Infectious disease contacted about results, and they advise outpatient follow-up. Will contact [phone] to schedule appointment at discharge.    -EKG: on 11/4 showed Qtc 472 with normal sinus rhythm.   -Disposition: Estimated duration of hospitalization: Late this week vs early next week pending inpatient substance abuse treatment. Tenatively accepted to [facility] for Friday of this week. Requested 30-day supply of medication from pharmacy, and discontinued Vistaril. PPD test and covid test ordered today.   -  I certify that the patient does need, on a daily basis, active treatment furnished directly by or requiring the  supervision of inpatient psychiatric facility personnel.   Jesse Sans, MD 12/20/2019, 11:52 AM

## 2019-12-20 NOTE — B[facility] Counselor (Signed)
CSW completed [facility] request for additional funding.  Penni Homans, MSW, LCSW 12/20/2019 4:01 PM

## 2019-12-20 NOTE — BHH Counselor (Signed)
CSW received call from [place] at [facility].  She reports that patient is tentatively accepted if psychiatrist is willing to adjust medications.  CSW discussed with psychiatrist who reports that she can.    CSW was informed that patient will need a Covid test and TB test.  She will not be able to go until 12:30 on Friday, hospital will need to provide transportation.  She will also need a 30 day supply of her medication and a new physician's statement form completed.    Penni Homans, MSW, LCSW 12/20/2019 11:45 AM

## 2019-12-20 NOTE — B[facility] Group Notes (Signed)
LCSW Group Therapy Note  12/20/2019 1:00 PM  Type of Therapy/Topic:  Group Therapy:  Feelings about Diagnosis  Participation Level:  Active   Description of Group:   This group will allow patients to explore their thoughts and feelings about diagnoses they have received. Patients will be guided to explore their level of understanding and acceptance of these diagnoses. Facilitator will encourage patients to process their thoughts and feelings about the reactions of others to their diagnosis and will guide patients in identifying ways to discuss their diagnosis with significant others in their lives. This group will be process-oriented, with patients participating in exploration of their own experiences, giving and receiving support, and processing challenge from other group members.   Therapeutic Goals: 1. Patient will demonstrate understanding of diagnosis as evidenced by identifying two or more symptoms of the disorder 2. Patient will be able to express two feelings regarding the diagnosis 3. Patient will demonstrate their ability to communicate their needs through discussion and/or role play  Summary of Patient Progress: Patient was present in group. Patient shared that diagnoses are "necessary".  She reports that she feels that people do treat medical and mental health differently.  She was receptive others and engaged in groups.   Therapeutic Modalities:   Cognitive Behavioral Therapy Brief Therapy Feelings Identification   Penni Homans, MSW, LCSW 12/20/2019 11:13 AM

## 2019-12-21 DIAGNOSIS — F319 Bipolar disorder, unspecified: Secondary | ICD-10-CM | POA: Diagnosis not present

## 2019-12-21 LAB — SARS CORONAVIRUS 2 BY RT PCR (HOSPITAL ORDER, PERFORMED IN ~~LOC~~ HOSPITAL LAB): SARS Coronavirus 2: NEGATIVE

## 2019-12-21 MED ORDER — BENZOYL PEROXIDE 5 % EX GEL
Freq: Two times a day (BID) | CUTANEOUS | Status: DC
  Filled 2019-12-21: qty 42.5

## 2019-12-21 NOTE — BHH Counselor (Signed)
CSW called Raechel Ache, Mother, [phone] at the request of patient who reports that mother wants the list of items that she can bring to [facility].  CSW left HIPAA compliant voicemail requesting a return phone call.  Penni Homans, MSW, LCSW 12/21/2019 12:15 PM

## 2019-12-21 NOTE — Progress Notes (Signed)
[facility] MD Progress Note  12/21/2019 1:27 PM Jill Shepherd  MRN:  [id number]  Principal Problem: Bipolar 1 disorder, depressed (HCC) Diagnosis: Principal Problem:   Bipolar 1 disorder, depressed (HCC) Active Problems:   Anxiety   Opiate abuse, continuous (HCC)   Methamphetamine abuse (HCC)   Amphetamine and psychostimulant-induced psychosis with hallucinations [facility])  Mr. Pelley is a  24y.o. female who was admitted to the [facility] unit for psychosis, agitation, suicidal ideations.  Interval History Patient was seen today for re-evaluation.  Nursing reports no events overnight. The patient has no issues with performing ADLs.  Patient has been medication compliant.    Subjective:  On assessment patient reports "I'm feeling good". She reports slept well overnight, and denies having nightmares. She denies suicidal or homicidal thoughts. She denies any hallucinations today. The patient reports no side effects from medications. She is very excited about being accepted to [facility] program, and looking forward to going there on Friday. She had no questions or concerns.    Total Time spent with patient: 30 minutes  Past Psychiatric History: seed H&P  Past Medical History:  Past Medical History:  Diagnosis Date  . Drug abuse (HCC)   . Headache   . SAB (spontaneous abortion) 01/19/2014   stillborn at 20.4 wks    Past Surgical History:  Procedure Laterality Date  . INCISION AND DRAINAGE ABSCESS Left 05/07/2019   Procedure: INCISION AND DRAINAGE ABSCESS;  Surgeon: Leafy Ro, MD;  Location: [facility] ORS;  Service: General;  Laterality: Left;   Family History:  Family History  Problem Relation Age of Onset  . Hypertension Father   . Hypertension Paternal Grandfather    Family Psychiatric  History: see H&P  Social History:  Social History   Substance and Sexual Activity  Alcohol Use No  . Alcohol/week: 0.0 standard drinks     Social History   Substance and Sexual Activity  Drug Use Yes   . Types: Other-see comments, Amphetamines, Fentanyl, Heroin   Comment: subutex tx, snorted heroin last on 5/8    Social History   Socioeconomic History  . Marital status: Legally Separated    Spouse name: Not on file  . Number of children: 1  . Years of education: Not on file  . Highest education level: Not on file  Occupational History    Employer: [employer]  Tobacco Use  . Smoking status: Never Smoker  . Smokeless tobacco: Never Used  Vaping Use  . Vaping Use: Never used  Substance and Sexual Activity  . Alcohol use: No    Alcohol/week: 0.0 standard drinks  . Drug use: Yes    Types: Other-see comments, Amphetamines, Fentanyl, Heroin    Comment: subutex tx, snorted heroin last on 5/8  . Sexual activity: Yes    Partners: Male    Birth control/protection: None  Other Topics Concern  . Not on file  Social History Narrative  . Not on file   Social Determinants of Health   Financial Resource Strain:   . Difficulty of Paying Living Expenses: Not on file  Food Insecurity:   . Worried About Programme researcher, broadcasting/film/video in the Last Year: Not on file  . Ran Out of Food in the Last Year: Not on file  Transportation Needs:   . Lack of Transportation (Medical): Not on file  . Lack of Transportation (Non-Medical): Not on file  Physical Activity:   . Days of Exercise per Week: Not on file  . Minutes of Exercise per  Session: Not on file  Stress:   . Feeling of Stress : Not on file  Social Connections:   . Frequency of Communication with Friends and Family: Not on file  . Frequency of Social Gatherings with Friends and Family: Not on file  . Attends Religious Services: Not on file  . Active Member of Clubs or Organizations: Not on file  . Attends Banker Meetings: Not on file  . Marital Status: Not on file   Additional Social History:                         Sleep: Good  Appetite:  Good  Current Medications: Current Facility-Administered Medications   Medication Dose Route Frequency Provider Last Rate Last Admin  . acetaminophen (TYLENOL) tablet 650 mg  650 mg Oral Q6H PRN Clapacs, Jackquline Denmark, MD   650 mg at 12/20/19 2107  . alum & mag hydroxide-simeth (MAALOX/MYLANTA) 200-200-20 MG/5ML suspension 30 mL  30 mL Oral Q4H PRN Clapacs, John T, MD   30 mL at 12/19/19 1710  . benzoyl peroxide 5 % gel   Topical BID Jesse Sans, MD      . influenza vac split quadrivalent PF (FLUARIX) injection 0.5 mL  0.5 mL Intramuscular Tomorrow-1000 Jesse Sans, MD      . magnesium hydroxide (MILK OF MAGNESIA) suspension 30 mL  30 mL Oral Daily PRN Clapacs, John T, MD      . nicotine (NICODERM CQ - dosed in mg/24 hours) patch 21 mg  21 mg Transdermal Q0600 Clapacs, Jackquline Denmark, MD   21 mg at 12/21/19 0828  . QUEtiapine (SEROQUEL) tablet 200 mg  200 mg Oral QHS Jesse Sans, MD   200 mg at 12/20/19 2107  . rOPINIRole (REQUIP) tablet 0.25 mg  0.25 mg Oral QHS Jesse Sans, MD   0.25 mg at 12/20/19 2107  . tuberculin injection 5 Units  5 Units Intradermal Once Jesse Sans, MD   5 Units at 12/20/19 1523    Lab Results:  Results for orders placed or performed during the hospital encounter of 12/07/19 (from the past 48 hour(s))  SARS Coronavirus 2 by RT PCR (hospital order, performed in [facility] hospital lab) Nasopharyngeal Nasopharyngeal Swab     Status: None   Collection Time: 12/20/19 11:28 AM   Specimen: Nasopharyngeal Swab  Result Value Ref Range   SARS Coronavirus 2 NEGATIVE NEGATIVE    Comment: (NOTE) SARS-CoV-2 target nucleic acids are NOT DETECTED.  The SARS-CoV-2 RNA is generally detectable in upper and lower respiratory specimens during the acute phase of infection. The lowest concentration of SARS-CoV-2 viral copies this assay can detect is 250 copies / mL. A negative result does not preclude SARS-CoV-2 infection and should not be used as the sole basis for treatment or other patient management decisions.  A negative result may occur  with improper specimen collection / handling, submission of specimen other than nasopharyngeal swab, presence of viral mutation(s) within the areas targeted by this assay, and inadequate number of viral copies (<250 copies / mL). A negative result must be combined with clinical observations, patient history, and epidemiological information.  Fact Sheet for Patients:   BoilerBrush.com.cy  Fact Sheet for Healthcare Providers: https://pope.com/  This test is not yet approved or  cleared by the [place] FDA and has been authorized for detection and/or diagnosis of SARS-CoV-2 by FDA under an Emergency Use Authorization (EUA).  This EUA will remain  in effect (meaning this test can be used) for the duration of the COVID-19 declaration under Section 564(b)(1) of the Act, 21 U.S.C. section 360bbb-3(b)(1), unless the authorization is terminated or revoked sooner.  Performed at [facility], [address] [place], [place] [address]     Blood Alcohol level:  Lab Results  Component Value Date   [place] <10 12/06/2019   ETH <10 12/01/2019    Metabolic Disorder Labs: No results found for: HGBA1C, MPG No results found for: PROLACTIN No results found for: CHOL, TRIG, HDL, CHOLHDL, VLDL, LDLCALC  Physical Findings: AIMS: Facial and Oral Movements Muscles of Facial Expression: None, normal Lips and Perioral Area: None, normal Jaw: None, normal Tongue: None, normal,Extremity Movements Upper (arms, wrists, hands, fingers): None, normal Lower (legs, knees, ankles, toes): None, normal, Trunk Movements Neck, shoulders, hips: None, normal, Overall Severity Severity of abnormal movements (highest score from questions above): None, normal Incapacitation due to abnormal movements: None, normal Patient's awareness of abnormal movements (rate only patient's report): No Awareness, Dental Status Current problems with teeth and/or  dentures?: No Does patient usually wear dentures?: No  CIWA:    COWS:     Musculoskeletal: Strength & Muscle Tone: within normal limits Gait & Station: normal Patient leans: N/A  Psychiatric Specialty Exam: Physical Exam Vitals and nursing note reviewed.  HENT:     Head: Normocephalic and atraumatic.     Right Ear: External ear normal.     Left Ear: External ear normal.     Nose: Nose normal.     Mouth/Throat:     Mouth: Mucous membranes are moist.     Pharynx: Oropharynx is clear.  Eyes:     Extraocular Movements: Extraocular movements intact.     Conjunctiva/sclera: Conjunctivae normal.     Pupils: Pupils are equal, round, and reactive to light.  Cardiovascular:     Rate and Rhythm: Normal rate.     Pulses: Normal pulses.  Pulmonary:     Effort: Pulmonary effort is normal.     Breath sounds: Normal breath sounds.  Abdominal:     General: Abdomen is flat.     Palpations: Abdomen is soft.  Musculoskeletal:        General: No swelling. Normal range of motion.     Cervical back: Normal range of motion and neck supple.  Skin:    General: Skin is warm and dry.  Neurological:     General: No focal deficit present.     Mental Status: She is oriented to person, place, and time.  Psychiatric:        Attention and Perception: Attention and perception normal.        Mood and Affect: Mood and affect normal.        Speech: Speech normal.        Behavior: Behavior is cooperative.        Thought Content: Thought content normal.        Cognition and Memory: Cognition and memory normal.        Judgment: Judgment is impulsive.     Review of Systems  Constitutional: Negative for appetite change and fatigue.  HENT: Negative for rhinorrhea and sore throat.   Eyes: Negative for photophobia and visual disturbance.  Respiratory: Negative for cough and shortness of breath.   Cardiovascular: Negative for chest pain and palpitations.  Gastrointestinal: Negative for constipation,  diarrhea, nausea and vomiting.  Endocrine: Negative for cold intolerance and heat intolerance.  Genitourinary: Negative for difficulty urinating  and dysuria.  Musculoskeletal: Negative for arthralgias and myalgias.  Skin: Negative for rash and wound.  Allergic/Immunologic: Negative for environmental allergies and food allergies.  Neurological: Negative for dizziness and headaches.  Hematological: Negative for adenopathy. Does not bruise/bleed easily.  Psychiatric/Behavioral: Negative for hallucinations and suicidal ideas. The patient is not nervous/anxious.     Blood pressure 98/82, pulse 70, temperature 97.8 F (36.6 C), temperature source Oral, resp. rate 18, height 5\' 1"  (1.549 m), weight 105 kg, last menstrual period 12/01/2019, SpO2 100 %, unknown if currently breastfeeding.Body mass index is 43.74 kg/m.  General Appearance: Casual  Eye Contact:  Good  Speech:  Clear and Coherent and Normal Rate  Volume:  Normal  Mood:  Euthymic  Affect:  Appropriate and Congruent  Thought Process:  Coherent and Goal Directed  Orientation:  Full (Time, Place, and Person)  Thought Content:  Logical  Suicidal Thoughts:  No  Homicidal Thoughts:  No  Memory:  Immediate;   Fair Recent;   Fair Remote;   NA  Judgement:  Fair  Insight:  Fair  Psychomotor Activity:  Normal  Concentration:  Concentration: Fair and Attention Span: Fair  Recall:  FiservFair  Fund of Knowledge:  Fair  Language:  Fair  Akathisia:  No  Handed:  Right  AIMS (if indicated):     Assets:  Communication Skills Desire for Improvement Resilience Social Support  ADL's:  Intact  Cognition:  WNL  Sleep:  Number of Hours: 6.25     Treatment Plan Summary: Daily contact with patient to assess and evaluate symptoms and progress in treatment and Medication management   Patient is a 24 year old female with the above-stated past psychiatric history who is seen in follow-up.  Chart reviewed. Patient discussed with nursing. Patient  reports mood improvement today likely in settings of both - after psychotropic medications were restarted and due to not having symptoms of acute opioid withdrawal currently. Denies unsafe thoughts, denies symptoms of psychosis, reports improved sleep. No side effects from medications reported. The current med combination will be continued. Iron levels within normal limits. Ropinerol 0.25 mg QHS for restless leg syndrome. Vistaril discontinued today.   Plan: -continue inpatient psych admission; 15-minute checks; daily contact with patient to assess and evaluate symptoms and progress in treatment; psychoeducation.  -continue scheduled medications: . benzoyl peroxide   Topical BID  . influenza vac split quadrivalent PF  0.5 mL Intramuscular Tomorrow-1000  . nicotine  21 mg Transdermal Q0600  . QUEtiapine  200 mg Oral QHS  . rOPINIRole  0.25 mg Oral QHS  . tuberculin  5 Units Intradermal Once    -continue PRN medications.  acetaminophen, alum & mag hydroxide-simeth, magnesium hydroxide  -Pertinent Labs:  HCV Quantitative >50 IU/mL 749,000   HCV Quantitative Log >1.70 log10 IU/mL 5.874   Infectious disease contacted about results, and they advise outpatient follow-up. Will contact [phone] to schedule appointment at discharge.    -EKG: on 11/4 showed Qtc 472 with normal sinus rhythm.   -Disposition: Estimated duration of hospitalization: Late this week vs early next week pending inpatient substance abuse treatment. [facility] Friday of this week. Requested 30-day supply of medication from pharmacy, and discontinued Vistaril. PPD test placed Tuesday, and will be read on Thursday. Covid swab completed Weds.   -  I certify that the patient does need, on a daily basis, active treatment furnished directly by or requiring the supervision of inpatient psychiatric facility personnel.   Jesse SansMegan M Astaria Nanez, MD 12/21/2019,  1:27 PM

## 2019-12-21 NOTE — Progress Notes (Signed)
Patient calm and cooperative during assessment, denying SI/HI/AVH. Patient observed interacting appropriately with staff and peers on the unit. Patient compliant with medication administration per MD orders. Patient given education, support, and encouragement to be active in her treatment plan. Patient being monitored Q 15 minutes for safety per unit protocol. Pt remains safe on the unit. 

## 2019-12-21 NOTE — Progress Notes (Signed)
Recreation Therapy Notes   Date: 12/21/2019  Time: 9:30 am   Location: Craft room   Behavioral response: Appropriate  Intervention Topic: Leisure    Discussion/Intervention:  Group content today was focused on leisure. The group defined what leisure is and some positive leisure activities they participate in. Individuals identified the difference between good and bad leisure. Participants expressed how they feel after participating in the leisure of their choice. The group discussed how they go about picking a leisure activity and if others are involved in their leisure activities. The patient stated how many leisure activities they have to choose from and reasons why it is important to have leisure time. Individuals participated in the intervention "Leisure Jeopardy" where they had a chance to identify new leisure activities as well as benefits of leisure. Clinical Observations/Feedback: Patient came to group and identified her leisure activities as four wheeling, getting her nails done, listening to music, and driving. She explained that leisure is important to free your mind. Individual was social with peers and staff while participating in the intervention.  Rajanae Mantia LRT/CTRS          Lourdez Mcgahan 12/21/2019 12:01 PM

## 2019-12-21 NOTE — Plan of Care (Signed)
Patient presents at her baseline, scheduled to D/C on Friday   Problem: Education: Goal: Emotional status will improve Outcome: Not Progressing Goal: Mental status will improve Outcome: Not Progressing

## 2019-12-21 NOTE — BHH Group Notes (Signed)
LCSW Group Therapy Note  12/21/2019 2:31 PM  Type of Therapy/Topic:  Group Therapy:  Emotion Regulation  Participation Level:  Active   Description of Group:   The purpose of this group is to assist patients in learning to regulate negative emotions and experience positive emotions. Patients will be guided to discuss ways in which they have been vulnerable to their negative emotions. These vulnerabilities will be juxtaposed with experiences of positive emotions or situations, and patients will be challenged to use positive emotions to combat negative ones. Special emphasis will be placed on coping with negative emotions in conflict situations, and patients will process healthy conflict resolution skills.  Therapeutic Goals: 1. Patient will identify two positive emotions or experiences to reflect on in order to balance out negative emotions 2. Patient will label two or more emotions that they find the most difficult to experience 3. Patient will demonstrate positive conflict resolution skills through discussion and/or role plays  Summary of Patient Progress: Patient was an active listener and contributed to the topic of discussion. Patient shared past experiences of sobriety and displayed incite regarding effective and ineffective sobriety plans.      Therapeutic Modalities:   Cognitive Behavioral Therapy Feelings Identification Dialectical Behavioral Therapy  Gwenevere Ghazi, MSW, [facility] 12/21/2019 2:31 PM

## 2019-12-21 NOTE — Progress Notes (Signed)
Patient calm and cooperative during assessment, denying SI/HI/AVH. Patient observed interacting appropriately with staff and peers on the unit. Patient compliant with medication administration per MD orders. Patient given education, support, and encouragement to be active in her treatment plan. Patient being monitored Q 15 minutes for safety per unit protocol. Pt remains safe on the unit.

## 2019-12-21 NOTE — Plan of Care (Signed)
D- Patient alert and oriented. Patient presents in a pleasant mood on assessment stating that she slept "fine" last night and had no complaints to voice to this Clinical research associate. Patient endorsed both depression and anxiety, however, she did not go into detail as to what was making her feel this way. Patient's only request was to shave, and this writer allowed her to do so. Patient denies SI, HI, AVH, and pain at this time. Patient's goal for today is to "stay positive".  A- Scheduled medications administered to patient, per MD orders. Support and encouragement provided.  Routine safety checks conducted every 15 minutes.  Patient informed to notify staff with problems or concerns.  R- No adverse drug reactions noted. Patient contracts for safety at this time. Patient compliant with medications and treatment plan. Patient receptive, calm, and cooperative. Patient interacts well with others on the unit.  Patient remains safe at this time.  Problem: Education: Goal: Knowledge of [place] [facility] Education information/materials will improve Outcome: Progressing Goal: Emotional status will improve Outcome: Progressing Goal: Mental status will improve Outcome: Progressing Goal: Verbalization of understanding the information provided will improve Outcome: Progressing   Problem: Activity: Goal: Interest or engagement in activities will improve Outcome: Progressing Goal: Sleeping patterns will improve Outcome: Progressing   Problem: Coping: Goal: Ability to verbalize frustrations and anger appropriately will improve Outcome: Progressing Goal: Ability to demonstrate self-control will improve Outcome: Progressing   Problem: Health Behavior/Discharge Planning: Goal: Identification of resources available to assist in meeting health care needs will improve Outcome: Progressing Goal: Compliance with treatment plan for underlying cause of condition will improve Outcome: Progressing   Problem: Physical  Regulation: Goal: Ability to maintain clinical measurements within normal limits will improve Outcome: Progressing   Problem: Safety: Goal: Periods of time without injury will increase Outcome: Progressing   Problem: Education: Goal: Knowledge of disease or condition will improve Outcome: Progressing Goal: Understanding of discharge needs will improve Outcome: Progressing   Problem: Health Behavior/Discharge Planning: Goal: Ability to identify changes in lifestyle to reduce recurrence of condition will improve Outcome: Progressing Goal: Identification of resources available to assist in meeting health care needs will improve Outcome: Progressing   Problem: Physical Regulation: Goal: Complications related to the disease process, condition or treatment will be avoided or minimized Outcome: Progressing   Problem: Safety: Goal: Ability to remain free from injury will improve Outcome: Progressing   Problem: Self-Concept: Goal: Level of anxiety will decrease Outcome: Progressing Goal: Ability to modify response to factors that promote anxiety will improve Outcome: Progressing

## 2019-12-21 NOTE — Plan of Care (Signed)
°  Problem: Group Participation °Goal: STG - Patient will engage in groups without prompting or encouragement from LRT x3 group sessions within 5 recreation therapy group sessions °Description: STG - Patient will engage in groups without prompting or encouragement from LRT x3 group sessions within 5 recreation therapy group sessions °Outcome: Progressing °  °

## 2019-12-22 DIAGNOSIS — F319 Bipolar disorder, unspecified: Secondary | ICD-10-CM | POA: Diagnosis not present

## 2019-12-22 NOTE — Plan of Care (Signed)
D- Patient alert and oriented. Patient presents in a pleasant mood on assessment stating that she slept ok last night and had no complaints to voice to this Clinical research associate. Patient reported that her depression is "not too bad", however, she's anxious about leaving, "I'm ready to go". Patient denies SI, HI, AVH, and pain at this time. Patient's goal for today is "myself/discharge tomorrow", in which she will "pray, participate", in order to achieve her goal.  A- Scheduled medications administered to patient, per MD orders. Support and encouragement provided.  Routine safety checks conducted every 15 minutes.  Patient informed to notify staff with problems or concerns.  R- No adverse drug reactions noted. Patient contracts for safety at this time. Patient compliant with medications and treatment plan. Patient receptive, calm, and cooperative. Patient interacts well with others on the unit.  Patient remains safe at this time.  Problem: Education: Goal: Knowledge of [place] [facility] Education information/materials will improve Outcome: Progressing Goal: Emotional status will improve Outcome: Progressing Goal: Mental status will improve Outcome: Progressing Goal: Verbalization of understanding the information provided will improve Outcome: Progressing   Problem: Activity: Goal: Interest or engagement in activities will improve Outcome: Progressing Goal: Sleeping patterns will improve Outcome: Progressing   Problem: Coping: Goal: Ability to verbalize frustrations and anger appropriately will improve Outcome: Progressing Goal: Ability to demonstrate self-control will improve Outcome: Progressing   Problem: Health Behavior/Discharge Planning: Goal: Identification of resources available to assist in meeting health care needs will improve Outcome: Progressing Goal: Compliance with treatment plan for underlying cause of condition will improve Outcome: Progressing   Problem: Physical  Regulation: Goal: Ability to maintain clinical measurements within normal limits will improve Outcome: Progressing   Problem: Safety: Goal: Periods of time without injury will increase Outcome: Progressing   Problem: Education: Goal: Knowledge of disease or condition will improve Outcome: Progressing Goal: Understanding of discharge needs will improve Outcome: Progressing   Problem: Health Behavior/Discharge Planning: Goal: Ability to identify changes in lifestyle to reduce recurrence of condition will improve Outcome: Progressing Goal: Identification of resources available to assist in meeting health care needs will improve Outcome: Progressing   Problem: Physical Regulation: Goal: Complications related to the disease process, condition or treatment will be avoided or minimized Outcome: Progressing   Problem: Safety: Goal: Ability to remain free from injury will improve Outcome: Progressing   Problem: Self-Concept: Goal: Level of anxiety will decrease Outcome: Progressing Goal: Ability to modify response to factors that promote anxiety will improve Outcome: Progressing

## 2019-12-22 NOTE — Progress Notes (Signed)
Patient calm and cooperative during assessment, denying SI/HI/AVH. Patient observed interacting appropriately with staff and peers on the unit. Patient compliant with medication administration per MD orders. Patient given education, support, and encouragement to be active in her treatment plan. Patient being monitored Q 15 minutes for safety per unit protocol. Pt remains safe on the unit. 

## 2019-12-22 NOTE — Plan of Care (Signed)
Patient presents at her baseline   Problem: Education: Goal: Emotional status will improve Outcome: Not Progressing Goal: Mental status will improve Outcome: Not Progressing   

## 2019-12-22 NOTE — Progress Notes (Signed)
Patient's PPD was read by this Clinical research associate and was negative. This Clinical research associate notified Dr. Toni Amend via secure chat and is waiting for a response.

## 2019-12-22 NOTE — BHH Counselor (Signed)
CSW contacted [employer] and spoke with [employer].  She reports that the requested amount is covered.  Funds are sent directly to [facility].   Penni Homans, MSW, LCSW 12/22/2019 10:29 AM

## 2019-12-22 NOTE — Progress Notes (Signed)
[facility] MD Progress Note  12/22/2019 7:12 AM Jill Shepherd  MRN:  [id number] Subjective: Follow-up for this patient with bipolar disorder and substance abuse.  Patient with no new complaints.  Slept well last night.  Much calmer and less confused than on admission.  Has been accepted to go to inpatient substance abuse treatment on Friday and is agreeable to that.  No change needed likely for today. Principal Problem: Bipolar 1 disorder, depressed (HCC) Diagnosis: Principal Problem:   Bipolar 1 disorder, depressed (HCC) Active Problems:   Anxiety   Opiate abuse, continuous (HCC)   Methamphetamine abuse (HCC)   Amphetamine and psychostimulant-induced psychosis with hallucinations (HCC)  Total Time spent with patient: 30 minutes  Past Psychiatric History: Past history of recurrent psychosis substance abuse poor outpatient functioning gradual decline of overall functioning  Past Medical History:  Past Medical History:  Diagnosis Date  . Drug abuse (HCC)   . Headache   . SAB (spontaneous abortion) 01/19/2014   stillborn at 20.4 wks    Past Surgical History:  Procedure Laterality Date  . INCISION AND DRAINAGE ABSCESS Left 05/07/2019   Procedure: INCISION AND DRAINAGE ABSCESS;  Surgeon: Leafy Ro, MD;  Location: [facility] ORS;  Service: General;  Laterality: Left;   Family History:  Family History  Problem Relation Age of Onset  . Hypertension Father   . Hypertension Paternal Grandfather    Family Psychiatric  History: See previous Social History:  Social History   Substance and Sexual Activity  Alcohol Use No  . Alcohol/week: 0.0 standard drinks     Social History   Substance and Sexual Activity  Drug Use Yes  . Types: Other-see comments, Amphetamines, Fentanyl, Heroin   Comment: subutex tx, snorted heroin last on 5/8    Social History   Socioeconomic History  . Marital status: Legally Separated    Spouse name: Not on file  . Number of children: 1  . Years of  education: Not on file  . Highest education level: Not on file  Occupational History    Employer: [employer]  Tobacco Use  . Smoking status: Never Smoker  . Smokeless tobacco: Never Used  Vaping Use  . Vaping Use: Never used  Substance and Sexual Activity  . Alcohol use: No    Alcohol/week: 0.0 standard drinks  . Drug use: Yes    Types: Other-see comments, Amphetamines, Fentanyl, Heroin    Comment: subutex tx, snorted heroin last on 5/8  . Sexual activity: Yes    Partners: Male    Birth control/protection: None  Other Topics Concern  . Not on file  Social History Narrative  . Not on file   Social Determinants of Health   Financial Resource Strain:   . Difficulty of Paying Living Expenses: Not on file  Food Insecurity:   . Worried About Programme researcher, broadcasting/film/video in the Last Year: Not on file  . Ran Out of Food in the Last Year: Not on file  Transportation Needs:   . Lack of Transportation (Medical): Not on file  . Lack of Transportation (Non-Medical): Not on file  Physical Activity:   . Days of Exercise per Week: Not on file  . Minutes of Exercise per Session: Not on file  Stress:   . Feeling of Stress : Not on file  Social Connections:   . Frequency of Communication with Friends and Family: Not on file  . Frequency of Social Gatherings with Friends and Family: Not on file  . Attends  Religious Services: Not on file  . Active Member of Clubs or Organizations: Not on file  . Attends BankerClub or Organization Meetings: Not on file  . Marital Status: Not on file   Additional Social History:                         Sleep: Fair  Appetite:  Poor  Current Medications: Current Facility-Administered Medications  Medication Dose Route Frequency Provider Last Rate Last Admin  . acetaminophen (TYLENOL) tablet 650 mg  650 mg Oral Q6H PRN Juandedios Dudash, Jackquline DenmarkJohn T, MD   650 mg at 12/21/19 2114  . alum & mag hydroxide-simeth (MAALOX/MYLANTA) 200-200-20 MG/5ML suspension 30 mL  30 mL Oral  Q4H PRN Odyn Turko T, MD   30 mL at 12/19/19 1710  . benzoyl peroxide 5 % gel   Topical BID Jesse SansFreeman, Megan M, MD   Given at 12/21/19 1726  . influenza vac split quadrivalent PF (FLUARIX) injection 0.5 mL  0.5 mL Intramuscular Tomorrow-1000 Jesse SansFreeman, Megan M, MD      . magnesium hydroxide (MILK OF MAGNESIA) suspension 30 mL  30 mL Oral Daily PRN Krimson Massmann T, MD      . nicotine (NICODERM CQ - dosed in mg/24 hours) patch 21 mg  21 mg Transdermal Q0600 Anju Sereno, Jackquline DenmarkJohn T, MD   21 mg at 12/21/19 0828  . QUEtiapine (SEROQUEL) tablet 200 mg  200 mg Oral QHS Jesse SansFreeman, Megan M, MD   200 mg at 12/21/19 2114  . rOPINIRole (REQUIP) tablet 0.25 mg  0.25 mg Oral QHS Jesse SansFreeman, Megan M, MD   0.25 mg at 12/21/19 2114  . tuberculin injection 5 Units  5 Units Intradermal Once Jesse SansFreeman, Megan M, MD   5 Units at 12/20/19 1523    Lab Results:  Results for orders placed or performed during the hospital encounter of 12/07/19 (from the past 48 hour(s))  SARS Coronavirus 2 by RT PCR (hospital order, performed in [facility] hospital lab) Nasopharyngeal Nasopharyngeal Swab     Status: None   Collection Time: 12/20/19 11:28 AM   Specimen: Nasopharyngeal Swab  Result Value Ref Range   SARS Coronavirus 2 NEGATIVE NEGATIVE    Comment: (NOTE) SARS-CoV-2 target nucleic acids are NOT DETECTED.  The SARS-CoV-2 RNA is generally detectable in upper and lower respiratory specimens during the acute phase of infection. The lowest concentration of SARS-CoV-2 viral copies this assay can detect is 250 copies / mL. A negative result does not preclude SARS-CoV-2 infection and should not be used as the sole basis for treatment or other patient management decisions.  A negative result may occur with improper specimen collection / handling, submission of specimen other than nasopharyngeal swab, presence of viral mutation(s) within the areas targeted by this assay, and inadequate number of viral copies (<250 copies / mL). A negative  result must be combined with clinical observations, patient history, and epidemiological information.  Fact Sheet for Patients:   BoilerBrush.com.cyhttps://www.fda.gov/media/136312/download  Fact Sheet for Healthcare Providers: https://pope.com/https://www.fda.gov/media/136313/download  This test is not yet approved or  cleared by the [place] FDA and has been authorized for detection and/or diagnosis of SARS-CoV-2 by FDA under an Emergency Use Authorization (EUA).  This EUA will remain in effect (meaning this test can be used) for the duration of the COVID-19 declaration under Section 564(b)(1) of the Act, 21 U.S.C. section 360bbb-3(b)(1), unless the authorization is terminated or revoked sooner.  Performed at [facility], [address] [place], [place] [address]  Blood Alcohol level:  Lab Results  Component Value Date   ETH <10 12/06/2019   ETH <10 12/01/2019    Metabolic Disorder Labs: No results found for: HGBA1C, MPG No results found for: PROLACTIN No results found for: CHOL, TRIG, HDL, CHOLHDL, VLDL, LDLCALC  Physical Findings: AIMS: Facial and Oral Movements Muscles of Facial Expression: None, normal Lips and Perioral Area: None, normal Jaw: None, normal Tongue: None, normal,Extremity Movements Upper (arms, wrists, hands, fingers): None, normal Lower (legs, knees, ankles, toes): None, normal, Trunk Movements Neck, shoulders, hips: None, normal, Overall Severity Severity of abnormal movements (highest score from questions above): None, normal Incapacitation due to abnormal movements: None, normal Patient's awareness of abnormal movements (rate only patient's report): No Awareness, Dental Status Current problems with teeth and/or dentures?: No Does patient usually wear dentures?: No  CIWA:    COWS:     Musculoskeletal: Strength & Muscle Tone: within normal limits Gait & Station: normal Patient leans: N/A  Psychiatric Specialty Exam: Physical Exam Vitals and nursing  note reviewed.  Constitutional:      Appearance: She is well-developed.  HENT:     Head: Normocephalic and atraumatic.  Eyes:     Conjunctiva/sclera: Conjunctivae normal.     Pupils: Pupils are equal, round, and reactive to light.  Cardiovascular:     Heart sounds: Normal heart sounds.  Pulmonary:     Effort: Pulmonary effort is normal.  Abdominal:     Palpations: Abdomen is soft.  Musculoskeletal:        General: Normal range of motion.     Cervical back: Normal range of motion.  Skin:    General: Skin is warm and dry.  Neurological:     General: No focal deficit present.     Mental Status: She is alert.  Psychiatric:        Attention and Perception: Attention normal.        Mood and Affect: Mood is anxious.        Speech: Speech normal.        Behavior: Behavior normal.        Thought Content: Thought content normal.        Cognition and Memory: Cognition normal.     Review of Systems  Constitutional: Negative.   HENT: Negative.   Eyes: Negative.   Respiratory: Negative.   Cardiovascular: Negative.   Gastrointestinal: Negative.   Musculoskeletal: Negative.   Skin: Negative.   Neurological: Negative.   Psychiatric/Behavioral: Negative.     Blood pressure 117/71, pulse 67, temperature 97.7 F (36.5 C), temperature source Oral, resp. rate 18, height 5\' 1"  (1.549 m), weight 105 kg, last menstrual period 12/01/2019, SpO2 96 %, unknown if currently breastfeeding.Body mass index is 43.74 kg/m.  General Appearance: Casual  Eye Contact:  Good  Speech:  Clear and Coherent  Volume:  Normal  Mood:  Euthymic  Affect:  Congruent  Thought Process:  Goal Directed  Orientation:  Full (Time, Place, and Person)  Thought Content:  Logical  Suicidal Thoughts:  No  Homicidal Thoughts:  No  Memory:  Immediate;   Fair Recent;   Fair Remote;   Fair  Judgement:  Fair  Insight:  Fair  Psychomotor Activity:  Normal  Concentration:  Concentration: Fair  Recall:  13/04/2019 of  Knowledge:  Fair  Language:  Fair  Akathisia:  No  Handed:  Right  AIMS (if indicated):     Assets:  Desire for Improvement Housing Physical Health  ADL's:  Intact  Cognition:  WNL  Sleep:  Number of Hours: 6.75     Treatment Plan Summary: Daily contact with patient to assess and evaluate symptoms and progress in treatment, Medication management and Plan No change to medicine.  Plans are in place for likely transfer to [facility] tomorrow.  Mordecai Rasmussen, MD 12/22/2019, 7:12 AM

## 2019-12-22 NOTE — BHH Counselor (Signed)
CSW attempted to follow up with Olegario Messier at [facility].  CSW left HIPAA compliant voicemail.  Penni Homans, MSW, LCSW 12/22/2019 10:24 AM

## 2019-12-22 NOTE — Progress Notes (Signed)
MD notified this writer of receiving patient's PPD results.

## 2019-12-23 MED ORDER — QUETIAPINE FUMARATE 200 MG PO TABS: 200.0000 mg | ORAL_TABLET | Freq: Every day | ORAL | 0 refills | Status: DC

## 2019-12-23 MED ORDER — ROPINIROLE HCL 0.25 MG PO TABS: 0.2500 mg | ORAL_TABLET | Freq: Every day | ORAL | 0 refills | Status: DC

## 2019-12-23 NOTE — Progress Notes (Signed)
D- Patient alert and oriented. Patient presents in a pleasant mood on assessment stating that she slept ok last night and had no complaints to voice to this Clinical research associate. Patient endorsed anxiety, stating that she's anxious about going to a new environment today. Patient denies SI, HI, AVH, and pain at this time. Patient had no stated goals for today.  A- Scheduled medications administered to patient, per MD orders. Support and encouragement provided.  Routine safety checks conducted every 15 minutes.  Patient informed to notify staff with problems or concerns.  R- No adverse drug reactions noted. Patient contracts for safety at this time. Patient compliant with medications and treatment plan. Patient receptive, calm, and cooperative. Patient interacts well with others on the unit.  Patient remains safe at this time.

## 2019-12-23 NOTE — Plan of Care (Signed)
  Problem: Group Participation Goal: STG - Patient will engage in groups without prompting or encouragement from LRT x3 group sessions within 5 recreation therapy group sessions Description: STG - Patient will engage in groups without prompting or encouragement from LRT x3 group sessions within 5 recreation therapy group sessions Outcome: Completed/Met

## 2019-12-23 NOTE — Progress Notes (Signed)
  [facility] Adult Case Management Discharge Plan :  Will you be returning to the same living situation after discharge:  No.  Patient admitted to [facility] for SA needs. At discharge, do you have transportation home?: Yes,  CSW will assist patient in tranpsortation needs. Do you have the ability to pay for your medications: No.  Release of information consent forms completed and in the chart;  Patient's signature needed at discharge.  Patient to Follow up at:  Follow-up Information    [facility] Follow up.   Specialty: Addiction Medicine Why: You are on the wailtlist.  Thanks! Contact information: [address] [place] [address] [phone]        [facility] Follow up.   Why: Pt has been reviewed, awaiting bed date. No estimated beddate (12/19/19). Contact information: [address] [place] [address] [phone]        [facility] on 12/23/2019.   Why: Your appointment is on 12/23/2019 at 12:30PM.  Thanks! Contact information: [address] [place] [address] [phone]               Next level of care provider has access to [facility] Link:no  Safety Planning and Suicide Prevention discussed: Yes,  SPE completed with patient and mother.  Have you used any form of tobacco in the last 30 days? (Cigarettes, Smokeless Tobacco, Cigars, and/or Pipes): No  Has patient been referred to the Quitline?: N/A patient is not a smoker  Patient has been referred for addiction treatment: Yes  Harden Mo, LCSW 12/23/2019, 8:57 AM

## 2019-12-23 NOTE — Discharge Summary (Addendum)
Physician Discharge Summary Note  Patient:  Jill Shepherd is an 24 y.o., female MRN:  [id number] DOB:  12/13/1995 Patient phone:  [phone] (home)  Patient address:   [address] [place] [address],  Total Time spent with patient: 30 minutes  Date of Admission:  12/07/2019 Date of Discharge: 12/23/2019  Reason for Admission:  psychosis, s/i  Hospital course Patient denies any suicidal or homicidal ideations.  She contracts for safety.  She is not any medical or psychiatric distress.  She is tolerating medications well.  She will be going to chemical dependency treatment in [facility].  If the patient develops suicidal or homicidal ideations, please call 911 or go to the local emergency department.  Patient voices agreement and understanding.  She is grateful for the care that she received here.  No access to guns.  Denies any cravings for drugs or alcohol.  Subjective Data: 24 year old female who presented to hospital under IVC for acute psychosis, agitation, and verbalization of suicidal ideation to mother. While in the ED, patient was able to sober and consent for voluntary admission to our unit. She reports that she has been off all her psychiatric medications (Seroquel and hydroxyzine) for several months, and has not been following-up with [facility]. She notes that off her medications she becomes very labile. She will go days without sleeping, increase activity, and feel very irritable. She notes that she will have explosive anger outbursts towards her mother and brother, and also have several crying spells throughout the day. She appears manic on exam with pressured speech, mood lability, and flight of ideas. She admits to relapsing on heroin and meth. She was previously working at [employer], but has been unemployed for a month. She states she "does whatever it takes" to get the $160 dollars worth of heroin and meth she uses daily. She states she almost  always uses her own personal needles, but has shared needles on occasion. She requests to be tested for Hepatitis, HIV, RPR, gonorrhea and chlamydia. She states that she uses drugs sometimes out of boredom, and also at times to self-medicate. She is aware that it causes her to become psychotic and act out aggressively towards family. She is frustrated with herself for relapsing so many times. She previously attended [facility] intensive outpatient program. She feels that she may need inpatient detox treatment at this time.    Principal Problem: Bipolar 1 disorder, depressed (HCC) Discharge Diagnoses: Principal Problem:   Bipolar 1 disorder, depressed (HCC) Active Problems:   Anxiety   Opiate abuse, continuous (HCC)   Methamphetamine abuse (HCC)   Amphetamine and psychostimulant-induced psychosis with hallucinations (HCC)    Past Medical History:  Past Medical History:  Diagnosis Date  . Drug abuse (HCC)   . Headache   . SAB (spontaneous abortion) 01/19/2014   stillborn at 20.4 wks    Past Surgical History:  Procedure Laterality Date  . INCISION AND DRAINAGE ABSCESS Left 05/07/2019   Procedure: INCISION AND DRAINAGE ABSCESS;  Surgeon: Leafy Ro, MD;  Location: [facility] ORS;  Service: General;  Laterality: Left;   Family History:  Family History  Problem Relation Age of Onset  . Hypertension Father   . Hypertension Paternal Grandfather     Social History:  Social History   Substance and Sexual Activity  Alcohol Use No  . Alcohol/week: 0.0 standard drinks     Social History   Substance and Sexual Activity  Drug Use Yes  .  Types: Other-see comments, Amphetamines, Fentanyl, Heroin   Comment: subutex tx, snorted heroin last on 5/8    Social History   Socioeconomic History  . Marital status: Legally Separated    Spouse name: Not on file  . Number of children: 1  . Years of education: Not on file  . Highest education level: Not on file  Occupational History    Employer:  [employer]  Tobacco Use  . Smoking status: Never Smoker  . Smokeless tobacco: Never Used  Vaping Use  . Vaping Use: Never used  Substance and Sexual Activity  . Alcohol use: No    Alcohol/week: 0.0 standard drinks  . Drug use: Yes    Types: Other-see comments, Amphetamines, Fentanyl, Heroin    Comment: subutex tx, snorted heroin last on 5/8  . Sexual activity: Yes    Partners: Male    Birth control/protection: None  Other Topics Concern  . Not on file  Social History Narrative  . Not on file   Social Determinants of Health   Financial Resource Strain:   . Difficulty of Paying Living Expenses: Not on file  Food Insecurity:   . Worried About Programme researcher, broadcasting/film/video in the Last Year: Not on file  . Ran Out of Food in the Last Year: Not on file  Transportation Needs:   . Lack of Transportation (Medical): Not on file  . Lack of Transportation (Non-Medical): Not on file  Physical Activity:   . Days of Exercise per Week: Not on file  . Minutes of Exercise per Session: Not on file  Stress:   . Feeling of Stress : Not on file  Social Connections:   . Frequency of Communication with Friends and Family: Not on file  . Frequency of Social Gatherings with Friends and Family: Not on file  . Attends Religious Services: Not on file  . Active Member of Clubs or Organizations: Not on file  . Attends Banker Meetings: Not on file  . Marital Status: Not on file    Physical Findings: AIMS: Facial and Oral Movements Muscles of Facial Expression: None, normal Lips and Perioral Area: None, normal Jaw: None, normal Tongue: None, normal,Extremity Movements Upper (arms, wrists, hands, fingers): None, normal Lower (legs, knees, ankles, toes): None, normal, Trunk Movements Neck, shoulders, hips: None, normal, Overall Severity Severity of abnormal movements (highest score from questions above): None, normal Incapacitation due to abnormal movements: None, normal Patient's awareness of  abnormal movements (rate only patient's report): No Awareness, Dental Status Current problems with teeth and/or dentures?: No Does patient usually wear dentures?: No  CIWA:    COWS:       Musculoskeletal: Strength & Muscle Tone:within normal limits Gait & Station:normal Patient leans:N/A  Psychiatric Specialty Exam: Physical Exam Vitalsand nursing notereviewed.  Constitutional:  Appearance: Normal appearance.  HENT:  Head: Normocephalicand atraumatic.  Nose: Nose normal.  Mouth/Throat:  Mouth: Mucous membranes are moist.  Pharynx: Oropharynx is clear.  Eyes:  Extraocular Movements: Extraocular movements intact.  Conjunctiva/sclera: Conjunctivae normal.  Pupils: Pupils are equal, round, and reactive to light.  Cardiovascular:  Rate and Rhythm: Pulses: Normal pulses.  Pulmonary:  Effort: Pulmonary effort is normal.  Breath sounds: Normal breath sounds.  Abdominal:  General: Abdomen is flat.  Palpations: Abdomen is soft.  Musculoskeletal:  General: No swelling.Normal range of motion.  Skin: General: Skin is warmand dry.  Neurological:  General: No focal deficitpresent.  Mental Status: She is alertand oriented to person, place, and time.  Psychiatric:   Mood is good, affect bright. Logical linear and goal directed Gait and station within normal limits    Review of Systems  Constitutiona  HENT: Negative forrhinorrheaand sore throat.  Eyes: Negative forphotophobiaand visual disturbance.  Respiratory: Negative forcoughand shortness of breath.  Cardiovascular: Negative forchest painand palpitations.  Gastrointestinal: Negative forconstipationand vomiting.  Endocrine: Negative forcold intoleranceand heat intolerance.  Genitourinary: Negative fordifficulty urinatingand dysuria.  Musculoskeletal: Negative forarthralgiasand myalgias.  Skin: Negative forrashand wound.   Allergic/Immunologic: Negative forenvironmental allergiesand food allergies.  Neurological: Negative fordizzinessand headaches.  Hematological: Negative foradenopathy.Does not bruise/bleed easily.  Psychiatric/Behavioral- negative     General Appearance: Good grooming  Eye Contact: Good  Speech: Within normal limits  Volume: Within normal limits  Mood: Happy  Affect: Full  Thought Process:Disorganized  Orientation:Full (Time, Place, and Person)  Thought Content:Tangential  Suicidal Thoughts: no  Homicidal Thoughts:No  Memory:Good  Judgement:Impaired  Insight:good  Psychomotor Activity:Increased  Concentration:Concentration:Poorand Attention Span: Poor  Recall:good  Fund of Knowledge:Good  Language:Good  Akathisia:Negative  Handed:Right  AIMS (if indicated):   Assets:Communication Skills Desire for Improvement Resilience Social Support  ADL's:Intact  Cognition:I good  Sleep:Number of Hours: 6.45      Discharge Instructions    Diet general   Complete by: As directed    Increase activity slowly   Complete by: As directed      Allergies as of 12/23/2019   No Known Allergies     Medication List    STOP taking these medications   hydrOXYzine 50 MG tablet Commonly known as: ATARAX/VISTARIL     TAKE these medications     Indication  QUEtiapine 200 MG tablet Commonly known as: SEROQUEL Take 1 tablet (200 mg total) by mouth at bedtime. What changed:   medication strength  how much to take  Indication: Major Depressive Disorder   rOPINIRole 0.25 MG tablet Commonly known as: REQUIP Take 1 tablet (0.25 mg total) by mouth at bedtime.  Indication: Restless Leg Syndrome       Follow-up Information    [facility] Follow up.   Specialty: Addiction Medicine Why: You are on the wailtlist.  Thanks! Contact information: [address] [place]  [address] [phone]        [facility] Follow up.   Why: Pt has been reviewed, awaiting bed date. No estimated beddate (12/19/19). Contact information: [address] [place] [address] [phone]        [facility] on 12/23/2019.   Why: Your appointment is on 12/23/2019 at 12:30PM.  Thanks! Contact information: [address] [place] [address] [phone]               Follow-up recommendations:  Activity:  as tolerated   Signed: Reggie Pile, MD 12/23/2019, 9:09 AM

## 2019-12-23 NOTE — Progress Notes (Signed)
Recreation Therapy Notes  INPATIENT RECREATION TR PLAN  Patient Details Name: Jill Shepherd MRN: [id number] DOB: [birth year] Today's Date: 12/23/2019  Rec Therapy Plan Is patient appropriate for Therapeutic Recreation?: Yes Treatment times per week: at least 3 Estimated Length of Stay: 5-7 days TR Treatment/Interventions: Group participation (Comment)  Discharge Criteria Pt will be discharged from therapy if:: Discharged Treatment plan/goals/alternatives discussed and agreed upon by:: Patient/family  Discharge Summary Short term goals set: Patient will engage in groups without prompting or encouragement from LRT x3 group sessions within 5 recreation therapy group sessions Short term goals met: Complete Progress toward goals comments: Groups attended Which groups?: Self-esteem, Leisure education, Goal setting, Stress management, Other (Comment) (Problem Solving, Happiness) Reason goals not met: N/A Therapeutic equipment acquired: N/A Reason patient discharged from therapy: Discharge from hospital Pt/family agrees with progress & goals achieved: Yes Date patient discharged from therapy: 12/30/19   Gracin Mcpartland 12/23/2019, 11:39 AM

## 2019-12-23 NOTE — BHH Suicide Risk Assessment (Deleted)
Show:Clear all [x] Manual[x] Template[] Copied  Added by: [x] , MD  [] Hover for details [facility] Admission Suicide Risk Assessment   Nursing information obtained from:  Patient Demographic factors:  Unemployed, Caucasian, Adolescent or young adult Current Mental Status:  NA Loss Factors:  NA Historical Factors:  NA Risk Reduction Factors:  NA  Total Time spent with patient: 45 minutes Principal Problem: Bipolar 1 disorder, depressed (HCC) Diagnosis:  Principal Problem:   Bipolar 1 disorder, depressed (HCC) Active Problems:   Anxiety   Opiate abuse, continuous (HCC)   Methamphetamine abuse (HCC)   Amphetamine and psychostimulant-induced psychosis with hallucinations [facility])  Hospital course Patient denies any suicidal homicidal ideations.  She contracts for safety.  She is not any medical psychiatric distress.  She is tolerating medications well.  She will be going to chemical dependency treatment in [facility]  If the patient develops suicidal or homicidal ideations, please call 911 or go to the local emergency department.  Patient voices agreement and understanding.  Subjective Data: 24 year old female who presented to hospital under IVC for acute psychosis, agitation, and verbalization of suicidal ideation to mother. While in the ED, patient was able to sober and consent for voluntary admission to our unit. She reports that she has been off all her psychiatric medications (Seroquel and hydroxyzine) for several months, and has not been following-up with [facility]. She notes that off her medications she becomes very labile. She will go days without sleeping, increase activity, and feel very irritable. She notes that she will have explosive anger outbursts towards her mother and brother, and also have several crying spells throughout the day. She appears manic on exam with pressured speech, mood lability, and flight of ideas. She admits to relapsing on heroin  and meth. She was previously working at [employer], but has been unemployed for a month. She states she "does whatever it takes" to get the $160 dollars worth of heroin and meth she uses daily. She states she almost always uses her own personal needles, but has shared needles on occasion. She requests to be tested for Hepatitis, HIV, RPR, gonorrhea and chlamydia. She states that she uses drugs sometimes out of boredom, and also at times to self-medicate. She is aware that it causes her to become psychotic and act out aggressively towards family. She is frustrated with herself for relapsing so many times. She previously attended [facility] intensive outpatient program. She feels that she may need inpatient detox treatment at this time.   Continued Clinical Symptoms:  Alcohol Use Disorder Identification Test Final Score (AUDIT): 0 The "Alcohol Use Disorders Identification Test", Guidelines for Use in Primary Care, Second Edition.  [facility]). Score between 0-7:  no or low risk or alcohol related problems. Score between 8-15:  moderate risk of alcohol related problems. Score between 16-19:  high risk of alcohol related problems. Score 20 or above:  warrants further diagnostic evaluation for alcohol dependence and treatment.   CLINICAL FACTORS:   Severe Anxiety and/or Agitation Bipolar Disorder:   Mixed State Alcohol/Substance Abuse/Dependencies More than one psychiatric diagnosis Currently Psychotic Unstable or Poor Therapeutic Relationship Previous Psychiatric Diagnoses and Treatments   Musculoskeletal: Strength & Muscle Tone: within normal limits Gait & Station: normal Patient leans: N/A  Psychiatric Specialty Exam: Physical Exam Vitals and nursing note reviewed.  Constitutional:      Appearance: Normal appearance.  HENT:     Head: Normocephalic and atraumatic.     Nose: Nose normal.  Mouth/Throat:     Mouth: Mucous membranes are moist.     Pharynx:  Oropharynx is clear.  Eyes:     Extraocular Movements: Extraocular movements intact.     Conjunctiva/sclera: Conjunctivae normal.     Pupils: Pupils are equal, round, and reactive to light.  Cardiovascular:     Rate and Rhythm:    Pulses: Normal pulses.  Pulmonary:     Effort: Pulmonary effort is normal.     Breath sounds: Normal breath sounds.  Abdominal:     General: Abdomen is flat.     Palpations: Abdomen is soft.  Musculoskeletal:        General: No swelling. Normal range of motion.  Skin:    General: Skin is warm and dry.  Neurological:     General: No focal deficit present.     Mental Status: She is alert and oriented to person, place, and time.  Psychiatric:         Mood is good, affect bright. Logical linear and goal directed Gait and station within normal limits    Review of Systems  Constitutiona  HENT: Negative for rhinorrhea and sore throat.   Eyes: Negative for photophobia and visual disturbance.  Respiratory: Negative for cough and shortness of breath.   Cardiovascular: Negative for chest pain and palpitations.  Gastrointestinal: Negative for constipation and vomiting.  Endocrine: Negative for cold intolerance and heat intolerance.  Genitourinary: Negative for difficulty urinating and dysuria.  Musculoskeletal: Negative for arthralgias and myalgias.  Skin: Negative for rash and wound.  Allergic/Immunologic: Negative for environmental allergies and food allergies.  Neurological: Negative for dizziness and headaches.  Hematological: Negative for adenopathy. Does not bruise/bleed easily.  Psychiatric/Behavioral- negative     General Appearance:  Good grooming  Eye Contact:   Good  Speech:   Within normal limits  Volume:   Within normal limits  Mood:   Happy  Affect:  Full  Thought Process:  Disorganized  Orientation:  Full (Time, Place, and Person)  Thought Content:  Tangential  Suicidal Thoughts:  no  Homicidal Thoughts:  No  Memory:  Good    Judgement:  Impaired  Insight:  Fair  Psychomotor Activity:  Increased  Concentration:  Concentration: Poor and Attention Span: Poor  Recall:  Fair  Fund of Knowledge:  Good  Language:  Good  Akathisia:  Negative  Handed:  Right  AIMS (if indicated):     Assets:  Communication Skills Desire for Improvement Resilience Social Support  ADL's:  Intact  Cognition:  I good  Sleep:  Number of Hours: 6.45      COGNITIVE FEATURES THAT CONTRIBUTE TO RISK:  Loss of executive function    SUICIDE RISK:  Not applicable.  Patient denies have any thoughts of suicide today.  No homicidal thoughts.  PLAN OF CARE:  Patient will be discharged to a chemical benzo facility in [facility].

## 2019-12-23 NOTE — Progress Notes (Signed)
Patient ID: Jill Shepherd, female   DOB: 01/05/1996, 24 y.o.   MRN: [id number]  Discharge Note:  Patient denies SI/HIAVH at this time. Discharge instructions, AVS, prescriptions, 7-day supply, and transition record gone over with patient. Patient agrees to comply with medication management, follow-up visit, and outpatient therapy. Patient belongings returned to patient. Patient questions and concerns addressed and answered. Patient ambulatory off unit. Patient discharged to [facility], via [employer].

## 2019-12-23 NOTE — BHH Counselor (Signed)
CSW received call from [facility] substance use treatment facility, where patient was recently discharged. A representative requested updated HEP C labs. CSW faxed results. Patient provided consent prior to discharge.   Casimiro Needle, MSW, Lithium, LCASA 12/23/2019 3:24 PM

## 2019-12-23 NOTE — BHH Suicide Risk Assessment (Signed)
[facility] Discharge Suicide Risk Assessment   Principal Problem: Bipolar 1 disorder, depressed (HCC) Discharge Diagnoses: Principal Problem:   Bipolar 1 disorder, depressed (HCC) Active Problems:   Anxiety   Opiate abuse, continuous (HCC)   Methamphetamine abuse (HCC)   Amphetamine and psychostimulant-induced psychosis with hallucinations (HCC)   Nursing information obtained from:Patient Demographic factors:Unemployed, Caucasian, Adolescent or young adult Current Mental Status:NA Loss Factors:NA Historical Factors:NA Risk Reduction Factors:NA  Total Time spent with patient:45 minutes Principal Problem:Bipolar 1 disorder, depressed (HCC) Diagnosis:Principal Problem: Bipolar 1 disorder, depressed (HCC) Active Problems: Anxiety Opiate abuse, continuous (HCC) 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  She contracts for safety.  She is not any medical psychiatric distress.  She is tolerating medications well.  She will be going to chemical dependency treatment in [facility].  If the patient develops suicidal or homicidal ideations, please call 911 or go to the local emergency department.  Patient voices agreement and understanding.  Subjective Data:24 year old female who presented to hospital under IVC for acute psychosis, agitation, and verbalization of suicidal ideation to mother. While in the ED, patient was able to sober and consent for voluntary admission to our unit. She reports that she has been off all her psychiatric medications (Seroquel and hydroxyzine) for several months, and has not been following-up with [facility]. She notes that off her medications she becomes very labile. She will go days without sleeping, increase activity, and feel very irritable. She notes that she will have explosive anger outbursts towards her  mother and brother, and also have several crying spells throughout the day. She appears manic on exam with pressured speech, mood lability, and flight of ideas. She admits to relapsing on heroin and meth. She was previously working at [employer], but has been unemployed for a month. She states she "does whatever it takes" to get the $160 dollars worth of heroin and meth she uses daily. She states she almost always uses her own personal needles, but has shared needles on occasion. She requests to be tested for Hepatitis, HIV, RPR, gonorrhea and chlamydia. She states that she uses drugs sometimes out of boredom, and also at times to self-medicate. She is aware that it causes her to become psychotic and act out aggressively towards family. She is frustrated with herself for relapsing so many times. She previously attended [facility] intensive outpatient program. She feels that she may need inpatient detox treatment at this time.  Continued Clinical Symptoms: Alcohol Use Disorder Identification Test Final Score (AUDIT): 0 The "Alcohol Use Disorders Identification Test", Guidelines for Use in Primary Care, Second Edition. [employer] writer [facility]). Score between 0-7: no or low risk or alcohol related problems. Score between 8-15: moderate risk of alcohol related problems. Score between 16-19: high risk of alcohol related problems. Score 20 or above: warrants further diagnostic evaluation for alcohol dependence and treatment.   CLINICAL FACTORS: Severe Anxiety and/or Agitation Bipolar Disorder:Mixed State Alcohol/Substance Abuse/Dependencies More than one psychiatric diagnosis Currently Psychotic Unstable or Poor Therapeutic Relationship Previous Psychiatric Diagnoses and Treatments   Musculoskeletal: Strength & Muscle Tone:within normal limits Gait & Station:normal Patient leans:N/A  Psychiatric Specialty Exam: Physical Exam Vitalsand nursing notereviewed.   Constitutional:  Appearance: Normal appearance.  HENT:  Head: Normocephalicand atraumatic.  Nose: Nose normal.  Mouth/Throat:  Mouth: Mucous membranes are moist.  Pharynx: Oropharynx is clear.  Eyes:  Extraocular Movements: Extraocular movements intact.  Conjunctiva/sclera: Conjunctivae normal.  Pupils: Pupils  are equal, round, and reactive to light.  Cardiovascular:  Rate and Rhythm: Pulses: Normal pulses.  Pulmonary:  Effort: Pulmonary effort is normal.  Breath sounds: Normal breath sounds.  Abdominal:  General: Abdomen is flat.  Palpations: Abdomen is soft.  Musculoskeletal:  General: No swelling.Normal range of motion.  Skin: General: Skin is warmand dry.  Neurological:  General: No focal deficitpresent.  Mental Status: She is alertand oriented to person, place, and time.  Psychiatric:   Mood is good, affect bright. Logical linear and goal directed Gait and station within normal limits    Review of Systems  Constitutiona  HENT: Negative forrhinorrheaand sore throat.  Eyes: Negative forphotophobiaand visual disturbance.  Respiratory: Negative forcoughand shortness of breath.  Cardiovascular: Negative forchest painand palpitations.  Gastrointestinal: Negative forconstipationand vomiting.  Endocrine: Negative forcold intoleranceand heat intolerance.  Genitourinary: Negative fordifficulty urinatingand dysuria.  Musculoskeletal: Negative forarthralgiasand myalgias.  Skin: Negative forrashand wound.  Allergic/Immunologic: Negative forenvironmental allergiesand food allergies.  Neurological: Negative fordizzinessand headaches.  Hematological: Negative foradenopathy.Does not bruise/bleed easily.  Psychiatric/Behavioral- negative     General Appearance: Good grooming  Eye Contact: Good  Speech: Within normal limits  Volume: Within normal limits  Mood:  Happy  Affect: Full  Thought Process:Disorganized  Orientation:Full (Time, Place, and Person)  Thought Content:Tangential  Suicidal Thoughts: no  Homicidal Thoughts:No  Memory:Good  Judgement:Impaired  Insight:Fair  Psychomotor Activity:Increased  Concentration:Concentration:Poorand Attention Span: Poor  Recall:Fair  Fund of Knowledge:Good  Language:Good  Akathisia:Negative  Handed:Right  AIMS (if indicated):   Assets:Communication Skills Desire for Improvement Resilience Social Support  ADL's:Intact  Cognition:I good  Sleep:Number of Hours: 6.45      COGNITIVE FEATURES THAT CONTRIBUTE TO RISK: Loss of executive function  SUICIDE RISK: Not applicable.  Patient denies have any thoughts of suicide today.  No homicidal thoughts.  PLAN OF CARE: Patient will be discharged to a chemical benzo facility in [facility].   Demographic Factors:  Adolescent or young adult  Loss Factors: NA  Historical Factors: Impulsivity  Risk Reduction Factors:   Positive social support, Positive therapeutic relationship and Positive coping skills or problem solving skills  Continued Clinical Symptoms:  Bipolar Disorder:   Bipolar II  Cognitive Features That Contribute To Risk:  None    Suicide Risk:  Minimal: No identifiable suicidal ideation.  Patients presenting with no risk factors but with morbid ruminations; may be classified as minimal risk based on the severity of the depressive symptoms   Follow-up Information    [facility] Follow up.   Specialty: Addiction Medicine Why: You are on the wailtlist.  Thanks! Contact information: [address] [place] [address] [phone]        [facility] Follow up.   Why: Pt has been reviewed, awaiting bed date. No estimated beddate (12/19/19). Contact information: [address] [place] [address] [phone]        [facility] on 12/23/2019.   Why: Your appointment is on 12/23/2019 at 12:30PM.  Thanks! Contact information: [address] [place] [address] [phone]               Plan Of Care/Follow-up recommendations:  Diet:  regular  Reggie Pile, MD 12/23/2019, 9:54 AM

## 2020-01-03 ENCOUNTER — Ambulatory Visit: Payer: Self-pay | Admitting: Physician Assistant

## 2020-01-04 ENCOUNTER — Ambulatory Visit: Payer: Self-pay | Admitting: Physician Assistant

## 2020-02-01 ENCOUNTER — Ambulatory Visit: Payer: Self-pay | Admitting: Physician Assistant

## 2020-02-01 ENCOUNTER — Encounter: Payer: Self-pay | Admitting: Physician Assistant

## 2020-02-09 ENCOUNTER — Ambulatory Visit: Payer: Self-pay | Admitting: Physician Assistant

## 2020-02-15 ENCOUNTER — Encounter: Payer: Self-pay | Admitting: Physician Assistant

## 2020-02-18 ENCOUNTER — Other Ambulatory Visit: Payer: Self-pay

## 2020-02-18 DIAGNOSIS — U071 COVID-19: Secondary | ICD-10-CM | POA: Insufficient documentation

## 2020-02-18 DIAGNOSIS — E876 Hypokalemia: Secondary | ICD-10-CM | POA: Insufficient documentation

## 2020-02-18 DIAGNOSIS — R4182 Altered mental status, unspecified: Secondary | ICD-10-CM | POA: Insufficient documentation

## 2020-02-18 DIAGNOSIS — F1510 Other stimulant abuse, uncomplicated: Secondary | ICD-10-CM | POA: Insufficient documentation

## 2020-02-18 MED ORDER — ZIPRASIDONE MESYLATE 20 MG IM SOLR
20.0000 mg | Freq: Once | INTRAMUSCULAR | Status: AC
  Administered 2020-02-19: 20 mg via INTRAMUSCULAR
  Filled 2020-02-18: qty 20

## 2020-02-18 NOTE — ED Triage Notes (Signed)
Patient was brought in by pov. Per the person dropping the patient off the patient is homeless and was staying the night with her. The patient told her that she had taken two klonopin. Patient is restless, paranoid and not cooperating at this time. Patient unable to say where she is at. Patient states that she is hearing someone named ashley and looking around the room for her. Dr. Elesa Massed to triage to see patient.

## 2020-02-19 ENCOUNTER — Emergency Department: Payer: Self-pay

## 2020-02-19 ENCOUNTER — Emergency Department
Admission: EM | Admit: 2020-02-19 | Discharge: 2020-02-20 | Disposition: A | Discharge status: Home / Self Care | Payer: Self-pay | Attending: Emergency Medicine | Admitting: Emergency Medicine

## 2020-02-19 DIAGNOSIS — F15951 Other stimulant use, unspecified with stimulant-induced psychotic disorder with hallucinations: Secondary | ICD-10-CM

## 2020-02-19 DIAGNOSIS — F19959 Other psychoactive substance use, unspecified with psychoactive substance-induced psychotic disorder, unspecified: Secondary | ICD-10-CM | POA: Diagnosis present

## 2020-02-19 DIAGNOSIS — F1510 Other stimulant abuse, uncomplicated: Secondary | ICD-10-CM

## 2020-02-19 DIAGNOSIS — F1110 Opioid abuse, uncomplicated: Secondary | ICD-10-CM

## 2020-02-19 DIAGNOSIS — F419 Anxiety disorder, unspecified: Secondary | ICD-10-CM

## 2020-02-19 DIAGNOSIS — F19951 Other psychoactive substance use, unspecified with psychoactive substance-induced psychotic disorder with hallucinations: Secondary | ICD-10-CM

## 2020-02-19 DIAGNOSIS — Z862 Personal history of diseases of the blood and blood-forming organs and certain disorders involving the immune mechanism: Secondary | ICD-10-CM

## 2020-02-19 DIAGNOSIS — F319 Bipolar disorder, unspecified: Secondary | ICD-10-CM

## 2020-02-19 DIAGNOSIS — E876 Hypokalemia: Secondary | ICD-10-CM

## 2020-02-19 DIAGNOSIS — R636 Underweight: Secondary | ICD-10-CM

## 2020-02-19 DIAGNOSIS — R4182 Altered mental status, unspecified: Secondary | ICD-10-CM

## 2020-02-19 DIAGNOSIS — F3164 Bipolar disorder, current episode mixed, severe, with psychotic features: Secondary | ICD-10-CM

## 2020-02-19 DIAGNOSIS — U071 COVID-19: Secondary | ICD-10-CM

## 2020-02-19 DIAGNOSIS — F1910 Other psychoactive substance abuse, uncomplicated: Secondary | ICD-10-CM

## 2020-02-19 DIAGNOSIS — G2581 Restless legs syndrome: Secondary | ICD-10-CM | POA: Diagnosis present

## 2020-02-19 LAB — COMPREHENSIVE METABOLIC PANEL
ALT: 35 U/L (ref 0–44)
AST: 49 U/L — ABNORMAL HIGH (ref 15–41)
Albumin: 4.6 g/dL (ref 3.5–5.0)
Alkaline Phosphatase: 66 U/L (ref 38–126)
Anion gap: 17 — ABNORMAL HIGH (ref 5–15)
BUN: 20 mg/dL (ref 6–20)
CO2: 22 mmol/L (ref 22–32)
Calcium: 9.8 mg/dL (ref 8.9–10.3)
Chloride: 104 mmol/L (ref 98–111)
Creatinine, Ser: 0.87 mg/dL (ref 0.44–1.00)
GFR, Estimated: >60 mL/min (ref >60)
Glucose, Bld: 110 mg/dL — ABNORMAL HIGH (ref 70–99)
Potassium: 2.7 mmol/L — CL (ref 3.5–5.1)
Sodium: 143 mmol/L (ref 135–145)
Total Bilirubin: 1.2 mg/dL (ref 0.3–1.2)
Total Protein: 8 g/dL (ref 6.5–8.1)

## 2020-02-19 LAB — CBC WITH DIFFERENTIAL/PLATELET
Abs Immature Granulocytes: 0.02 10*3/uL (ref 0.00–0.07)
Basophils Absolute: 0 10*3/uL (ref 0.0–0.1)
Basophils Relative: 0 %
Eosinophils Absolute: 0.2 10*3/uL (ref 0.0–0.5)
Eosinophils Relative: 2 %
HCT: 36.4 % (ref 36.0–46.0)
Hemoglobin: 13 g/dL (ref 12.0–15.0)
Immature Granulocytes: 0 %
Lymphocytes Relative: 23 %
Lymphs Abs: 1.9 10*3/uL (ref 0.7–4.0)
MCH: 30.8 pg (ref 26.0–34.0)
MCHC: 35.7 g/dL (ref 30.0–36.0)
MCV: 86.3 fL (ref 80.0–100.0)
Monocytes Absolute: 0.9 10*3/uL (ref 0.1–1.0)
Monocytes Relative: 11 %
Neutro Abs: 5.3 10*3/uL (ref 1.7–7.7)
Neutrophils Relative %: 64 %
Platelets: 243 10*3/uL (ref 150–400)
RBC: 4.22 MIL/uL (ref 3.87–5.11)
RDW: 12.9 % (ref 11.5–15.5)
WBC: 8.2 10*3/uL (ref 4.0–10.5)
nRBC: 0 % (ref 0.0–0.2)

## 2020-02-19 LAB — RESP PANEL BY RT-PCR (FLU A&B, COVID) ARPGX2
Influenza A by PCR: NEGATIVE
Influenza B by PCR: NEGATIVE
SARS Coronavirus 2 by RT PCR: POSITIVE — AB

## 2020-02-19 LAB — URINE DRUG SCREEN, QUALITATIVE (ARMC ONLY)
Amphetamines, Ur Screen: POSITIVE — AB
Barbiturates, Ur Screen: NOT DETECTED
Benzodiazepine, Ur Scrn: POSITIVE — AB
Cannabinoid 50 Ng, Ur ~~LOC~~: POSITIVE — AB
Cocaine Metabolite,Ur ~~LOC~~: NOT DETECTED
MDMA (Ecstasy)Ur Screen: NOT DETECTED
Methadone Scn, Ur: NOT DETECTED
Opiate, Ur Screen: NOT DETECTED
Phencyclidine (PCP) Ur S: NOT DETECTED
Tricyclic, Ur Screen: POSITIVE — AB

## 2020-02-19 LAB — PREGNANCY, URINE: Preg Test, Ur: NEGATIVE

## 2020-02-19 LAB — ETHANOL: Alcohol, Ethyl (B): <10 mg/dL (ref <10)

## 2020-02-19 LAB — SALICYLATE LEVEL: Salicylate Lvl: <7 mg/dL — ABNORMAL LOW (ref 7.0–30.0)

## 2020-02-19 LAB — TROPONIN I (HIGH SENSITIVITY): Troponin I (High Sensitivity): 4 ng/L (ref <18)

## 2020-02-19 LAB — MAGNESIUM: Magnesium: 2.1 mg/dL (ref 1.7–2.4)

## 2020-02-19 LAB — ACETAMINOPHEN LEVEL: Acetaminophen (Tylenol), Serum: <10 ug/mL — ABNORMAL LOW (ref 10–30)

## 2020-02-19 MED ORDER — LORAZEPAM 2 MG PO TABS: 2.0000 mg | ORAL_TABLET | Freq: Three times a day (TID) | ORAL | Status: DC | PRN

## 2020-02-19 MED ORDER — ROPINIROLE HCL 0.25 MG PO TABS
0.2500 mg | ORAL_TABLET | Freq: Every day | ORAL | Status: DC
  Filled 2020-02-19: qty 1

## 2020-02-19 MED ORDER — TRAZODONE HCL 50 MG PO TABS
50.0000 mg | ORAL_TABLET | Freq: Every day | ORAL | Status: DC
Start: 2020-02-19 — End: 2020-02-20
  Administered 2020-02-19: 50 mg via ORAL
  Filled 2020-02-19: qty 1

## 2020-02-19 MED ORDER — POTASSIUM CHLORIDE CRYS ER 20 MEQ PO TBCR
40.0000 meq | EXTENDED_RELEASE_TABLET | Freq: Every day | ORAL | Status: DC
  Administered 2020-02-19 – 2020-02-20 (×2): 40 meq via ORAL
  Filled 2020-02-19 (×2): qty 2

## 2020-02-19 MED ORDER — HALOPERIDOL 5 MG PO TABS: 10.0000 mg | ORAL_TABLET | Freq: Three times a day (TID) | ORAL | Status: DC | PRN

## 2020-02-19 MED ORDER — POTASSIUM CHLORIDE 10 MEQ/100ML IV SOLN
10.0000 meq | INTRAVENOUS | Status: AC
  Administered 2020-02-19 (×4): 10 meq via INTRAVENOUS
  Filled 2020-02-19 (×4): qty 100

## 2020-02-19 MED ORDER — TAB-A-VITE/IRON PO TABS
1.0000 | ORAL_TABLET | Freq: Every day | ORAL | Status: DC
  Administered 2020-02-20: 1 via ORAL
  Filled 2020-02-19 (×2): qty 1

## 2020-02-19 MED ORDER — LORAZEPAM 2 MG/ML IJ SOLN
2.0000 mg | Freq: Three times a day (TID) | INTRAMUSCULAR | Status: DC | PRN
  Administered 2020-02-19: 2 mg via INTRAMUSCULAR
  Filled 2020-02-19: qty 1

## 2020-02-19 MED ORDER — QUETIAPINE FUMARATE 25 MG PO TABS
100.0000 mg | ORAL_TABLET | Freq: Every morning | ORAL | Status: DC
Start: 2020-02-19 — End: 2020-02-20
  Administered 2020-02-19 – 2020-02-20 (×2): 100 mg via ORAL
  Filled 2020-02-19 (×2): qty 4

## 2020-02-19 MED ORDER — HALOPERIDOL LACTATE 5 MG/ML IJ SOLN: 5.0000 mg | Freq: Three times a day (TID) | INTRAMUSCULAR | Status: DC | PRN

## 2020-02-19 MED ORDER — HALOPERIDOL LACTATE 5 MG/ML IJ SOLN
INTRAMUSCULAR | Status: AC
  Administered 2020-02-19: 10 mg via INTRAMUSCULAR
  Filled 2020-02-19: qty 2

## 2020-02-19 MED ORDER — CITALOPRAM HYDROBROMIDE 20 MG PO TABS
20.0000 mg | ORAL_TABLET | Freq: Every day | ORAL | Status: DC
  Administered 2020-02-19 – 2020-02-20 (×2): 20 mg via ORAL
  Filled 2020-02-19 (×2): qty 1

## 2020-02-19 MED ORDER — DIPHENHYDRAMINE HCL 25 MG PO CAPS: 50.0000 mg | ORAL_CAPSULE | Freq: Three times a day (TID) | ORAL | Status: DC | PRN

## 2020-02-19 MED ORDER — HALOPERIDOL LACTATE 5 MG/ML IJ SOLN: 10.0000 mg | Freq: Three times a day (TID) | INTRAMUSCULAR | Status: DC | PRN

## 2020-02-19 MED ORDER — POTASSIUM CHLORIDE CRYS ER 20 MEQ PO TBCR
40.0000 meq | EXTENDED_RELEASE_TABLET | Freq: Once | ORAL | Status: AC
  Administered 2020-02-19: 40 meq via ORAL
  Filled 2020-02-19: qty 2

## 2020-02-19 MED ORDER — HYDROXYZINE HCL 25 MG PO TABS
25.0000 mg | ORAL_TABLET | Freq: Three times a day (TID) | ORAL | Status: DC | PRN
  Administered 2020-02-19: 25 mg via ORAL
  Filled 2020-02-19: qty 1

## 2020-02-19 MED ORDER — QUETIAPINE FUMARATE 200 MG PO TABS
200.0000 mg | ORAL_TABLET | Freq: Every day | ORAL | Status: DC
  Administered 2020-02-19: 200 mg via ORAL
  Filled 2020-02-19: qty 1

## 2020-02-19 MED ORDER — HALOPERIDOL 5 MG PO TABS: 5.0000 mg | ORAL_TABLET | Freq: Three times a day (TID) | ORAL | Status: DC | PRN

## 2020-02-19 MED ORDER — DIPHENHYDRAMINE HCL 50 MG/ML IJ SOLN
50.0000 mg | Freq: Four times a day (QID) | INTRAMUSCULAR | Status: DC | PRN
  Administered 2020-02-19: 50 mg via INTRAMUSCULAR
  Filled 2020-02-19: qty 1

## 2020-02-19 NOTE — ED Provider Notes (Signed)
[facility] Emergency Department Provider Note  ____________________________________________   Event Date/Time   First MD Initiated Contact with Patient 02/18/20 2349     (approximate)  I have reviewed the triage vital signs and the nursing notes.   HISTORY  Chief Complaint Altered Mental Status    HPI Jill Shepherd is a 25 y.o. female with history of Polar disorder, substance abuse who presents to the emergency department after she was dropped off by someone.  There is no family or friend present at bedside.  Patient is altered, appears paranoid and is very agitated.  Unable to be redirected.        Past Medical History:  Diagnosis Date  . Drug abuse (HCC)   . Headache   . SAB (spontaneous abortion) 01/19/2014   stillborn at 20.4 wks    Patient Active Problem List   Diagnosis Date Noted  . Bipolar 1 disorder, depressed (HCC) 12/07/2019  . 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 Bipolar 1 disorder (HCC) 05/13/2019  . Opiate abuse, continuous (HCC) 05/07/2019  . 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 Sepsis (HCC) 05/06/2019  . Abscess-multpile sites 05/06/2019  . Anxiety 08/04/2017  . Postpartum depression 08/04/2017  . SVD (spontaneous vaginal delivery) 07/17/2017  . History of preterm delivery 11/28/2016  . Pregnancy complicated by subutex maintenance, antepartum (HCC) 11/28/2016  . History of preterm premature rupture of membranes (PPROM) 09/25/2015  . History of pregnancy loss in prior pregnancy, currently pregnant 05/14/2015  . History of drug use 05/14/2015  . Underweight 05/14/2015    Past Surgical History:  Procedure Laterality Date  . INCISION AND DRAINAGE ABSCESS Left 05/07/2019   Procedure: INCISION AND DRAINAGE ABSCESS;  Surgeon: Leafy Ro, MD;  Location: [facility] ORS;  Service: General;  Laterality: Left;    Prior to Admission medications   Medication Sig Start Date  End Date Taking? Authorizing Provider  QUEtiapine (SEROQUEL) 200 MG tablet Take 1 tablet (200 mg total) by mouth at bedtime. 12/23/19   Reggie Pile, MD  rOPINIRole (REQUIP) 0.25 MG tablet Take 1 tablet (0.25 mg total) by mouth at bedtime. 12/23/19   Reggie Pile, MD    Allergies Patient has no known allergies.  Family History  Problem Relation Age of Onset  . Hypertension Father   . Hypertension Paternal Grandfather     Social History Social History   Tobacco Use  . Smoking status: Never Smoker  . Smokeless tobacco: Never Used  Vaping Use  . Vaping Use: Never used  Substance Use Topics  . Alcohol use: No    Alcohol/week: 0.0 standard drinks  . Drug use: Yes    Types: Other-see comments, Amphetamines, Fentanyl, Heroin    Comment: subutex tx, snorted heroin last on 5/8    Review of Systems For 5 caveat for altered mental status  ____________________________________________   PHYSICAL EXAM:  VITAL SIGNS:  Vitals:   02/18/20 2350 02/19/20 0029 02/19/20 0333 02/19/20 0500  BP:  (!) 107/57 119/67 91/61  Pulse:  92 60 73  Resp:  16 (!) 21 20  Temp:  98.1 F (36.7 C)    TempSrc:  Oral    SpO2:  98% 100% 100%  Weight: 47.6 kg     Height: 5\' 1"  (1.549 m)      CONSTITUTIONAL: Alert but does not answer questions or follow commands.  Chronically ill-appearing. HEAD: Normocephalic appears atraumatic EYES: Conjunctivae clear, pupils appear equal, EOM appear intact ENT: normal nose; moist mucous  membranes NECK: Supple, normal ROM CARD: RRR; S1 and S2 appreciated; no murmurs, no clicks, no rubs, no gallops RESP: Normal chest excursion without splinting or tachypnea; breath sounds clear and equal bilaterally; no wheezes, no rhonchi, no rales, no hypoxia or respiratory distress, speaking full sentences ABD/GI: Normal bowel sounds; non-distended; soft, non-tender, no rebound, no guarding, no peritoneal signs, no hepatosplenomegaly BACK: The back appears normal EXT: Normal ROM  in all joints; no deformity noted, no edema; no cyanosis SKIN: Normal color for age and race; warm; no rash on exposed skin, multiple skin lesions noted to the face consistent with picking without signs of superimposed infection NEURO: Moves all extremities equally, speech appears clear but nonsensical PSYCH: Appears paranoid, fidgety.  Pacing around the triage area.  Becomes combative, screaming with security.  Unable to answer questions appropriately.  ____________________________________________   LABS (all labs ordered are listed, but only abnormal results are displayed)  Labs Reviewed  RESP PANEL BY RT-PCR (FLU A&B, COVID) ARPGX2 - Abnormal; Notable for the following components:      Result Value   SARS Coronavirus 2 by RT PCR POSITIVE (*)    All other components within normal limits  COMPREHENSIVE METABOLIC PANEL - Abnormal; Notable for the following components:   Potassium 2.7 (*)    Glucose, Bld 110 (*)    AST 49 (*)    Anion gap 17 (*)    All other components within normal limits  URINE DRUG SCREEN, QUALITATIVE ([facility] ONLY) - Abnormal; Notable for the following components:   Tricyclic, Ur Screen POSITIVE (*)    Amphetamines, Ur Screen POSITIVE (*)    Cannabinoid 50 Ng, Ur [place] POSITIVE (*)    Benzodiazepine, Ur Scrn POSITIVE (*)    All other components within normal limits  SALICYLATE LEVEL - Abnormal; Notable for the following components:   Salicylate Lvl <7.0 (*)    All other components within normal limits  ACETAMINOPHEN LEVEL - Abnormal; Notable for the following components:   Acetaminophen (Tylenol), Serum <10 (*)    All other components within normal limits  ETHANOL  CBC WITH DIFFERENTIAL/PLATELET  PREGNANCY, URINE  MAGNESIUM  CBG MONITORING, ED  TROPONIN I (HIGH SENSITIVITY)   ____________________________________________  EKG   EKG Interpretation  Date/Time:  Sunday February 19 2020 04:57:53 EST Ventricular Rate:  70 PR Interval:  114 QRS Duration: 97 QT  Interval:  412 QTC Calculation: 445 R Axis:   66 Text Interpretation: Sinus arrhythmia Borderline short PR interval Borderline Q waves in lateral leads Baseline wander in lead(s) V2 V3 V4 ST and T wave changes in inferior and lateral leads resolved Confirmed by Rochele Raring [phone]) on 02/19/2020 5:12:52 AM       EKG Interpretation  Date/Time:  Sunday February 19 2020 04:57:53 EST Ventricular Rate:  70 PR Interval:  114 QRS Duration: 97 QT Interval:  412 QTC Calculation: 445 R Axis:   66 Text Interpretation: Sinus arrhythmia Borderline short PR interval Borderline Q waves in lateral leads Baseline wander in lead(s) V2 V3 V4 ST and T wave changes in inferior and lateral leads resolved Confirmed by Rochele Raring [phone]) on 02/19/2020 5:12:52 AM       ____________________________________________  RADIOLOGY Normajean Baxter Ward, personally viewed and evaluated these images (plain radiographs) as part of my medical decision making, as well as reviewing the written report by the radiologist.  ED MD interpretation: Head CT shows no acute abnormality.  Official radiology report(s): CT Head Wo Contrast  Result Date: 02/19/2020 CLINICAL DATA:  Mental status changes.  Unknown cause. EXAM: CT HEAD WITHOUT CONTRAST TECHNIQUE: Contiguous axial images were obtained from the base of the skull through the vertex without intravenous contrast. COMPARISON:  03/27/2019 FINDINGS: Brain: No evidence of acute infarction, hemorrhage, hydrocephalus, extra-axial collection or mass lesion/mass effect. Vascular: No hyperdense vessel or unexpected calcification. Skull: Normal. Negative for fracture or focal lesion. Sinuses/Orbits: Retention cyst in the sphenoid sinus. Paranasal sinuses and mastoid air cells are otherwise clear. Other: None. IMPRESSION: 1. No acute intracranial abnormalities. 2. Retention cyst in the sphenoid sinus. Electronically Signed   By: Burman NievesWilliam  Stevens M.D.   On: 02/19/2020 01:37     ____________________________________________   PROCEDURES  Procedure(s) performed (including Critical Care):  Procedures  CRITICAL CARE Performed by: Baxter HireKristen Ward   Total critical care time: 65 minutes  Critical care time was exclusive of separately billable procedures and treating other patients.  Critical care was necessary to treat or prevent imminent or life-threatening deterioration.  Critical care was time spent personally by me on the following activities: development of treatment plan with patient and/or surrogate as well as nursing, discussions with consultants, evaluation of patient's response to treatment, examination of patient, obtaining history from patient or surrogate, ordering and performing treatments and interventions, ordering and review of laboratory studies, ordering and review of radiographic studies, pulse oximetry and re-evaluation of patient's condition.  ____________________________________________   INITIAL IMPRESSION / ASSESSMENT AND PLAN / ED COURSE  As part of my medical decision making, I reviewed the following data within the electronic MEDICAL RECORD NUMBER Nursing notes reviewed and incorporated, Labs reviewed and showed hypokalemia and drug screen positive for amphetamines, benzodiazepines, cannabinoids and tricyclics, EKG interpreted nonspecific ST-T wave changes, Old chart reviewed and Notes from prior ED visits         Patient here with altered mental status.  History of bipolar disorder also substance abuse.  Suspect psychosis related to bipolar disorder versus substance related.  Given she appears altered, agitated, paranoid, and I suspect she is a danger to herself and others in this condition, have placed her under full IVC.  Will give IM Geodon.  Once more calm will obtain labs, urine, head CT.  Once medically cleared, patient will likely need TTS evaluation.     1:45 AM  Pt is incidentally COVID-positive.  There is a possibility this  could be COVID encephalopathy but given her history I suspect it is more likely due to her underlying psychiatric illness and drug abuse.  Her drug screen is positive for benzodiazepines, amphetamines, tricyclics and cannabinoids.  Alcohol level is normal.  Tylenol and salicylate negative.  She is resting comfortably after Geodon.  She does have potassium level today of 2.7 but no prolonged QT intervals.  Will give IV replacement until she is able to take oral replacement.  We will also check magnesium level.  She does have some ST depression in inferior lateral leads that appears slightly worse compared to March 2021.  We will add on troponin level.   4:00 AM  Pt still resting comfortably.  Her magnesium level is normal.  Troponin is normal.  Patient medically cleared and awaiting psychiatric evaluation.  5:10 AM  Pt awake and alert without complaints.  States she did have a slightly sore throat.  No chest pain, shortness of breath, cough.  Afebrile. Repeat EKG shows ischemic changes in inferior lateral leads have resolved.  Patient medically clear and awaiting psychiatric evaluation for further disposition.  I reviewed all nursing notes and pertinent  previous records as available.  I have reviewed and interpreted any EKGs, lab and urine results, imaging (as available).   FINAL CLINICAL IMPRESSION(S) / ED DIAGNOSES  Final diagnoses:  Altered mental status, unspecified altered mental status type  COVID-19  Substance abuse (HCC)  Hypokalemia     ED Discharge Orders    None      *Please note:  GAYLON MELCHOR was evaluated in Emergency Department on 02/19/2020 for the symptoms described in the history of present illness. She was evaluated in the context of the global COVID-19 pandemic, which necessitated consideration that the patient might be at risk for infection with the SARS-CoV-2 virus that causes COVID-19. Institutional protocols and algorithms that pertain to the evaluation of patients  at risk for COVID-19 are in a state of rapid change based on information released by regulatory bodies including the CDC and federal and state organizations. These policies and algorithms were followed during the patient's care in the ED.  Some ED evaluations and interventions may be delayed as a result of limited staffing during and the pandemic.*   Note:  This document was prepared using Dragon voice recognition software and may include unintentional dictation errors.   Ward, Layla Maw, DO 02/19/20 [phone]

## 2020-02-19 NOTE — ED Notes (Signed)
Repeat EKG completed now

## 2020-02-19 NOTE — ED Notes (Signed)
Hourly rounding completed at this time, patient currently asleep in room. No complaints, stable, and in no acute distress. Q15 minute rounds and monitoring via Rover and Officer to continue. 

## 2020-02-19 NOTE — ED Notes (Signed)
Pt continues to try to come out of room and leave. Manic, not making sense, unable to grasp why she's here. Explained covid status and IVC and pt remains upset and confused. Yelling and cussing at staff. Taking clothes off in room. Multiple attempts of redirection made.

## 2020-02-19 NOTE — ED Notes (Signed)
Hourly rounding completed at this time, patient currently awake in room. No complaints, stable, and in no acute distress. Q15 minute rounds and monitoring via Rover and Officer to continue. °

## 2020-02-19 NOTE — ED Notes (Signed)
Pt has been sleeping. Unable to get vitals.

## 2020-02-19 NOTE — Care Management (Addendum)
°  Writer referred patient to the following facilities:  [facility] Details Fax  [address], [place] [address]  Internal comment [facility] Details Fax  [address] [place] [address]  Internal comment [facility] Details Fax  [address] [place] [address]  Internal comment [facility] Details Fax  [address], [place] [address]  Internal comment [facility] Details Fax  [address], [place] [address]  Internal comment [facility] Details Fax  [address], [place] [address]  Internal comment [facility] Details [address] [facility] [address] [place] [address]  Internal comment [facility] Details Fax  [address], [place] [address]  Internal comment [facility] Details Fax  [address] [place], [place] [address]  Internal comment [facility] Details Fax  [address] [place] [address]  Internal comment [facility] Details Fax  [phone] [address], [place] [address]  Internal comment [facility] Details Fax  [address] [place] [address]  Internal comment [facility] Details Fax  [address] [place] [address]  Internal comment [facility] Details Fax  [address] [place] [address]  Internal comment [facility] Details Fax  [name] [place] [address]  Internal comment [facility] Details Fax  [address], [place] [address]  Internal comment [facility] Details Fax  [address], [place] [address]  Internal comment [facility] Details Fax  [address] [place], [place] [address]  Internal comment [facility] Details Fax  [address] [place] [address]  Internal comment

## 2020-02-19 NOTE — ED Notes (Signed)
Pt unable to follow commands enough to safely receive PO K, will admin once pt can take PO.

## 2020-02-19 NOTE — ED Notes (Signed)
Pt not answering any questions for assessment, consistent with condition on arrival. Pt is able to state where she is but nothing else. Pt states to this nurse, "you aren't blake" at one point but then does not speak anything else that is consistent with what was asked. PT does follow commands after arriving to room and is cooperative.

## 2020-02-19 NOTE — ED Notes (Signed)
IVC/pending psych consult 

## 2020-02-19 NOTE — BH Assessment (Signed)
Per Patient's nurse patient is currently sleeping and currently under the influence of substances, patient has had difficulty answering questions upon arrival. Psychiatry team to follow-up with patient when she is more coherent
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
## 2020-02-19 NOTE — ED Notes (Signed)
Pt up and requesting to go to bathroom. PT assisted to restroom by this nurse, pt provides urine sample. Pt dressed into hospital provided blue scrubs at this time, monitored by this nurse and female officer in quad area. Pt able to change on own without assistance. Belongings placed in bag and clearly labeled. Items include  Black purse Blue and black bag 1 navy/burgandy boots White socks Jill Shepherd bibs Black underwear Black bra Scientist, research (life sciences) Hair bow x 2 Leather necklace Silver necklace Nose ring Gold bracelet Silver ring Black ring

## 2020-02-19 NOTE — ED Notes (Signed)
Garage door opened in room, pt placed on cardiac leads and monitor.

## 2020-02-19 NOTE — ED Notes (Signed)
Patient transferred from Triage to room 23. Report received from [place], [place] including situation, background, assessment and recommendations. Pt required to be assisted an carried by security due to pt condition and unsteadiness. Pt is disoriented and on unknown substances.

## 2020-02-19 NOTE — BH Assessment (Signed)
Comprehensive Clinical Assessment (CCA) Note  02/19/2020 Jill Shepherd [id number]  Chief Complaint: Patient is a 25 year old female presenting to [facility] ED under IVC. Per triage note Patient was brought in by pov. Per the person dropping the patient off the patient is homeless and was staying the night with her. The patient told her that she had taken two klonopin. Patient is restless, paranoid and not cooperating at this time. Patient unable to say where she is at. Patient states that she is hearing someone named ashley and looking around the room for her. Dr. Elesa Massed to triage to see patient. During assessment patient appears much more alert and oriented x4, calm and cooperative, mood is pleasant. When asked if patient remembers what happened today she reports that she is unable to remember everything. Patient was able to report that she was staying with a friend and "I smoked some Meth." Patient unable to report the amounts that she smoked today. Patient is unaware if she took any other substances last night. Patient UDS is positive for Amphetamines, Cannabinoids, Benzos and Tricyclics.  Patient reports that she was just recently in substance abuse treatment 1 week ago with [facility] but reports that after she completed treatment she got involved with the wrong people and relapsed. Patient denies any issues with her mental health, she denies SI/HI/AH/VH and does not appear to be responding to any internal or external stimuli.  Patient disposition pending Chief Complaint  Patient presents with  . Altered Mental Status  . Addiction Problem   Visit Diagnosis: Methamphetamine Abuse, Bipolar by hx   CCA Screening, Triage and Referral (STR)  Patient Reported Information How did you hear about [place]? Other (Comment)  Referral name: No data recorded Referral phone number: No data recorded  Whom do you see for routine medical problems? Other (Comment)  Practice/Facility Name: No data  recorded Practice/Facility Phone Number: No data recorded Name of Contact: No data recorded Contact Number: No data recorded Contact Fax Number: No data recorded Prescriber Name: No data recorded Prescriber Address (if known): No data recorded  What Is the Reason for Your Visit/Call Today? No data recorded How Long Has This Been Causing You Problems? > than 6 months  What Do You Feel Would Help You the Most Today? Assessment Only; Medication; Therapy   Have You Recently Been in Any Inpatient Treatment (Hospital/Detox/Crisis Center/28-Day Program)? Yes  Name/Location of Program/Hospital:[facility]  How Long Were You There? Unknown  When Were You Discharged? No data recorded  Have You Ever Received Services From [facility] Before? Yes  Who Do You See at [facility]? Inpatient treatment   Have You Recently Had Any Thoughts About Hurting Yourself? No  Are You Planning to Commit Suicide/Harm Yourself At This time? No   Have you Recently Had Thoughts About Hurting Someone [name]? No  Explanation: No data recorded  Have You Used Any Alcohol or Drugs in the Past 24 Hours? Yes  How Long Ago Did You Use Drugs or Alcohol? 0520  What Did You Use and How Much? Methamphetamine   Do You Currently Have a Therapist/Psychiatrist? No  Name of Therapist/Psychiatrist: No data recorded  Have You Been Recently Discharged From Any Office Practice or Programs? No  Explanation of Discharge From Practice/Program: No data recorded    CCA Screening Triage Referral Assessment Type of Contact: Face-to-Face  Is this Initial or Reassessment? No data recorded Date Telepsych consult ordered in [facility]:  No data recorded Time Telepsych consult ordered in [facility]:  No data recorded  Patient Reported Information Reviewed? Yes  Patient Left Without Being Seen? No data recorded Reason for Not Completing Assessment: No data recorded  Collateral Involvement: No data recorded  Does Patient Have a Court  Appointed Legal Guardian? No data recorded Name and Contact of Legal Guardian: Self  If Minor and Not Living with Parent(s), Who has Custody? No data recorded Is CPS involved or ever been involved? Never  Is APS involved or ever been involved? Never   Patient Determined To Be At Risk for Harm To Self or Others Based on Review of Patient Reported Information or Presenting Complaint? No  Method: No data recorded Availability of Means: No data recorded Intent: No data recorded Notification Required: No data recorded Additional Information for Danger to Others Potential: No data recorded Additional Comments for Danger to Others Potential: No data recorded Are There Guns or Other Weapons in Your Home? No  Types of Guns/Weapons: No data recorded Are These Weapons Safely Secured?                            No data recorded Who Could Verify You Are Able To Have These Secured: No data recorded Do You Have any Outstanding Charges, Pending Court Dates, Parole/Probation? No data recorded Contacted To Inform of Risk of Harm To Self or Others: No data recorded  [facility]   Does Patient Present under Involuntary Commitment? Yes  IVC Papers Initial File Date: 02/18/2020   [place]   Patient Currently Receiving the Following Services: No data recorded  Determination of Need: Emergent (2 hours)   Options For Referral: No data recorded    CCA Biopsychosocial Intake/Chief Complaint:  Patient presents appearing paranoid  Current Symptoms/Problems: Patient presents appearing paranoid   Patient Reported Schizophrenia/Schizoaffective Diagnosis in Past: No   Strengths: Unknown  Preferences: Unknown  Abilities: Unknown   Type of Services Patient Feels are Needed: Unknown   Initial Clinical Notes/Concerns: None   Mental Health Symptoms Depression:  None   Duration of Depressive symptoms: No data recorded  Mania:  None   Anxiety:    None   Psychosis:  None   Duration of Psychotic symptoms: No data recorded  Trauma:  None   Obsessions:  None   Compulsions:  None   Inattention:  None   Hyperactivity/Impulsivity:  N/A   Oppositional/Defiant Behaviors:  None   Emotional Irregularity:  None   Other Mood/Personality Symptoms:  No data recorded   Mental Status Exam Appearance and self-care  Stature:  Average   Weight:  Average weight   Clothing:  Disheveled   Grooming:  Neglected   Cosmetic use:  None   Posture/gait:  Normal   Motor activity:  Not Remarkable   Sensorium  Attention:  Normal   Concentration:  Normal   Orientation:  X5   Recall/memory:  Normal   Affect and Mood  Affect:  Appropriate   Mood:  Depressed   Relating  Eye contact:  Normal   Facial expression:  Responsive   Attitude toward examiner:  Cooperative   Thought and Language  Speech flow: Normal   Thought content:  Appropriate to Mood and Circumstances   Preoccupation:  None   Hallucinations:  None   Organization:  No data recorded  [employer] ServicesExecutive Functions  Fund of Knowledge:  Average   Intelligence:  Average   Abstraction:  Concrete   Judgement:  Fair  Reality Testing:  Realistic   Insight:  Fair   Decision Making:  Impulsive   Social Functioning  Social Maturity:  Irresponsible   Social Judgement:  Naive   Stress  Stressors:  Housing; Office manager Ability:  Deficient supports   Skill Deficits:  None   Supports:  Support needed     Religion: Religion/Spirituality Are You A Religious Person?: No  Leisure/Recreation: Leisure / Recreation Do You Have Hobbies?: No  Exercise/Diet: Exercise/Diet Do You Exercise?: No Have You Gained or Lost A Significant Amount of Weight in the Past Six Months?: No Do You Follow a Special Diet?: No Do You Have Any Trouble Sleeping?: No   CCA Employment/Education Employment/Work Situation: Employment / Work Situation 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 by current illness:  (No employment currently) What is the longest time patient has a held a job?: Unknown Where was the patient employed at that time?: Unknown Has patient ever been in the [employer]?: No  Education: Education Is Patient Currently Attending School?: No Name of High School: Unknown Did Garment/textile technologist From [employer]?:  (Unknown) Did Theme park manager?:  (Unknown)   CCA Family/Childhood History Family and Relationship History: Family history Marital status: Single Are you sexually active?:  (Unknown) What is your sexual orientation?: Heterosexual Has your sexual activity been affected by drugs, alcohol, medication, or emotional stress?: Unknown Does patient have children?:  (Unknown)  Childhood History:  Childhood History By whom was/is the patient raised?: Other (Comment) (Unknown) Additional childhood history information: Unknown Description of patient's relationship with caregiver when they were a child: Unknown Patient's description of current relationship with people who raised him/her: Unknown How were you disciplined when you got in trouble as a child/adolescent?: Unkown Does patient have siblings?:  (Unknown) Did patient suffer any verbal/emotional/physical/sexual abuse as a child?: No Did patient suffer from severe childhood neglect?: No Has patient ever been sexually abused/assaulted/raped as an adolescent or adult?: No Was the patient ever a victim of a crime or a disaster?: No Witnessed domestic violence?: No Has patient been affected by domestic violence as an adult?: No  Child/Adolescent Assessment:     CCA Substance Use Alcohol/Drug Use: Alcohol / Drug Use Pain Medications: See MAR Prescriptions: See MAR Over the Counter: See MAR History of alcohol / drug use?: Yes Substance #1 Name of Substance 1: Methamphetamine Substance #2 Name of Substance 2: Marijuana                      ASAM's:  Six Dimensions of Multidimensional Assessment  Dimension 1:  Acute Intoxication and/or Withdrawal Potential:      Dimension 2:  Biomedical Conditions and Complications:      Dimension 3:  Emotional, Behavioral, or Cognitive Conditions and Complications:     Dimension 4:  Readiness to Change:     Dimension 5:  Relapse, Continued use, or Continued Problem Potential:     Dimension 6:  Recovery/Living Environment:     ASAM Severity Score:    ASAM Recommended Level of Treatment:     Substance use Disorder (SUD)    Recommendations for Services/Supports/Treatments:  Patient disposition pending  DSM5 Diagnoses: Patient Active Problem List   Diagnosis Date Noted  . Bipolar 1 disorder, depressed (HCC) 12/07/2019  . 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 Bipolar 1 disorder (HCC) 05/13/2019  . Opiate abuse, continuous (HCC) 05/07/2019  . 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 Sepsis (HCC) 05/06/2019  .  Abscess-multpile sites 05/06/2019  . Anxiety 08/04/2017  . Postpartum depression 08/04/2017  . SVD (spontaneous vaginal delivery) 07/17/2017  . History of preterm delivery 11/28/2016  . Pregnancy complicated by subutex maintenance, antepartum (HCC) 11/28/2016  . History of preterm premature rupture of membranes (PPROM) 09/25/2015  . History of pregnancy loss in prior pregnancy, currently pregnant 05/14/2015  . History of drug use 05/14/2015  . Underweight 05/14/2015    Patient Centered Plan: Patient is on the following Treatment Plan(s):  Substance Abuse   Referrals to Alternative Service(s): Referred to Alternative Service(s):   Place:   Date:   Time:    Referred to Alternative Service(s):   Place:   Date:   Time:    Referred to Alternative Service(s):   Place:   Date:   Time:    Referred to Alternative Service(s):   Place:   Date:   Time:     Dailyn Kempner A Landrey Mahurin, LCAS-A

## 2020-02-19 NOTE — ED Notes (Signed)
Pt remains with IV in place and cardiac monitoring on pt. Pt resting in NAD at this time.

## 2020-02-19 NOTE — ED Notes (Signed)
Pt was given dinner tray. I removed trash out of the room

## 2020-02-20 ENCOUNTER — Emergency Department
Admission: EM | Admit: 2020-02-20 | Discharge: 2020-02-24 | Disposition: A | Discharge status: Home / Self Care | Payer: Self-pay | Attending: Emergency Medicine | Admitting: Emergency Medicine

## 2020-02-20 ENCOUNTER — Telehealth: Payer: Self-pay | Admitting: *Deleted

## 2020-02-20 ENCOUNTER — Other Ambulatory Visit: Payer: Self-pay

## 2020-02-20 DIAGNOSIS — U071 COVID-19: Secondary | ICD-10-CM

## 2020-02-20 DIAGNOSIS — Z5900 Homelessness unspecified: Secondary | ICD-10-CM

## 2020-02-20 DIAGNOSIS — F15951 Other stimulant use, unspecified with stimulant-induced psychotic disorder with hallucinations: Secondary | ICD-10-CM

## 2020-02-20 DIAGNOSIS — F19251 Other psychoactive substance dependence with psychoactive substance-induced psychotic disorder with hallucinations: Secondary | ICD-10-CM | POA: Insufficient documentation

## 2020-02-20 DIAGNOSIS — F19959 Other psychoactive substance use, unspecified with psychoactive substance-induced psychotic disorder, unspecified: Secondary | ICD-10-CM | POA: Diagnosis present

## 2020-02-20 LAB — URINE DRUG SCREEN, QUALITATIVE (ARMC ONLY)
Amphetamines, Ur Screen: POSITIVE — AB
Barbiturates, Ur Screen: NOT DETECTED
Benzodiazepine, Ur Scrn: POSITIVE — AB
Cannabinoid 50 Ng, Ur ~~LOC~~: NOT DETECTED
Cocaine Metabolite,Ur ~~LOC~~: NOT DETECTED
MDMA (Ecstasy)Ur Screen: NOT DETECTED
Methadone Scn, Ur: NOT DETECTED
Opiate, Ur Screen: NOT DETECTED
Phencyclidine (PCP) Ur S: NOT DETECTED
Tricyclic, Ur Screen: POSITIVE — AB

## 2020-02-20 MED ORDER — QUETIAPINE FUMARATE 200 MG PO TABS: 200.0000 mg | ORAL_TABLET | Freq: Every day | ORAL | Status: DC

## 2020-02-20 NOTE — Consult Note (Signed)
[facility]   Reason for Consult: Consult for 25 year old woman with a history of substance abuse came into the hospital confused and intoxicated Referring Physician: Roxan Hockeyobinson Patient Identification: Jill HalonBrandy D Shepherd MRN:  [id number] Principal Diagnosis: Amphetamine and psychostimulant-induced psychosis with hallucinations (HCC) Diagnosis:  Principal Problem:   Amphetamine and psychostimulant-induced psychosis with hallucinations (HCC) Active Problems:   Underweight   Anxiety   Opiate abuse, continuous (HCC)   Methamphetamine abuse (HCC)   Bipolar 1 disorder, depressed (HCC)   Substance-induced psychotic disorder (HCC)   RLS (restless legs syndrome)   History of anemia   Total Time spent with patient: 1 hour  Subjective:   Jill HalonBrandy D Shepherd is a 25 y.o. female patient admitted with "I relapsed".  HPI: Patient with a history of substance abuse including opiates and methamphetamine.  Last seen here when she was discharged with a plan to go to outpatient and inpatient rehab.  Patient apparently left her rehab and went back to stay with her boyfriend and has relapsed into drug abuse.  Tells me she has been using methamphetamine regularly.  Denies other drugs.  Currently the patient is a little unclear how she wound up at the hospital.  She denies however having any hallucinations and denies suicidal or homicidal thoughts.  Does not appear to be psychotic or agitated or confused.  Patient's COVID-19 screening test came back positive and she is symptomatic with coughing and sneezing and looking pretty sick in her room.  She has not been compliant with medication outside the hospital.  Past Psychiatric History: Past history of several prior visits to the hospital for substance abuse opiates and amphetamines.  Often developed psychotic symptoms when intoxicated.  Diagnosis of bipolar at times although symptoms seem to be largely correlated with drug use  Risk to Self:   Risk  to Others:   Prior Inpatient Therapy:   Prior Outpatient Therapy:    Past Medical History:  Past Medical History:  Diagnosis Date  . Drug abuse (HCC)   . Headache   . SAB (spontaneous abortion) 01/19/2014   stillborn at 20.4 wks    Past Surgical History:  Procedure Laterality Date  . INCISION AND DRAINAGE ABSCESS Left 05/07/2019   Procedure: INCISION AND DRAINAGE ABSCESS;  Surgeon: Leafy RoPabon, Diego F, MD;  Location: [facility] ORS;  Service: General;  Laterality: Left;   Family History:  Family History  Problem Relation Age of Onset  . Hypertension Father   . Hypertension Paternal Grandfather    Family Psychiatric  History: See previous Social History:  Social History   Substance and Sexual Activity  Alcohol Use No  . Alcohol/week: 0.0 standard drinks     Social History   Substance and Sexual Activity  Drug Use Yes  . Types: Other-see comments, Amphetamines, Fentanyl, Heroin   Comment: subutex tx, snorted heroin last on 5/8    Social History   Socioeconomic History  . Marital status: Legally Separated    Spouse name: Not on file  . Number of children: 1  . Years of education: Not on file  . Highest education level: Not on file  Occupational History    Employer: [employer]  Tobacco Use  . Smoking status: Never Smoker  . Smokeless tobacco: Never Used  Vaping Use  . Vaping Use: Never used  Substance and Sexual Activity  . Alcohol use: No    Alcohol/week: 0.0 standard drinks  . Drug use: Yes    Types: Other-see comments, Amphetamines, Fentanyl, Heroin  Comment: subutex tx, snorted heroin last on 5/8  . Sexual activity: Yes    Partners: Male    Birth control/protection: None  Other Topics Concern  . Not on file  Social History Narrative  . Not on file   Social Determinants of Health   Financial Resource Strain: Not on file  Food Insecurity: Not on file  Transportation Needs: Not on file  Physical Activity: Not on file  Stress: Not on file  Social  Connections: Not on file   Additional Social History:    Allergies:  No Known Allergies  Labs:  Results for orders placed or performed during the hospital encounter of 02/19/20 (from the past 48 hour(s))  Resp Panel by RT-PCR (Flu A&B, Covid) Nasopharyngeal Swab     Status: Abnormal   Collection Time: 02/19/20 12:37 AM   Specimen: Nasopharyngeal Swab; Nasopharyngeal(NP) swabs in vial transport medium  Result Value Ref Range   SARS Coronavirus 2 by RT PCR POSITIVE (A) NEGATIVE    Comment: RESULT CALLED TO, READ BACK BY AND VERIFIED WITH: DR. York Cerise AT 0737 02/19/20. MF (NOTE) SARS-CoV-2 target nucleic acids are DETECTED.  The SARS-CoV-2 RNA is generally detectable in upper respiratory specimens during the acute phase of infection. Positive results are indicative of the presence of the identified virus, but do not rule out bacterial infection or co-infection with other pathogens not detected by the test. Clinical correlation with patient history and other diagnostic information is necessary to determine patient infection status. The expected result is Negative.  Fact Sheet for Patients: BloggerCourse.com  Fact Sheet for Healthcare Providers: SeriousBroker.it  This test is not yet approved or cleared by the [place] FDA and  has been authorized for detection and/or diagnosis of SARS-CoV-2 by FDA under an Emergency Use Authorization (EUA).  This EUA will remain in effect (meaning this test can be u sed) for the duration of  the COVID-19 declaration under Section 564(b)(1) of the Act, 21 U.S.C. section 360bbb-3(b)(1), unless the authorization is terminated or revoked sooner.     Influenza A by PCR NEGATIVE NEGATIVE   Influenza B by PCR NEGATIVE NEGATIVE    Comment: (NOTE) The Xpert Xpress SARS-CoV-2/FLU/RSV plus assay is intended as an aid in the diagnosis of influenza from Nasopharyngeal swab specimens and should not be  used as a sole basis for treatment. Nasal washings and aspirates are unacceptable for Xpert Xpress SARS-CoV-2/FLU/RSV testing.  Fact Sheet for Patients: BloggerCourse.com  Fact Sheet for Healthcare Providers: SeriousBroker.it  This test is not yet approved or cleared by the [place] FDA and has been authorized for detection and/or diagnosis of SARS-CoV-2 by FDA under an Emergency Use Authorization (EUA). This EUA will remain in effect (meaning this test can be used) for the duration of the COVID-19 declaration under Section 564(b)(1) of the Act, 21 U.S.C. section 360bbb-3(b)(1), unless the authorization is terminated or revoked.  Performed at [facility], [address] [place], [place] [address]   Comprehensive metabolic panel     Status: Abnormal   Collection Time: 02/19/20 12:37 AM  Result Value Ref Range   Sodium 143 135 - 145 mmol/L   Potassium 2.7 (LL) 3.5 - 5.1 mmol/L    Comment: CRITICAL RESULT CALLED TO, READ BACK BY AND VERIFIED WITH  CHRIS BUCKNER 02/19/20 0132 ADL    Chloride 104 98 - 111 mmol/L   CO2 22 22 - 32 mmol/L   Glucose, Bld 110 (H) 70 - 99 mg/dL    Comment: Glucose reference  range applies only to samples taken after fasting for at least 8 hours.   BUN 20 6 - 20 mg/dL   Creatinine, Ser 8.11 0.44 - 1.00 mg/dL   Calcium 9.8 8.9 - 91.4 mg/dL   Total Protein 8.0 6.5 - 8.1 g/dL   Albumin 4.6 3.5 - 5.0 g/dL   AST 49 (H) 15 - 41 U/L   ALT 35 0 - 44 U/L   Alkaline Phosphatase 66 38 - 126 U/L   Total Bilirubin 1.2 0.3 - 1.2 mg/dL   GFR, Estimated >78 >29 mL/min    Comment: (NOTE) Calculated using the CKD-EPI Creatinine Equation (2021)    Anion gap 17 (H) 5 - 15    Comment: Performed at [facility], [address] [place], [place] [address]  Ethanol     Status: None   Collection Time: 02/19/20 12:37 AM  Result Value Ref Range   Alcohol, Ethyl (B) <10 <10 mg/dL    Comment:  (NOTE) Lowest detectable limit for serum alcohol is 10 mg/dL.  For medical purposes only. Performed at [facility], [address] [place], [place] [address]   Urine Drug Screen, Qualitative     Status: Abnormal   Collection Time: 02/19/20 12:37 AM  Result Value Ref Range   Tricyclic, Ur Screen POSITIVE (A) NONE DETECTED   Amphetamines, Ur Screen POSITIVE (A) NONE DETECTED   MDMA (Ecstasy)Ur Screen NONE DETECTED NONE DETECTED   Cocaine Metabolite,Ur [place] NONE DETECTED NONE DETECTED   Opiate, Ur Screen NONE DETECTED NONE DETECTED   Phencyclidine (PCP) Ur S NONE DETECTED NONE DETECTED   Cannabinoid 50 Ng, Ur [place] POSITIVE (A) NONE DETECTED   Barbiturates, Ur Screen NONE DETECTED NONE DETECTED   Benzodiazepine, Ur Scrn POSITIVE (A) NONE DETECTED   Methadone Scn, Ur NONE DETECTED NONE DETECTED    Comment: (NOTE) Tricyclics + metabolites, urine    Cutoff 1000 ng/mL Amphetamines + metabolites, urine  Cutoff 1000 ng/mL MDMA (Ecstasy), urine              Cutoff 500 ng/mL Cocaine Metabolite, urine          Cutoff 300 ng/mL Opiate + metabolites, urine        Cutoff 300 ng/mL Phencyclidine (PCP), urine         Cutoff 25 ng/mL Cannabinoid, urine                 Cutoff 50 ng/mL Barbiturates + metabolites, urine  Cutoff 200 ng/mL Benzodiazepine, urine              Cutoff 200 ng/mL Methadone, urine                   Cutoff 300 ng/mL  The urine drug screen provides only a preliminary, unconfirmed analytical test result and should not be used for non-medical purposes. Clinical consideration and professional judgment should be applied to any positive drug screen result due to possible interfering substances. A more specific alternate chemical method must be used in order to obtain a confirmed analytical result. Gas chromatography / mass spectrometry (GC/MS) is the preferred confirm atory method. Performed at [facility], [address] [place], [place] [address]   CBC  with Diff     Status: None   Collection Time: 02/19/20 12:37 AM  Result Value Ref Range   WBC 8.2 4.0 - 10.5 K/uL   RBC 4.22 3.87 - 5.11 MIL/uL   Hemoglobin 13.0 12.0 - 15.0 g/dL   HCT 36.4  36.0 - 46.0 %   MCV 86.3 80.0 - 100.0 fL   MCH 30.8 26.0 - 34.0 pg   MCHC 35.7 30.0 - 36.0 g/dL   RDW 78.2 95.6 - 21.3 %   Platelets 243 150 - 400 K/uL   nRBC 0.0 0.0 - 0.2 %   Neutrophils Relative % 64 %   Neutro Abs 5.3 1.7 - 7.7 K/uL   Lymphocytes Relative 23 %   Lymphs Abs 1.9 0.7 - 4.0 K/uL   Monocytes Relative 11 %   Monocytes Absolute 0.9 0.1 - 1.0 K/uL   Eosinophils Relative 2 %   Eosinophils Absolute 0.2 0.0 - 0.5 K/uL   Basophils Relative 0 %   Basophils Absolute 0.0 0.0 - 0.1 K/uL   Immature Granulocytes 0 %   Abs Immature Granulocytes 0.02 0.00 - 0.07 K/uL    Comment: Performed at [facility], [address] [place], [place] [address]  Salicylate level     Status: Abnormal   Collection Time: 02/19/20 12:37 AM  Result Value Ref Range   Salicylate Lvl <7.0 (L) 7.0 - 30.0 mg/dL    Comment: Performed at [facility], [address] [place], [place] [address]  Acetaminophen level     Status: Abnormal   Collection Time: 02/19/20 12:37 AM  Result Value Ref Range   Acetaminophen (Tylenol), Serum <10 (L) 10 - 30 ug/mL    Comment: (NOTE) Therapeutic concentrations vary significantly. A range of 10-30 ug/mL  may be an effective concentration for many patients. However, some  are best treated at concentrations outside of this range. Acetaminophen concentrations >150 ug/mL at 4 hours after ingestion  and >50 ug/mL at 12 hours after ingestion are often associated with  toxic reactions.  Performed at [facility], [address] [place], [place] [address]   Pregnancy, urine     Status: None   Collection Time: 02/19/20 12:37 AM  Result Value Ref Range   Preg Test, Ur NEGATIVE NEGATIVE    Comment: Performed at [facility], [address] [place], [place] [address]  Magnesium     Status: None   Collection Time: 02/19/20 12:37 AM  Result Value Ref Range   Magnesium 2.1 1.7 - 2.4 mg/dL    Comment: Performed at [facility], [address] [place], [place] [address]  Troponin I (High Sensitivity)     Status: None   Collection Time: 02/19/20 12:37 AM  Result Value Ref Range   Troponin I (High Sensitivity) 4 <18 ng/L    Comment: (NOTE) Elevated high sensitivity troponin I (hsTnI) values and significant  changes across serial measurements may suggest ACS but many other  chronic and acute conditions are known to elevate hsTnI results.  Refer to the "Links" section for chest pain algorithms and additional  guidance. Performed at [facility], [address] [place], [place] [address]     Current Facility-Administered Medications  Medication Dose Route Frequency Provider Last Rate Last Admin  . citalopram (CELEXA) tablet 20 mg  20 mg Oral Daily Mariel Craft, MD   20 mg at 02/20/20 0834  . diphenhydrAMINE (BENADRYL) capsule 50 mg  50 mg Oral Q8H PRN Mariel Craft, MD       Or  . diphenhydrAMINE (BENADRYL) injection 50 mg  50 mg Intramuscular Q6H PRN Mariel Craft, MD   50 mg at 02/19/20 1323  . haloperidol (HALDOL) tablet 10 mg  10 mg Oral Q8H PRN Mariel Craft,  MD       Or  . haloperidol lactate (HALDOL) injection 10 mg  10 mg Intramuscular Q8H PRN Mariel Craft, MD   10 mg at 02/19/20 1322  . hydrOXYzine (ATARAX/VISTARIL) tablet 25 mg  25 mg Oral TID PRN Mariel Craft, MD   25 mg at 02/19/20 1152  . LORazepam (ATIVAN) tablet 2 mg  2 mg Oral Q8H PRN Mariel Craft, MD       Or  . LORazepam (ATIVAN) injection 2 mg  2 mg Intramuscular Q8H PRN Mariel Craft, MD   2 mg at 02/19/20 1323  . multivitamins with iron tablet 1 tablet  1 tablet Oral Daily Mariel Craft, MD   1 tablet at 02/20/20 [phone]  . potassium chloride SA (KLOR-CON) CR tablet 40 mEq  40 mEq Oral Daily Ward, Kristen N,  DO   40 mEq at 02/20/20 0834  . QUEtiapine (SEROQUEL) tablet 200 mg  200 mg Oral QHS Ward, Kristen N, DO   200 mg at 02/19/20 2144   Current Outpatient Medications  Medication Sig Dispense Refill  . citalopram (CELEXA) 20 MG tablet Take 20 mg by mouth daily.    . naloxone (NARCAN) 2 MG/2ML injection Place 1 mg into the nose once.    Marland Kitchen QUEtiapine (SEROQUEL) 100 MG tablet Take 100 mg by mouth at bedtime.      Musculoskeletal: Strength & Muscle Tone: within normal limits Gait & Station: normal Patient leans: N/A  Psychiatric Specialty Exam: Physical Exam Vitals and nursing note reviewed.  Constitutional:      Appearance: She is well-developed and well-nourished.  HENT:     Head: Normocephalic and atraumatic.  Eyes:     Conjunctiva/sclera: Conjunctivae normal.     Pupils: Pupils are equal, round, and reactive to light.  Cardiovascular:     Heart sounds: Normal heart sounds.  Pulmonary:     Effort: Pulmonary effort is normal.  Abdominal:     Palpations: Abdomen is soft.  Musculoskeletal:        General: Normal range of motion.     Cervical back: Normal range of motion.  Skin:    General: Skin is warm and dry.  Neurological:     General: No focal deficit present.     Mental Status: She is alert.  Psychiatric:        Attention and Perception: She is inattentive.        Mood and Affect: Affect is blunt.        Speech: Speech is delayed.        Behavior: Behavior is slowed.        Thought Content: Thought content is not paranoid or delusional. Thought content does not include homicidal or suicidal ideation.        Cognition and Memory: Memory is impaired.        Judgment: Judgment is impulsive.     Review of Systems  Constitutional: Negative.   HENT: Negative.   Eyes: Negative.   Respiratory: Negative.   Cardiovascular: Negative.   Gastrointestinal: Negative.   Musculoskeletal: Negative.   Skin: Negative.   Neurological: Negative.   Psychiatric/Behavioral: Negative.      Blood pressure (!) 94/50, pulse 98, temperature 99 F (37.2 C), temperature source Oral, resp. rate 17, height 5\' 1"  (1.549 m), weight 47.6 kg, SpO2 98 %, unknown if currently breastfeeding.Body mass index is 19.84 kg/m.  General Appearance: Casual  Eye Contact:  Fair  Speech:  Clear and Coherent  Volume:  Normal  Mood:  Euthymic  Affect:  Constricted  Thought Process:  Goal Directed  Orientation:  Full (Time, Place, and Person)  Thought Content:  Logical  Suicidal Thoughts:  No  Homicidal Thoughts:  No  Memory:  Immediate;   Fair Recent;   Fair Remote;   Fair  Judgement:  Fair  Insight:  Fair  Psychomotor Activity:  Decreased  Concentration:  Concentration: Fair  Recall:  FiservFair  Fund of Knowledge:  Fair  Language:  Fair  Akathisia:  No  Handed:  Right  AIMS (if indicated):     Assets:  Desire for Improvement Resilience  ADL's:  Impaired  Cognition:  Impaired,  Mild  Sleep:        Treatment Plan Summary: Medication management and Plan Seroquel patient has sobered up a bit and is denying any suicidal thoughts or any hallucinations.  She is alert and oriented and cooperative.  No longer meets commitment criteria she tells me that she is homeless and has no safe place to stay.  IVC discontinued.  Psychoeducation completed about substance abuse.  Restart Seroquel at night.  Patient will receive a TOC consult for suggestions about disposition plan given homelessness and positive COVID test..  Disposition: No evidence of imminent risk to self or others at present.   Patient does not meet criteria for psychiatric inpatient admission. Supportive therapy provided about ongoing stressors. Discussed crisis plan, support from social network, calling 911, coming to the Emergency Department, and calling Suicide Hotline.  Mordecai RasmussenJohn Clapacs, MD 02/20/2020 2:18 PM

## 2020-02-20 NOTE — ED Provider Notes (Signed)
Emergency Medicine Observation Re-evaluation Note  Jill Shepherd is a 25 y.o. female, seen on rounds today.  Pt initially presented to the ED for complaints of Altered Mental Status and Addiction Problem Currently, the patient is resting comfortably.  Physical Exam  BP 96/68   Pulse 82   Temp 98.5 F (36.9 C) (Oral)   Resp 16   Ht 5\' 1"  (1.549 m)   Wt 47.6 kg   SpO2 97%   BMI 19.84 kg/m  Physical Exam Gen: No acute distress  Resp: Normal rise and fall of chest Neuro: Moving all four extremities Psych: Resting currently, calm and cooperative when awake    ED Course / MDM  EKG:EKG Interpretation  Date/Time:  Sunday February 19 2020 04:57:53 EST Ventricular Rate:  70 PR Interval:  114 QRS Duration: 97 QT Interval:  412 QTC Calculation: 445 R Axis:   66 Text Interpretation: Sinus arrhythmia Borderline short PR interval Borderline Q waves in lateral leads Baseline wander in lead(s) V2 V3 V4 ST and T wave changes in inferior and lateral leads resolved Confirmed by [id number] [phone]) on 02/19/2020 5:12:52 AM    I have reviewed the labs performed to date as well as medications administered while in observation.  Recent changes in the last 24 hours include no acute events overnight.  Plan  Current plan is for psychiatric inpatient treatment. Patient is under full IVC at this time.   Penne Rosenstock, 02/21/2020, DO 02/20/20 [phone]

## 2020-02-20 NOTE — Telephone Encounter (Signed)
Called to discuss with patient about COVID-19 symptoms and the use of one of the available treatments for those with mild to moderate Covid symptoms and at a high risk of hospitalization.  Pt appears to qualify for outpatient treatment due to co-morbid conditions and/or a member of an at-risk group in accordance with the FDA Emergency Use Authorization.   No outreach call due to patient has been admitted to hospital.     Karsten Fells
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
## 2020-02-20 NOTE — ED Notes (Signed)
Pt given lunch tray.

## 2020-02-20 NOTE — ED Provider Notes (Signed)
Cleared by psychiatric team for discharge home.  Her potassium is already corrected while here.     Concha Se, MD 02/20/20 [phone]

## 2020-02-20 NOTE — ED Notes (Signed)
Pt given dinner tray.

## 2020-02-20 NOTE — ED Notes (Signed)
Pt received breakfast tray and continued to sleep through most of our interaction.

## 2020-02-20 NOTE — ED Notes (Signed)
Pt not happy about being discharged without a place to stay. CSW, Consulting civil engineer, EDP notified of situation. It was decided to discharge pt.   Pt c/o missing her phone. Pt instructed to look through belongings again. Did not find it. I called patient relations on lobby to come see pt about phone. On my return to room pt was gone. It was assumed that pt went to lobby. Pt relations in lobby called to be on lookout for pt incase pt needed help.

## 2020-02-21 MED ORDER — QUETIAPINE FUMARATE 25 MG PO TABS
100.0000 mg | ORAL_TABLET | Freq: Every day | ORAL | Status: DC
  Administered 2020-02-21 – 2020-02-23 (×3): 100 mg via ORAL
  Filled 2020-02-21 (×3): qty 4

## 2020-02-21 MED ORDER — POTASSIUM CHLORIDE 20 MEQ PO PACK
40.0000 meq | PACK | Freq: Every day | ORAL | Status: DC
  Administered 2020-02-21 – 2020-02-24 (×4): 40 meq via ORAL
  Filled 2020-02-21 (×4): qty 2

## 2020-02-21 MED ORDER — CITALOPRAM HYDROBROMIDE 20 MG PO TABS
20.0000 mg | ORAL_TABLET | Freq: Every day | ORAL | Status: DC
  Administered 2020-02-21 – 2020-02-24 (×4): 20 mg via ORAL
  Filled 2020-02-21 (×4): qty 1

## 2020-02-21 NOTE — Progress Notes (Signed)
Jill Shepherd is a 25 y.o. female who checked back into the ED due to homelessness. The patient was seen and cleared by psychiatry for substance abuse and SI without a plan. The patient was seen by Dr. Toni Amend less than 24 hours, and he psych cleared her. Therefore, the patient will not be psychiatrically assessed by this Clinical research associate.

## 2020-02-21 NOTE — ED Notes (Signed)
Pt reports feeling stressed because she is homeless and has no one to help her   Pt discharged from hospital today.  Pt came back because her family won't help her  Pt denies drug or etoh use now.  Pt calm and cooperative.

## 2020-02-21 NOTE — ED Notes (Signed)
Dinner tray given with apple juice

## 2020-02-21 NOTE — ED Notes (Signed)
VOL  PENDING  PLACEMENT 

## 2020-02-21 NOTE — ED Notes (Signed)
Pt sleeping. 

## 2020-02-21 NOTE — ED Notes (Signed)
Report to include Situation, Background, Assessment, and Recommendations received from Jane RN. Patient alert and oriented, warm and dry, in no acute distress. Patient denies SI, HI, AVH and pain. Patient made aware of Q15 minute rounds and security cameras for their safety. Patient instructed to come to me with needs or concerns.  

## 2020-02-21 NOTE — TOC Initial Note (Signed)
Transition of Care [facility]) - Initial/Assessment Note    Patient Details  Name: Jill Shepherd MRN: [id number] Date of Birth: 05-26-1995  Transition of Care [facility]) CM/SW Contact:    Marina Goodell Phone Number:  [phone] 02/21/2020, 6:29 PM  Clinical Narrative:                  Patient was discharged from [facility] ED yesterday afternoon.  Patient returned to [facility] ED five hours later stating she was experiencing S/I and homelessness. Patent is COVID+.  Patient's mother Jill Shepherd [phone] contacted this CSW this morning asking if the patient had left yesterday.  CSW stated the patient had beed discharged but had returned to the ED a few hours later.  Ms. Debara Pickett stated the patient needed help for her addiction to meth, "She just got out of rehab, ya'll need to help her get her life back together."  CSW explained dot Ms. Buntin that if the patient had a medical need she would stay in the ED but if she did not have a medical need we would need to discharge her.     Barriers to Discharge: [facility] Out[facility] Resources,Financial Resources,Homeless with medical needs,Inadequate or no insurance,Barriers Unresolved (comment),ED Uninsured needing PCP establishment,ED Uninsured needing medication assistance,ED Transportation,ED PCP establishment,ED Medication assistance,ED Facility/Family Refusing to Allow Patient to Return   Patient Goals and CMS Choice        Expected Discharge Plan and Services                                                Prior Living Arrangements/Services                       Activities of Daily Living      Permission Sought/Granted                  Emotional Assessment              Admission diagnosis:  Mental Eval Patient Active Problem List   Diagnosis Date Noted  . 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 RLS (restless legs syndrome) 02/19/2020  . History of anemia 02/19/2020  .  Bipolar 1 disorder, depressed (HCC) 12/07/2019  . 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 Bipolar 1 disorder (HCC) 05/13/2019  . Opiate abuse, continuous (HCC) 05/07/2019  . 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 Sepsis (HCC) 05/06/2019  . Abscess-multpile sites 05/06/2019  . Anxiety 08/04/2017  . Postpartum depression 08/04/2017  . SVD (spontaneous vaginal delivery) 07/17/2017  . History of preterm delivery 11/28/2016  . Pregnancy complicated by subutex maintenance, antepartum (HCC) 11/28/2016  . History of preterm premature rupture of membranes (PPROM) 09/25/2015  . History of pregnancy loss in prior pregnancy, currently pregnant 05/14/2015  . History of drug use 05/14/2015  . Underweight 05/14/2015   PCP:  Pcp, No Pharmacy:   [facility] [address] [place] - [address] [place] [address] Phone: [phone] Fax: [phone]  [facility] - [place], [place] - [address] [place] [address] Phone: [phone] Fax: [phone]  [facility] - [place], [place] - [address] [place] [address] Phone: [phone] Fax: [phone]  [facility] [phone] -  [place], [place] - [id number] [place] [address] Phone: [phone] Fax: [phone]     Social Determinants of Health (SDOH) Interventions    Readmission Risk Interventions No flowsheet data found.

## 2020-02-21 NOTE — ED Notes (Signed)
Hourly rounding reveals patient in room. No complaints, stable, in no acute distress. Q15 minute rounds and monitoring via Security Cameras to continue. 

## 2020-02-21 NOTE — ED Notes (Signed)
Snack and beverage given. 
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
## 2020-02-21 NOTE — BH Assessment (Signed)
Patient has been Psyc cleared as of 02/20/20 per Dr. Toni Amend note on 02/20/20. Patient currently has a TOC consult to assist with any other needs. TTS currently not needed at this time.

## 2020-02-21 NOTE — ED Provider Notes (Signed)
Patient is coronavirus positive. I have ordered 40 of KCl for her since her potassium is low.   Arnaldo Natal, MD 02/21/20 [phone]

## 2020-02-21 NOTE — ED Notes (Signed)
Gave lunch tray with sprite. 

## 2020-02-22 DIAGNOSIS — Z5900 Homelessness unspecified: Secondary | ICD-10-CM

## 2020-02-22 DIAGNOSIS — F19959 Other psychoactive substance use, unspecified with psychoactive substance-induced psychotic disorder, unspecified: Secondary | ICD-10-CM | POA: Diagnosis not present

## 2020-02-22 DIAGNOSIS — U071 COVID-19: Secondary | ICD-10-CM

## 2020-02-22 MED ORDER — ACETAMINOPHEN 500 MG PO TABS
1000.0000 mg | ORAL_TABLET | Freq: Four times a day (QID) | ORAL | Status: DC | PRN
  Administered 2020-02-23: 1000 mg via ORAL
  Filled 2020-02-22: qty 2

## 2020-02-22 MED ORDER — ACETAMINOPHEN 500 MG PO TABS
ORAL_TABLET | ORAL | Status: AC
  Administered 2020-02-22: 1000 mg via ORAL
  Filled 2020-02-22: qty 2

## 2020-02-22 NOTE — ED Provider Notes (Signed)
Emergency Medicine Observation Re-evaluation Note  Jill Shepherd is a 25 y.o. female, seen on rounds today.  Pt initially presented to the ED for complaints of Behavioral Health  Currently, the patient is resting.  Physical Exam  BP 98/64 (BP Location: Left Arm)   Pulse 69   Temp 98.2 F (36.8 C) (Oral)   Resp 16   Ht 5\' 1"  (1.549 m)   Wt 47.6 kg   SpO2 99%   BMI 19.83 kg/m  Physical Exam General: nad Cardiac: well perfused Lungs: even and unlabored Psych: calm  ED Course / MDM  EKG:  Clinical Course as of 02/22/20 0806  Tue Feb 21, 2020  0546 No events overnight.  Patient remains in the ED awaiting clinical social work consult this morning. [JS]    Clinical Course User Index [JS] Feb 23, 2020, MD   I have reviewed the labs performed to date as well as medications administered while in observation.  Recent changes in the last 24 hours include none.  Plan  Current plan is for SW dispo. Patient is not under full IVC at this time.   Irean Hong, MD 02/22/20 [phone]

## 2020-02-22 NOTE — ED Notes (Signed)

## 2020-02-22 NOTE — Consult Note (Signed)
[facility] Face-to-Face Psychiatry Consult   Reason for Consult: Consult for 25 year old woman who came back into the hospital almost immediately after previous discharge once again reporting suicidal ideation Referring Physician: Roxan Hockey Patient Identification: Jill Shepherd MRN:  [id number] Principal Diagnosis: Substance-induced psychotic disorder [facility]) Diagnosis:  Principal Problem:   Substance-induced psychotic disorder (HCC) Active Problems:   Amphetamine and psychostimulant-induced psychosis with hallucinations (HCC)   COVID-19   Homelessness   Total Time spent with patient: 45 minutes  Subjective:   Jill Shepherd is a 25 y.o. female patient admitted with "I just need some rehab".  HPI: Patient seen chart reviewed.  Patient had been psych cleared and discharged from the emergency room.  This was at night and she has no place to stay and was suffering from symptomatic COVID.  She came straight back into the emergency room voicing suicidal ideation.  On interview today patient denies any acute suicidal intent or plan.  Denies psychotic symptoms.  States that she has no place to live and no resources to get back into sobriety.  COVID symptoms are improving.  Still fatigued and a little congested but not extremely sick not febrile not delirious  Past Psychiatric History: Past history of longstanding problems with substance abuse including amphetamine abuse.  Mood disorder possible bipolar  Risk to Self:   Risk to Others:   Prior Inpatient Therapy:   Prior Outpatient Therapy:    Past Medical History:  Past Medical History:  Diagnosis Date  . Drug abuse (HCC)   . Headache   . SAB (spontaneous abortion) 01/19/2014   stillborn at 20.4 wks    Past Surgical History:  Procedure Laterality Date  . INCISION AND DRAINAGE ABSCESS Left 05/07/2019   Procedure: INCISION AND DRAINAGE ABSCESS;  Surgeon: Leafy Ro, MD;  Location: [facility] ORS;  Service: General;  Laterality: Left;   Family  History:  Family History  Problem Relation Age of Onset  . Hypertension Father   . Hypertension Paternal Grandfather    Family Psychiatric  History: See previous Social History:  Social History   Substance and Sexual Activity  Alcohol Use No  . Alcohol/week: 0.0 standard drinks     Social History   Substance and Sexual Activity  Drug Use Yes  . Types: Other-see comments, Amphetamines, Fentanyl, Heroin   Comment: subutex tx, snorted heroin last on 5/8    Social History   Socioeconomic History  . Marital status: Legally Separated    Spouse name: Not on file  . Number of children: 1  . Years of education: Not on file  . Highest education level: Not on file  Occupational History    Employer: [employer]  Tobacco Use  . Smoking status: Never Smoker  . Smokeless tobacco: Never Used  Vaping Use  . Vaping Use: Never used  Substance and Sexual Activity  . Alcohol use: No    Alcohol/week: 0.0 standard drinks  . Drug use: Yes    Types: Other-see comments, Amphetamines, Fentanyl, Heroin    Comment: subutex tx, snorted heroin last on 5/8  . Sexual activity: Yes    Partners: Male    Birth control/protection: None  Other Topics Concern  . Not on file  Social History Narrative  . Not on file   Social Determinants of Health   Financial Resource Strain: Not on file  Food Insecurity: Not on file  Transportation Needs: Not on file  Physical Activity: Not on file  Stress: Not on file  Social Connections:  Not on file   Additional Social History:    Allergies:  No Known Allergies  Labs:  Results for orders placed or performed during the hospital encounter of 02/20/20 (from the past 48 hour(s))  Urine Drug Screen, Qualitative     Status: Abnormal   Collection Time: 02/20/20 10:29 PM  Result Value Ref Range   Tricyclic, Ur Screen POSITIVE (A) NONE DETECTED   Amphetamines, Ur Screen POSITIVE (A) NONE DETECTED   MDMA (Ecstasy)Ur Screen NONE DETECTED NONE DETECTED   Cocaine  Metabolite,Ur [place] NONE DETECTED NONE DETECTED   Opiate, Ur Screen NONE DETECTED NONE DETECTED   Phencyclidine (PCP) Ur S NONE DETECTED NONE DETECTED   Cannabinoid 50 Ng, Ur [place] NONE DETECTED NONE DETECTED   Barbiturates, Ur Screen NONE DETECTED NONE DETECTED   Benzodiazepine, Ur Scrn POSITIVE (A) NONE DETECTED   Methadone Scn, Ur NONE DETECTED NONE DETECTED    Comment: (NOTE) Tricyclics + metabolites, urine    Cutoff 1000 ng/mL Amphetamines + metabolites, urine  Cutoff 1000 ng/mL MDMA (Ecstasy), urine              Cutoff 500 ng/mL Cocaine Metabolite, urine          Cutoff 300 ng/mL Opiate + metabolites, urine        Cutoff 300 ng/mL Phencyclidine (PCP), urine         Cutoff 25 ng/mL Cannabinoid, urine                 Cutoff 50 ng/mL Barbiturates + metabolites, urine  Cutoff 200 ng/mL Benzodiazepine, urine              Cutoff 200 ng/mL Methadone, urine                   Cutoff 300 ng/mL  The urine drug screen provides only a preliminary, unconfirmed analytical test result and should not be used for non-medical purposes. Clinical consideration and professional judgment should be applied to any positive drug screen result due to possible interfering substances. A more specific alternate chemical method must be used in order to obtain a confirmed analytical result. Gas chromatography / mass spectrometry (GC/MS) is the preferred confirm atory method. Performed at [facility], [address] [place], [place] [address]     Current Facility-Administered Medications  Medication Dose Route Frequency Provider Last Rate Last Admin  . citalopram (CELEXA) tablet 20 mg  20 mg Oral Daily Gillermo Murdoch, NP   20 mg at 02/22/20 1136  . potassium chloride (KLOR-CON) packet 40 mEq  40 mEq Oral Daily Arnaldo Natal, MD   40 mEq at 02/22/20 1136  . QUEtiapine (SEROQUEL) tablet 100 mg  100 mg Oral QHS Gillermo Murdoch, NP   100 mg at 02/21/20 2107   Current Outpatient Medications   Medication Sig Dispense Refill  . citalopram (CELEXA) 20 MG tablet Take 20 mg by mouth daily.    . naloxone (NARCAN) 2 MG/2ML injection Place 1 mg into the nose once.    Marland Kitchen QUEtiapine (SEROQUEL) 100 MG tablet Take 100 mg by mouth at bedtime.      Musculoskeletal: Strength & Muscle Tone: within normal limits Gait & Station: normal Patient leans: N/A  Psychiatric Specialty Exam: Physical Exam Vitals and nursing note reviewed.  Constitutional:      Appearance: She is well-developed and well-nourished.  HENT:     Head: Normocephalic and atraumatic.  Eyes:     Conjunctiva/sclera: Conjunctivae normal.     Pupils: Pupils are  equal, round, and reactive to light.  Cardiovascular:     Heart sounds: Normal heart sounds.  Pulmonary:     Effort: Pulmonary effort is normal.  Abdominal:     Palpations: Abdomen is soft.  Musculoskeletal:        General: Normal range of motion.     Cervical back: Normal range of motion.  Skin:    General: Skin is warm and dry.  Neurological:     General: No focal deficit present.     Mental Status: She is alert.  Psychiatric:        Attention and Perception: Attention normal.        Mood and Affect: Affect is blunt.        Speech: Speech normal.        Behavior: Behavior is slowed.        Thought Content: Thought content is not paranoid. Thought content does not include homicidal or suicidal ideation.        Cognition and Memory: Cognition normal.        Judgment: Judgment normal.     Review of Systems  Constitutional: Positive for fatigue.  HENT: Negative.   Eyes: Negative.   Respiratory: Negative.   Cardiovascular: Negative.   Gastrointestinal: Negative.   Musculoskeletal: Negative.   Skin: Negative.   Neurological: Negative.   Psychiatric/Behavioral: Negative for suicidal ideas.    Blood pressure 101/60, pulse 72, temperature 98.2 F (36.8 C), temperature source Oral, resp. rate 17, height 5\' 1"  (1.549 m), weight 47.6 kg, SpO2 98 %,  unknown if currently breastfeeding.Body mass index is 19.83 kg/m.  General Appearance: Casual  Eye Contact:  Good  Speech:  Slow  Volume:  Decreased  Mood:  Dysphoric  Affect:  Congruent  Thought Process:  Goal Directed  Orientation:  Full (Time, Place, and Person)  Thought Content:  Logical  Suicidal Thoughts:  No  Homicidal Thoughts:  No  Memory:  Immediate;   Fair Recent;   Fair Remote;   Fair  Judgement:  Fair  Insight:  Fair  Psychomotor Activity:  Normal  Concentration:  Concentration: Fair  Recall:  of Knowledge:  Fair  Language:  Fair  Akathisia:  No  Handed:  Right  AIMS (if indicated):     Assets:  Desire for Improvement Resilience  ADL's:  Intact  Cognition:  Impaired,  Mild  Sleep:        Treatment Plan Summary: Plan Patient continues to be passive and homeless.  Not suicidal.  No indication for admission to psychiatric ward.  Still recovering from COVID.  TOC consult will be placed again requesting any help possible to refer patient to homeless resources.  Disposition: No evidence of imminent risk to self or others at present.   Patient does not meet criteria for psychiatric inpatient admission. Supportive therapy provided about ongoing stressors. Discussed crisis plan, support from social network, calling 911, coming to the Emergency Department, and calling Suicide Hotline.  [employer], MD 02/22/2020 2:04 PM

## 2020-02-22 NOTE — ED Notes (Signed)
Pt sleeping when lunch brought into her.  Awoke and stated she wasn't hungry but placed tray next to be.  Pt back to sleep, cooperative

## 2020-02-22 NOTE — ED Notes (Signed)
Hourly rounding reveals patient in room. No complaints, stable, in no acute distress. Q15 minute rounds and monitoring via Security Cameras to continue. 

## 2020-02-22 NOTE — ED Notes (Signed)
VS not taken, Patient asleep. 

## 2020-02-23 NOTE — ED Notes (Signed)
Vol /pending placement 

## 2020-02-23 NOTE — ED Notes (Signed)
Hourly rounding completed at this time, patient currently awake in dayroom. No complaints, stable, and in no acute distress. Q15 minute rounds and monitoring via Security Cameras to continue. 

## 2020-02-23 NOTE — ED Notes (Signed)
Hourly rounding completed at this time, patient currently awake in room. No complaints, stable, and in no acute distress. Q15 minute rounds and monitoring via Security Cameras to continue. 

## 2020-02-23 NOTE — ED Notes (Signed)
Hourly rounding completed at this time, patient currently asleep in room. No complaints, stable, and in no acute distress. Q15 minute rounds and monitoring via Security Cameras to continue. 

## 2020-02-23 NOTE — ED Notes (Signed)
Dinner provided.

## 2020-02-23 NOTE — ED Notes (Signed)
Requests meds for headache, denies any other symptoms at this time.

## 2020-02-23 NOTE — ED Notes (Signed)
Patient out of bed requesting shower supplies. Oral hygiene supplies and clothe clothes provided.

## 2020-02-23 NOTE — ED Notes (Signed)
Report received from [facility] including  Situation, Background, Assessment, and Recommendations. Patient alert and oriented, warm and dry, in no acute distress. Patient denies SI, HI, AVH and pain. Patient made aware of Q15 minute rounds and security cameras for their safety. Patient instructed to come to this nurse with needs or concerns.

## 2020-02-23 NOTE — ED Provider Notes (Signed)
Emergency Medicine Observation Re-evaluation Note  Jill Shepherd is a 25 y.o. female, seen on rounds today.  Pt initially presented to the ED for complaints of Behavioral Health   Currently, the patient is is no acute distress. Pt resting.   Physical Exam  Blood pressure 101/60, pulse 72, temperature 98.2 F (36.8 C), temperature source Oral, resp. rate 17, height 5\' 1"  (1.549 m), weight 47.6 kg, SpO2 98 %, unknown if currently breastfeeding.  Physical Exam General: No apparent distress HEENT: moist mucous membranes CV: RRR Pulm: Normal WOB GI: soft and non tender MSK: no edema or cyanosis Neuro: face symmetric, moving all extremities     ED Course / MDM   Clinical Course as of 02/23/20 0733  Tue Feb 21, 2020  0546 No events overnight.  Patient remains in the ED awaiting clinical social work consult this morning. [JS]    Clinical Course User Index [JS] Feb 23, 2020, MD    I have reviewed the labs performed to date as well as medications administered while in observation.  Recent changes in the last 24 hours include   Plan   Current plan is to continue to wait for psych plan/placement if felt warranted. Did not meet criteria so now penidng SW.   Patient is not under full IVC at this time.   Irean Hong, MD 02/23/20 [phone]

## 2020-02-24 NOTE — ED Provider Notes (Signed)
Emergency Medicine Observation Re-evaluation Note  Jill Shepherd is a 25 y.o. female, seen on rounds today.  Pt initially presented to the ED for complaints of Behavioral Health  Currently, the patient is calm, cooperative.  Physical Exam  BP 101/66   Pulse 62   Temp 98.4 F (36.9 C) (Oral)   Resp 18   Ht 5\' 1"  (1.549 m)   Wt 47.6 kg   SpO2 97%   BMI 19.83 kg/m  Physical Exam General: NAD Cardiac: well eprfused Lungs: normal WOB, no distress Psych: calm  ED Course / MDM  EKG:  Clinical Course as of 02/24/20 0744  Tue Feb 21, 2020  0546 No events overnight.  Patient remains in the ED awaiting clinical social work consult this morning. [JS]    Clinical Course User Index [JS] Feb 23, 2020, MD   I have reviewed the labs performed to date as well as medications administered while in observation.  Recent changes in the last 24 hours include none.  Plan  Current plan is for psych disposition/admission. Patient is not under full IVC at this time.   Irean Hong, MD 02/24/20 [phone]

## 2020-02-24 NOTE — ED Notes (Signed)
esignature page not working on mobile computer at time of discharge 

## 2020-02-24 NOTE — ED Notes (Signed)
Hourly rounding completed at this time, patient currently asleep in room. No complaints, stable, and in no acute distress. Q15 minute rounds and monitoring via Security Cameras to continue. 

## 2020-02-24 NOTE — ED Notes (Signed)
She is sleeping  I will obtain VS when she awakens

## 2020-02-24 NOTE — ED Notes (Signed)
Pt asleep at this time, unable to collect vitals. Will collect pt vitals once awake. 

## 2020-02-24 NOTE — ED Notes (Signed)
Pt asleep, will give lunch when pt awakens.

## 2020-02-24 NOTE — ED Notes (Signed)
VOL/pending placement 

## 2020-02-24 NOTE — TOC Progression Note (Signed)
Transition of Care [facility]) - Progression Note    Patient Details  Name: Jill Shepherd MRN: [id number] Date of Birth: 09-03-1995  Transition of Care [facility]) CM/SW Contact  Marina Goodell Phone Number:  [phone] 02/24/2020, 12:12 PM  Clinical Narrative:     CSW unable to find homeless shelter placement for patient due to COVID+ status. Once patient is no longer in quarantine status patient can be placed.  Patient is medically and psychiatrically cleared and can d/c at anytime if she no longer wants to stay in the hospital.  Patient can also contact friends or family and request assistance from them.  CSW did speak with patient's mother Ms. Buntin and she has refused to allow patient to return to the house.    Barriers to Discharge: [facility] Behavioral Health Outpatient Resources,Financial Resources,Homeless with medical needs,Inadequate or no insurance,Barriers Unresolved (comment),ED Uninsured needing PCP establishment,ED Uninsured needing medication assistance,ED Transportation,ED PCP establishment,ED Medication assistance,ED Facility/Family Refusing to Allow Patient to Return  Expected Discharge Plan and Services                                                 Social Determinants of Health (SDOH) Interventions    Readmission Risk Interventions No flowsheet data found.

## 2020-05-10 ENCOUNTER — Encounter: Payer: Self-pay | Admitting: Emergency Medicine

## 2020-05-10 ENCOUNTER — Emergency Department
Admission: EM | Admit: 2020-05-10 | Discharge: 2020-05-10 | Disposition: A | Discharge status: Home / Self Care | Payer: Self-pay | Attending: Emergency Medicine | Admitting: Emergency Medicine

## 2020-05-10 ENCOUNTER — Emergency Department: Payer: Self-pay

## 2020-05-10 ENCOUNTER — Other Ambulatory Visit: Payer: Self-pay

## 2020-05-10 DIAGNOSIS — S60212A Contusion of left wrist, initial encounter: Secondary | ICD-10-CM | POA: Insufficient documentation

## 2020-05-10 DIAGNOSIS — T7421XA Adult sexual abuse, confirmed, initial encounter: Secondary | ICD-10-CM | POA: Insufficient documentation

## 2020-05-10 DIAGNOSIS — R059 Cough, unspecified: Secondary | ICD-10-CM | POA: Insufficient documentation

## 2020-05-10 DIAGNOSIS — Z8616 Personal history of COVID-19: Secondary | ICD-10-CM | POA: Insufficient documentation

## 2020-05-10 DIAGNOSIS — F17210 Nicotine dependence, cigarettes, uncomplicated: Secondary | ICD-10-CM | POA: Insufficient documentation

## 2020-05-10 DIAGNOSIS — M25532 Pain in left wrist: Secondary | ICD-10-CM

## 2020-05-10 LAB — POC URINE PREG, ED: Preg Test, Ur: NEGATIVE

## 2020-05-10 MED ORDER — NAPROXEN 500 MG PO TABS: 500.0000 mg | ORAL_TABLET | Freq: Once | ORAL | Status: DC

## 2020-05-10 MED ORDER — ACETAMINOPHEN 500 MG PO TABS: 1000.0000 mg | ORAL_TABLET | Freq: Once | ORAL | Status: DC

## 2020-05-10 NOTE — ED Notes (Signed)
Pt states she "just wants to leave because I found a ride"
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
## 2020-05-10 NOTE — ED Notes (Signed)
SANE nurse at bedside.  MD aware of pt wanting to leave at this time and will speak to pt
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
## 2021-08-27 ENCOUNTER — Ambulatory Visit: Payer: Medicaid Other

## 2021-11-14 IMAGING — DX DG CHEST 1V PORT
1 series · 1 of 1 positions shown · non-contrast
Comparison: None.

CLINICAL DATA: Fever, altered level of consciousness,
hallucinations

EXAM:
PORTABLE CHEST 1 VIEW

[chest ap]
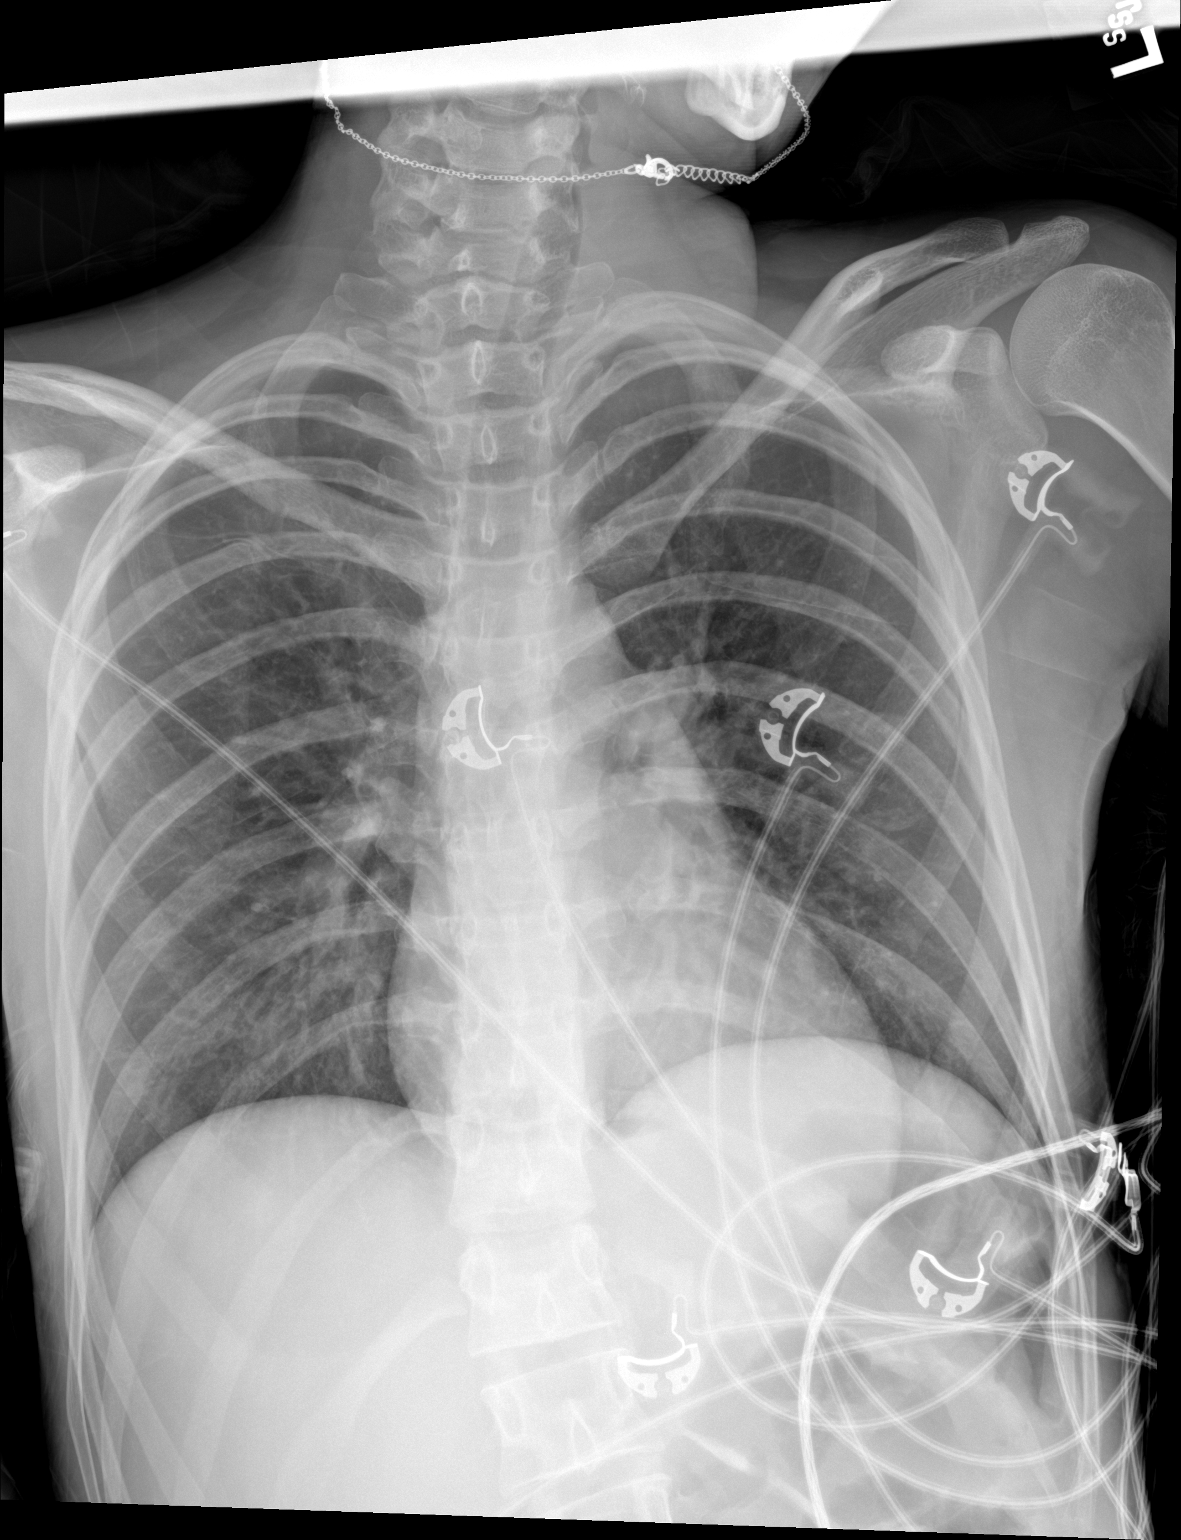

[1 of 1 positions shown; findings below may reference images not displayed]

FINDINGS: The heart size and mediastinal contours are within normal limits.
Both lungs are clear. The visualized skeletal structures are
unremarkable.
IMPRESSION: No active disease.
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
## 2022-07-18 ENCOUNTER — Emergency Department
Admission: EM | Admit: 2022-07-18 | Discharge: 2022-07-18 | Disposition: A | Discharge status: Home / Self Care | Payer: No Typology Code available for payment source | Attending: Emergency Medicine | Admitting: Emergency Medicine

## 2022-07-18 DIAGNOSIS — F15188 Other stimulant abuse with other stimulant-induced disorder: Secondary | ICD-10-CM | POA: Insufficient documentation

## 2022-07-18 DIAGNOSIS — R4182 Altered mental status, unspecified: Secondary | ICD-10-CM

## 2022-07-18 DIAGNOSIS — F1510 Other stimulant abuse, uncomplicated: Secondary | ICD-10-CM

## 2022-07-18 NOTE — ED Notes (Signed)
Mother was called for a ride; she states she will call back if she is able.

## 2022-07-18 NOTE — ED Notes (Signed)
Pt and RN spoke with Pt's mom. Mom asked if she over dosed. RN informed Mom that pt's vitals are within normal limits and that she was able to be discharged to a responsible person. Pt's mom said that she will call back to say when she could pick her up. Provider notified.

## 2022-07-18 NOTE — ED Notes (Addendum)
Pt was given snack. 

## 2022-07-18 NOTE — ED Notes (Signed)
Pts ride, Jill Shepherd is here, MD made aware, IVC to be rescinded and pt d/c'd
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
## 2022-07-18 NOTE — ED Notes (Signed)
Provider notified about pt walking out. RN able to redirect pt back to bed. Provider notified.
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
## 2022-07-18 NOTE — ED Notes (Signed)
Pt on phone 

## 2022-07-18 NOTE — ED Provider Notes (Signed)
[facility] Provider Note   Event Date/Time   First MD Initiated Contact with Patient 07/18/22 1405     (approximate) History  Altered Mental Status  HPI Jill Shepherd is a 27 y.o. female who presents from urgent care via EMS due to altered mental status secondary to methamphetamine use.  Patient admits to methamphetamine use.  Patient denies any other complaints at this time.  Patient states that she is intermittently hearing her mother's voice.  Patient denies any other complaints. ROS: Patient currently denies any vision changes, tinnitus, difficulty speaking, facial droop, sore throat, chest pain, shortness of breath, abdominal pain, nausea/vomiting/diarrhea, dysuria, or weakness/numbness/paresthesias in any extremity   Physical Exam  Triage Vital Signs: ED Triage Vitals  Enc Vitals Group     BP 07/18/22 1352 (!) 132/94     Pulse Rate 07/18/22 1352 100     Resp 07/18/22 1352 18     Temp --      Temp src --      SpO2 07/18/22 1352 100 %     Weight 07/18/22 1353 100 lb (45.4 kg)     Height 07/18/22 1353 5\' 2"  (1.575 m)     Head Circumference --      Peak Flow --      Pain Score 07/18/22 1353 0     Pain Loc --      Pain Edu? --      Excl. in GC? --    Most recent vital signs: Vitals:   07/18/22 1352  BP: (!) 132/94  Pulse: 100  Resp: 18  SpO2: 100%   General: Awake, oriented x4. CV:  Good peripheral perfusion.  Resp:  Normal effort.  Abd:  No distention.  Other:  Young adult cachectic disheveled Caucasian female sitting on stretcher intermittently reacting to internal stimuli ED Results / Procedures / Treatments  Labs (all labs ordered are listed, but only abnormal results are displayed) Labs Reviewed - No data to display PROCEDURES: Critical Care performed: No Procedures MEDICATIONS ORDERED IN ED: Medications - No data to display IMPRESSION / MDM / ASSESSMENT AND PLAN / ED COURSE  I reviewed the triage vital signs and the nursing  notes.                             The patient is on the cardiac monitor to evaluate for evidence of arrhythmia and/or significant heart rate changes. Patient's presentation is most consistent with acute presentation with potential threat to life or bodily function. +agitation +diaphoresis +mydriasis +tachycardia Questionable sympathomimetic toxicity.  Airway maintained. I have low suspicion for thyroid storm, malignant hyperthermia, serotonin syndrome, anticholinergic toxicity, NMS, sepsis, hypothyroidism, ASA, ICH.  Workup: POCT glucose, CBC, CMP, CK EKG to check for prolongation of QTc or QRS intervals. Interventions: Agitation not controlled in hard restraints. Lorazepam 2mg  IM  Disposition: Care of this patient will be signed out to the oncoming physician at the end of my shift.  All pertinent patient information conveyed and all questions answered.  All further care and disposition decisions will be made by the oncoming physician.   FINAL CLINICAL IMPRESSION(S) / ED DIAGNOSES   Final diagnoses:  Methamphetamine abuse (HCC)   Rx / DC Orders   ED Discharge Orders     None      Note:  This document was prepared using Dragon voice recognition software and may include unintentional dictation errors.   Merwyn Katos,  MD 07/18/22 1447

## 2022-07-18 NOTE — ED Provider Notes (Addendum)
Patient seen and cleared. She is now awake and alert. Contracted for safety. Will be sent home with family.   Shaune Pollack, MD 07/18/22 Janetta Hora    Shaune Pollack, MD 07/18/22 [phone]
